# Patient Record
Sex: Female | Born: 1955 | Race: Black or African American | Hispanic: No | Marital: Single | Smoking: Current every day smoker
Health system: Southern US, Community
[De-identification: ages and names within clinical notes are randomized; demographics above are authoritative.]

## PROBLEM LIST (undated history)

## (undated) DIAGNOSIS — I10 Essential (primary) hypertension: Secondary | ICD-10-CM

## (undated) DIAGNOSIS — E785 Hyperlipidemia, unspecified: Secondary | ICD-10-CM

## (undated) DIAGNOSIS — E669 Obesity, unspecified: Secondary | ICD-10-CM

## (undated) DIAGNOSIS — E119 Type 2 diabetes mellitus without complications: Secondary | ICD-10-CM

## (undated) HISTORY — PX: ABDOMINAL HYSTERECTOMY: SHX81

## (undated) HISTORY — DX: Hyperlipidemia, unspecified: E78.5

## (undated) HISTORY — DX: Essential (primary) hypertension: I10

## (undated) HISTORY — DX: Obesity, unspecified: E66.9

## (undated) HISTORY — DX: Type 2 diabetes mellitus without complications: E11.9

---

## 2007-01-21 ENCOUNTER — Emergency Department (HOSPITAL_COMMUNITY): Admission: EM | Admit: 2007-01-21 | Discharge: 2007-01-21 | Payer: Self-pay | Admitting: Emergency Medicine

## 2007-12-22 ENCOUNTER — Emergency Department (HOSPITAL_COMMUNITY): Admission: EM | Admit: 2007-12-22 | Discharge: 2007-12-22 | Payer: Self-pay | Admitting: Family Medicine

## 2011-01-03 ENCOUNTER — Inpatient Hospital Stay (INDEPENDENT_AMBULATORY_CARE_PROVIDER_SITE_OTHER)
Admission: RE | Admit: 2011-01-03 | Discharge: 2011-01-03 | Disposition: A | Discharge status: Home / Self Care | Payer: Self-pay | Source: Ambulatory Visit | Attending: Family Medicine | Admitting: Family Medicine

## 2011-01-03 DIAGNOSIS — I10 Essential (primary) hypertension: Secondary | ICD-10-CM

## 2011-01-03 DIAGNOSIS — S139XXA Sprain of joints and ligaments of unspecified parts of neck, initial encounter: Secondary | ICD-10-CM

## 2011-01-03 DIAGNOSIS — G5600 Carpal tunnel syndrome, unspecified upper limb: Secondary | ICD-10-CM

## 2013-04-22 ENCOUNTER — Other Ambulatory Visit: Payer: Self-pay

## 2013-04-22 DIAGNOSIS — Z1231 Encounter for screening mammogram for malignant neoplasm of breast: Secondary | ICD-10-CM

## 2013-05-25 ENCOUNTER — Ambulatory Visit
Admission: RE | Admit: 2013-05-25 | Discharge: 2013-05-25 | Disposition: A | Discharge status: Home / Self Care | Payer: BC Managed Care – PPO | Source: Ambulatory Visit

## 2013-05-25 DIAGNOSIS — Z1231 Encounter for screening mammogram for malignant neoplasm of breast: Secondary | ICD-10-CM

## 2013-12-25 ENCOUNTER — Ambulatory Visit: Payer: BC Managed Care – PPO | Admitting: Dietician

## 2014-02-01 ENCOUNTER — Ambulatory Visit: Payer: BC Managed Care – PPO | Admitting: *Deleted

## 2014-03-08 ENCOUNTER — Encounter: Payer: BC Managed Care – PPO | Attending: Internal Medicine | Admitting: *Deleted

## 2014-03-08 ENCOUNTER — Encounter: Payer: Self-pay | Admitting: *Deleted

## 2014-03-08 VITALS — Ht 62.0 in | Wt 267.2 lb

## 2014-03-08 DIAGNOSIS — E1165 Type 2 diabetes mellitus with hyperglycemia: Secondary | ICD-10-CM | POA: Diagnosis present

## 2014-03-08 DIAGNOSIS — E118 Type 2 diabetes mellitus with unspecified complications: Secondary | ICD-10-CM

## 2014-03-08 DIAGNOSIS — Z713 Dietary counseling and surveillance: Secondary | ICD-10-CM | POA: Diagnosis not present

## 2014-03-08 DIAGNOSIS — IMO0002 Reserved for concepts with insufficient information to code with codable children: Secondary | ICD-10-CM

## 2014-03-08 NOTE — Progress Notes (Deleted)
Subjective:     Patient ID: Denise Santiago, female   DOB: 06/12/1955, 58 y.o.   MRN: [id number]  HPI   Review of Systems     Objective:   Physical Exam     Assessment:     ***    Plan:     ***

## 2014-03-08 NOTE — Patient Instructions (Signed)
Plan:  Aim for 3 Carb Choices per meal (45 grams) +/- 1 either way  Aim for 0-2 Carbs per snack if hungry  Include protein in moderation with your meals and snacks Consider reading food labels for Total Carbohydrate of foods Continue with your activity level by walking and taking the stairs daily as tolerated Consider checking BG at alternate times per day   Consider taking medication as directed by MD

## 2014-03-08 NOTE — Progress Notes (Signed)
Diabetes Self-Management Education  Visit Type:  initial visit  Appt. Start Time: 1600 Appt. End Time: 1730  03/08/2014  [name], identified by name and date of birth, is a 58 y.o. female with a diagnosis of Diabetes: Type 2.  Other people present during visit:  Patient   ASSESSMENT  Height 5\' 2"  (1.575 m), weight 267 lb 3.2 oz (121.201 kg). Body mass index is 48.86 kg/(m^2).  Initial Visit Information:  Are you currently following a meal plan?: No   Are you taking your medications as prescribed?: No (she occasionally forgets OR may not take due to diarrhea) Are you checking your feet?: No   How often do you need to have someone help you when you read instructions, pamphlets, or other written materials from your doctor or pharmacy?: 1 - Never What is the last grade level you completed in school?: college  Psychosocial:     Patient Belief/Attitude about Diabetes: Denial Self-care barriers: None Self-management support: Doctor's office, Family Other persons present: Patient Patient Concerns: Nutrition/Meal planning, Medication, Monitoring, Weight Control Special Needs: None Preferred Learning Style: Visual, Hands on Learning Readiness: Ready  Complications:   Last HgB A1C per patient/outside source: 8.3 % How often do you check your blood sugar?: Patient declines Number of hypoglycemic episodes per month: 0 Have you had a dilated eye exam in the past 12 months?: Yes (glaucoma) Have you had a dental exam in the past 12 months?: No  Diet Intake:  Breakfast: skips Snack (morning): no Lunch: buys sit down or fast sub or chinese meal, regular Coke Snack (afternoon): varies- Cheese or PNB Crackers  Dinner: take out often: meat, starches, starchy vegetables, occasionally a green vegetable, regular Coke Snack (evening): tries not to after 9 PM Beverage(s): regular Coke  Exercise:  Exercise:  (walks and takes the stairs daily at [employer] she works  at) [employer] Exercise amount of time (min / week): 150  Individualized Plan for Diabetes Self-Management Training:   Learning Objective:  Patient will have a greater understanding of diabetes self-management.  Patient education plan per assessed needs and concerns is to attend individual sessions for     Education Topics Reviewed with Patient Today:  Definition of diabetes, type 1 and 2, and the diagnosis of diabetes, Explored patient's options for treatment of their diabetes Role of diet in the treatment of diabetes and the relationship between the three main macronutrients and blood glucose level, Food label reading, portion sizes and measuring food., Carbohydrate counting, Reviewed blood glucose goals for pre and post meals and how to evaluate the patients' food intake on their blood glucose level. Role of exercise on diabetes management, blood pressure control and cardiac health. Reviewed patients medication for diabetes, action, purpose, timing of dose and side effects. Identified appropriate SMBG and/or A1C goals. Taught treatment of hypoglycemia - the 15 rule. Reviewed with patient heart disease, higher risk of, and prevention Role of stress on diabetes   Review risk of smoking and offered smoking cessation (Declined, wants to work on Diabetes first)  PATIENTS GOALS/Plan (Developed by the patient):  Nutrition: Follow meal plan discussed Physical Activity: Exercise 3-5 times per week Medications: take my medication as prescribed Monitoring : test blood glucose pre and post meals as discussed Health Coping: discuss diabetes with (comment) (include family members as opportunity for support)  Plan:   Patient Instructions  Plan:  Aim for 3 Carb Choices per meal (45 grams) +/- 1 either way  Aim for 0-2 Carbs per  snack if hungry  Include protein in moderation with your meals and snacks Consider reading food labels for Total Carbohydrate of foods Continue with your activity level by  walking and taking the stairs daily as tolerated Consider checking BG at alternate times per day   Consider taking medication as directed by MD      Expected Outcomes:  Demonstrated interest in learning. Expect positive outcomes  Education material provided: Living Well with Diabetes, A1C conversion sheet, Meal plan card and Carbohydrate counting sheet  If problems or questions, patient to contact team via:  Phone  Future DSME appointment: 2 months

## 2014-08-09 ENCOUNTER — Other Ambulatory Visit: Payer: Self-pay

## 2014-08-09 DIAGNOSIS — Z1231 Encounter for screening mammogram for malignant neoplasm of breast: Secondary | ICD-10-CM

## 2014-08-23 ENCOUNTER — Ambulatory Visit
Admission: RE | Admit: 2014-08-23 | Discharge: 2014-08-23 | Disposition: A | Discharge status: Home / Self Care | Payer: BLUE CROSS/BLUE SHIELD | Source: Ambulatory Visit | Attending: Nurse Practitioner | Admitting: Nurse Practitioner

## 2014-08-23 ENCOUNTER — Other Ambulatory Visit: Payer: Self-pay | Admitting: Nurse Practitioner

## 2014-08-23 DIAGNOSIS — M7501 Adhesive capsulitis of right shoulder: Secondary | ICD-10-CM

## 2014-09-09 ENCOUNTER — Other Ambulatory Visit: Payer: Self-pay | Admitting: Orthopedic Surgery

## 2014-09-09 DIAGNOSIS — M25511 Pain in right shoulder: Secondary | ICD-10-CM

## 2014-09-13 ENCOUNTER — Other Ambulatory Visit: Payer: Self-pay

## 2014-09-13 ENCOUNTER — Ambulatory Visit
Admission: RE | Admit: 2014-09-13 | Discharge: 2014-09-13 | Disposition: A | Discharge status: Home / Self Care | Payer: BLUE CROSS/BLUE SHIELD | Source: Ambulatory Visit

## 2014-09-13 DIAGNOSIS — Z1231 Encounter for screening mammogram for malignant neoplasm of breast: Secondary | ICD-10-CM

## 2014-09-25 ENCOUNTER — Ambulatory Visit
Admission: RE | Admit: 2014-09-25 | Discharge: 2014-09-25 | Disposition: A | Discharge status: Home / Self Care | Payer: BLUE CROSS/BLUE SHIELD | Source: Ambulatory Visit | Attending: Orthopedic Surgery | Admitting: Orthopedic Surgery

## 2014-09-25 DIAGNOSIS — M25511 Pain in right shoulder: Secondary | ICD-10-CM

## 2016-02-02 ENCOUNTER — Encounter (HOSPITAL_COMMUNITY): Payer: Self-pay | Admitting: Family Medicine

## 2016-02-02 ENCOUNTER — Ambulatory Visit (HOSPITAL_COMMUNITY)
Admission: EM | Admit: 2016-02-02 | Discharge: 2016-02-02 | Disposition: A | Discharge status: Home / Self Care | Payer: BLUE CROSS/BLUE SHIELD | Attending: Emergency Medicine | Admitting: Emergency Medicine

## 2016-02-02 DIAGNOSIS — J302 Other seasonal allergic rhinitis: Secondary | ICD-10-CM

## 2016-02-02 DIAGNOSIS — Z720 Tobacco use: Secondary | ICD-10-CM | POA: Diagnosis not present

## 2016-02-02 DIAGNOSIS — I10 Essential (primary) hypertension: Secondary | ICD-10-CM | POA: Diagnosis not present

## 2016-02-02 DIAGNOSIS — J9801 Acute bronchospasm: Secondary | ICD-10-CM

## 2016-02-02 MED ORDER — ALBUTEROL SULFATE (2.5 MG/3ML) 0.083% IN NEBU
2.5000 mg | INHALATION_SOLUTION | Freq: Once | RESPIRATORY_TRACT | Status: AC
  Administered 2016-02-02: 2.5 mg via RESPIRATORY_TRACT

## 2016-02-02 MED ORDER — PHENYLEPHRINE-CHLORPHEN-DM 10-4-12.5 MG/5ML PO LIQD: ORAL | 0 refills | Status: DC

## 2016-02-02 MED ORDER — IPRATROPIUM-ALBUTEROL 0.5-2.5 (3) MG/3ML IN SOLN
3.0000 mL | Freq: Once | RESPIRATORY_TRACT | Status: AC
  Administered 2016-02-02: 3 mL via RESPIRATORY_TRACT

## 2016-02-02 MED ORDER — IPRATROPIUM-ALBUTEROL 0.5-2.5 (3) MG/3ML IN SOLN
RESPIRATORY_TRACT | Status: AC
  Filled 2016-02-02: qty 3

## 2016-02-02 MED ORDER — DEXAMETHASONE SODIUM PHOSPHATE 10 MG/ML IJ SOLN
10.0000 mg | Freq: Once | INTRAMUSCULAR | Status: AC
  Administered 2016-02-02: 10 mg via INTRAMUSCULAR

## 2016-02-02 MED ORDER — ALBUTEROL SULFATE HFA 108 (90 BASE) MCG/ACT IN AERS: 2.0000 | INHALATION_SPRAY | RESPIRATORY_TRACT | 0 refills | Status: DC | PRN

## 2016-02-02 MED ORDER — ALBUTEROL SULFATE (2.5 MG/3ML) 0.083% IN NEBU
INHALATION_SOLUTION | RESPIRATORY_TRACT | Status: AC
  Filled 2016-02-02: qty 3

## 2016-02-02 MED ORDER — DEXAMETHASONE 10 MG/ML FOR PEDIATRIC ORAL USE
INTRAMUSCULAR | Status: AC
  Filled 2016-02-02: qty 1

## 2016-02-02 MED ORDER — PREDNISONE 20 MG PO TABS: ORAL_TABLET | ORAL | 0 refills | Status: DC

## 2016-02-02 NOTE — ED Triage Notes (Signed)
Pt here for cough, congestion, mucous and SOB x 1 week. sts she has ben coughing up copious amounts of mucous. sts using inhaler with some relief.

## 2016-02-02 NOTE — ED Provider Notes (Signed)
CSN: [id number]     Arrival date & time 02/02/16  1833 History   First MD Initiated Contact with Patient 02/02/16 1940     Chief Complaint  Patient presents with  . Cough  . Nasal Congestion   (Consider location/radiation/quality/duration/timing/severity/associated sxs/prior Treatment) 60 year old female who smokes one to one and half packs per day for 45 years presents to the urgent care with a wheezy cough, shortness of breath, chest congestion, stuffy nose, rhinorrhea and nasal congestion. Denies known fever. She is morbidly obese and has hypertension and non-insulin-dependent diabetes.      Past Medical History:  Diagnosis Date  . Diabetes mellitus without complication (HCC)   . Hyperlipidemia   . Hypertension   . Obesity    Past Surgical History:  Procedure Laterality Date  . ABDOMINAL HYSTERECTOMY     History reviewed. No pertinent family history. Social History  Substance Use Topics  . Smoking status: Current Every Day Smoker    Packs/day: 1.00    Types: Cigarettes  . Smokeless tobacco: Never Used  . Alcohol use 0.0 oz/week     Comment: red wine per MD suggestion to lower cholesterol   OB History    No data available     Review of Systems  Constitutional: Positive for activity change. Negative for fever.  HENT: Positive for congestion, postnasal drip, rhinorrhea, sinus pressure and sore throat. Negative for ear pain and trouble swallowing.   Respiratory: Positive for cough, shortness of breath and wheezing. Negative for chest tightness.   Cardiovascular: Negative for chest pain.  Gastrointestinal: Negative.   Neurological: Negative.   All other systems reviewed and are negative.   Allergies  Sulfur  Home Medications   Prior to Admission medications   Medication Sig Start Date End Date Taking? Authorizing Provider  albuterol (PROVENTIL HFA;VENTOLIN HFA) 108 (90 Base) MCG/ACT inhaler Inhale 2 puffs into the lungs every 4 (four) hours as needed for  wheezing or shortness of breath. 02/02/16   Hayden Rasmussen, NP  aspirin 81 MG tablet Take 81 mg by mouth daily.    Historical Provider, MD  atorvastatin (LIPITOR) 10 MG tablet Take 10 mg by mouth daily.    Historical Provider, MD  citalopram (CELEXA) 20 MG tablet Take 20 mg by mouth daily.    Historical Provider, MD  latanoprost (XALATAN) 0.005 % ophthalmic solution Place 1 drop into both eyes at bedtime.    Historical Provider, MD  lisinopril-hydrochlorothiazide (PRINZIDE,ZESTORETIC) 20-12.5 MG per tablet Take 1 tablet by mouth daily.    Historical Provider, MD  metFORMIN (GLUCOPHAGE) 850 MG tablet Take 850 mg by mouth 2 (two) times daily with a meal.    Historical Provider, MD  Phenylephrine-Chlorphen-DM 02-08-11.5 MG/5ML LIQD 1/2 to 1 tsp po q 4 hours prn drainage and congestion 02/02/16   Hayden Rasmussen, NP  predniSONE (DELTASONE) 20 MG tablet Take 3 tabs po on first day, 2 tabs second day, 2 tabs third day, 1 tab fourth day, 1 tab 5th day. Take with food. 02/02/16   Hayden Rasmussen, NP  torsemide (DEMADEX) 20 MG tablet Take 20 mg by mouth daily as needed.    Historical Provider, MD  vitamin B-12 (CYANOCOBALAMIN) 50 MCG tablet Take 50 mcg by mouth daily.    Historical Provider, MD   Meds Ordered and Administered this Visit   Medications  albuterol (PROVENTIL) (2.5 MG/3ML) 0.083% nebulizer solution 2.5 mg (2.5 mg Nebulization Given 02/02/16 2009)  ipratropium-albuterol (DUONEB) 0.5-2.5 (3) MG/3ML nebulizer solution 3 mL (3 mLs Nebulization Given  02/02/16 2009)  dexamethasone (DECADRON) injection 10 mg (10 mg Intramuscular Given 02/02/16 2009)    BP (!) 201/73 (BP Location: Left Arm) Comment: notified rn  Pulse 78   Temp 98.5 F (36.9 C) (Oral)   Resp 14   SpO2 97%  No data found.   Physical Exam  Constitutional: She is oriented to person, place, and time. She appears well-developed and well-nourished.  HENT:  Head: Normocephalic and atraumatic.  Bilateral TMs are mildly retracted. Unable to  visualize oropharynx due to patient's untenable retraction of the tongue.  Neck: Normal range of motion. Neck supple.  Cardiovascular: Normal rate, regular rhythm, normal heart sounds and intact distal pulses.   Pulmonary/Chest: She has wheezes.  Mildly increased respiratory effort. Inspiratory and expiratory wheezes. Decreased air movement bilaterally. Prolonged expiratory phase.  Lymphadenopathy:    She has no cervical adenopathy.  Neurological: She is alert and oriented to person, place, and time.  Skin: Skin is warm and dry.  Psychiatric: She has a normal mood and affect.  Nursing note and vitals reviewed.   Urgent Care Course   Clinical Course    Procedures (including critical care time)  Labs Review Labs Reviewed - No data to display  Imaging Review No results found.   Visual Acuity Review  Right Eye Distance:   Left Eye Distance:   Bilateral Distance:    Right Eye Near:   Left Eye Near:    Bilateral Near:         MDM   1. Other seasonal allergic rhinitis   2. Bronchospasm   3. Cough due to bronchospasm   4. Tobacco abuse disorder   5. Essential hypertension    Substantial improvement in the way the patient feels   post DuoNeb. lungs with improved air movement, significant decrease in wheezing and cough.  Continue to use lots of nasal saline spray. Stop smoking. Take the liquid congestion and drainage medicines as directed. Use the albuterol HFA inhaler 2 puffs every 4 hours as needed for cough and wheezing. If you develop fever, increased shortness of breath or worsening sick medical attention promptly. Recommend following up with your primary care provider next week for recheck and also for blood pressure evaluation. Meds ordered this encounter  Medications  . albuterol (PROVENTIL) (2.5 MG/3ML) 0.083% nebulizer solution 2.5 mg  . ipratropium-albuterol (DUONEB) 0.5-2.5 (3) MG/3ML nebulizer solution 3 mL  . dexamethasone (DECADRON) injection 10 mg  .  albuterol (PROVENTIL HFA;VENTOLIN HFA) 108 (90 Base) MCG/ACT inhaler    Sig: Inhale 2 puffs into the lungs every 4 (four) hours as needed for wheezing or shortness of breath.    Dispense:  1 Inhaler    Refill:  0    Order Specific Question:   Supervising Provider    Answer:   Domenick Gong [[id number]]  . predniSONE (DELTASONE) 20 MG tablet    Sig: Take 3 tabs po on first day, 2 tabs second day, 2 tabs third day, 1 tab fourth day, 1 tab 5th day. Take with food.    Dispense:  9 tablet    Refill:  0    Order Specific Question:   Supervising Provider    Answer:   Domenick Gong [[id number]]  . Phenylephrine-Chlorphen-DM 02-08-11.5 MG/5ML LIQD    Sig: 1/2 to 1 tsp po q 4 hours prn drainage and congestion    Dispense:  120 mL    Refill:  0    Order Specific Question:   Supervising Provider    Answer:  Chaney MallingMORTENSON, ASHLEY [[id number]]      Hayden Rasmussenavid Amber Williard, NP 02/02/16 2038

## 2016-02-02 NOTE — Discharge Instructions (Signed)
Continue to use lots of nasal saline spray. Stop smoking. Take the liquid congestion and drainage medicines as directed. Use the albuterol HFA inhaler 2 puffs every 4 hours as needed for cough and wheezing. If you develop fever, increased shortness of breath or worsening sick medical attention promptly. Recommend following up with your primary care provider next week for recheck and also for blood pressure evaluation.

## 2016-03-19 IMAGING — MR MR SHOULDER*R* W/O CM
5 series · 34 of 40 positions shown · non-contrast
Comparison: 08/23/2014

CLINICAL DATA: Worsening right shoulder pain after a fall 2 months
ago.

EXAM:
MRI OF THE RIGHT SHOULDER WITHOUT CONTRAST
TECHNIQUE: Multiplanar, multisequence MR imaging of the shoulder was performed.
No intravenous contrast was administered.

[Series 3: T2 fat-sat · axial · 4.0mm · 0.55mm/px · z∈[-33,+41]mm · 9 of 17 slices shown (1 of 3)]
[im 1/17]
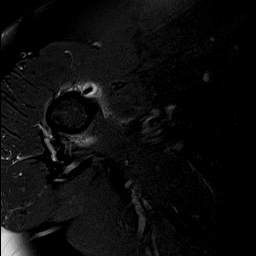
[im 3/17]
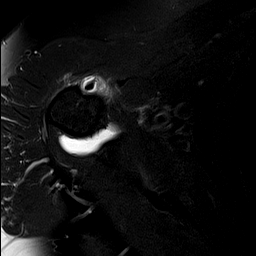
[im 5/17]
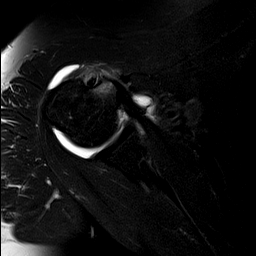
[im 7/17]
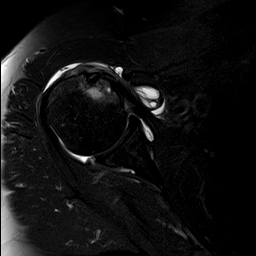
[im 9/17]
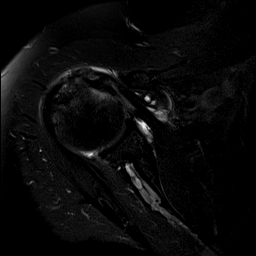
[im 11/17]
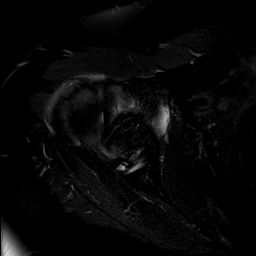
[im 13/17]
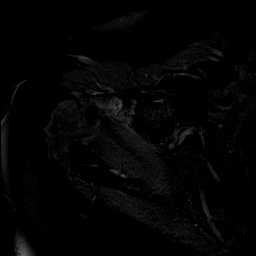
[im 15/17]
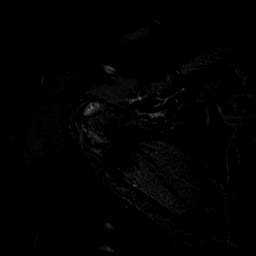
[im 17/17]
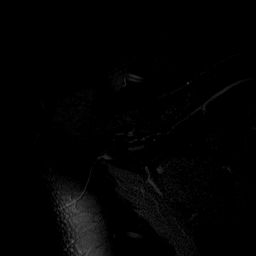

[Series 4: T2 fat-sat · oblique · 4.0mm · 0.55mm/px · 8 of 18 slices shown (2 of 3)]
[im 1/18]
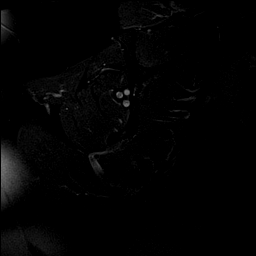
[im 3/18]
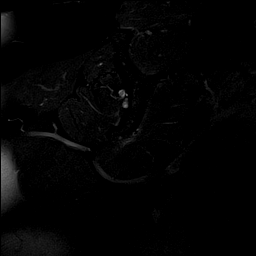
[im 5/18]
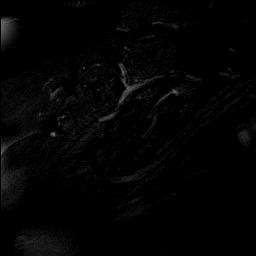
[im 8/18]
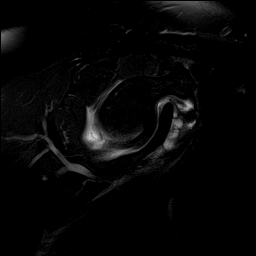
[im 10/18]
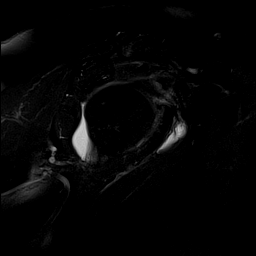
[im 13/18]
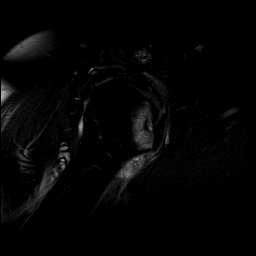
[im 15/18]
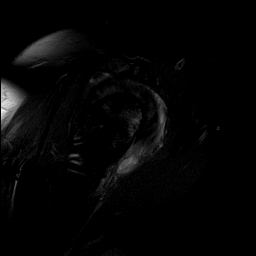
[im 18/18]
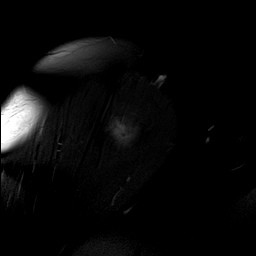

[Series 5: T1 · oblique · 4.0mm · 0.22mm/px · 2 of 18 slices shown]
[im 1/18]
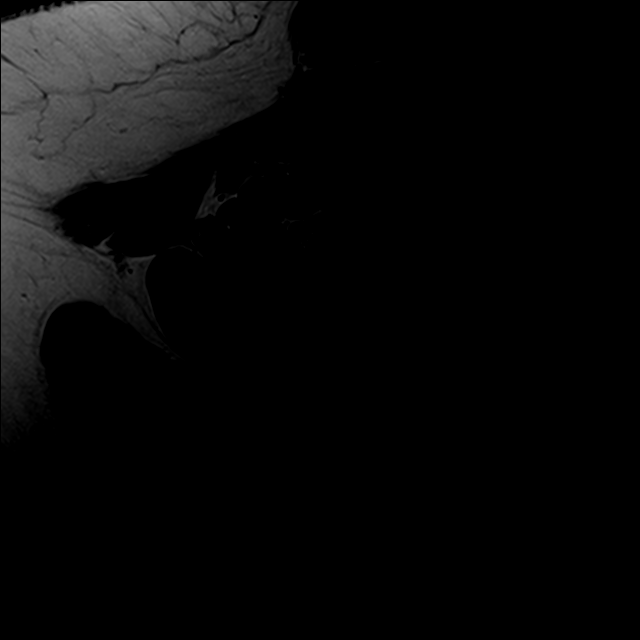
[im 3/18]
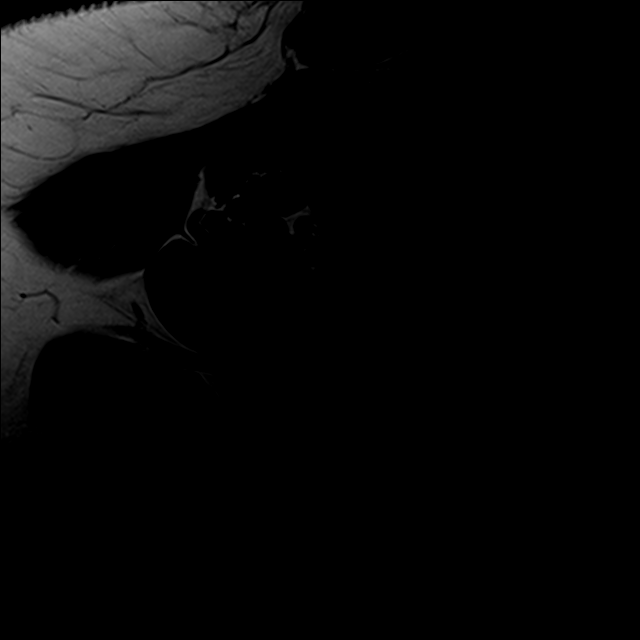

[Series 6: T2 fat-sat · oblique · 4.0mm · 0.27mm/px · 8 of 17 slices shown (3 of 3)]
[im 1/17]
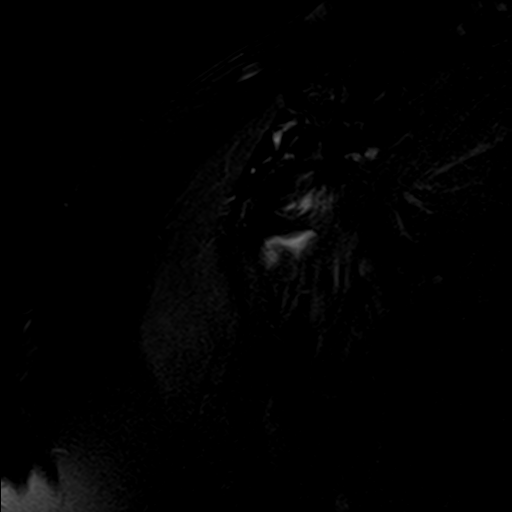
[im 3/17]
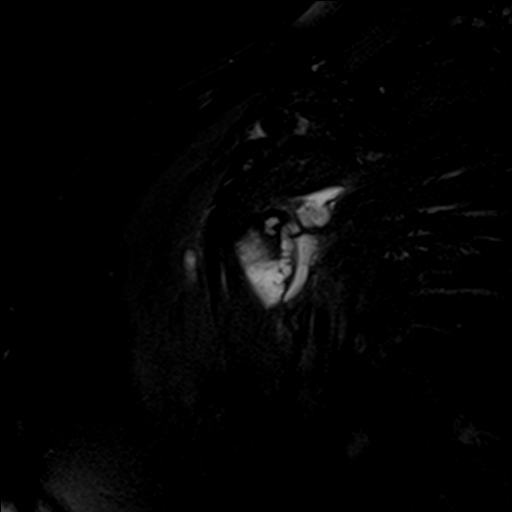
[im 5/17]
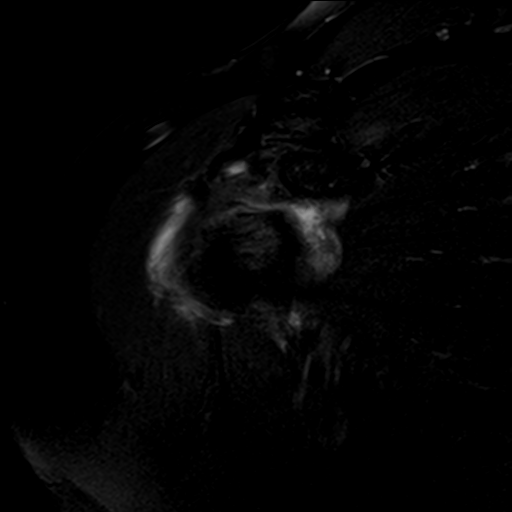
[im 7/17]
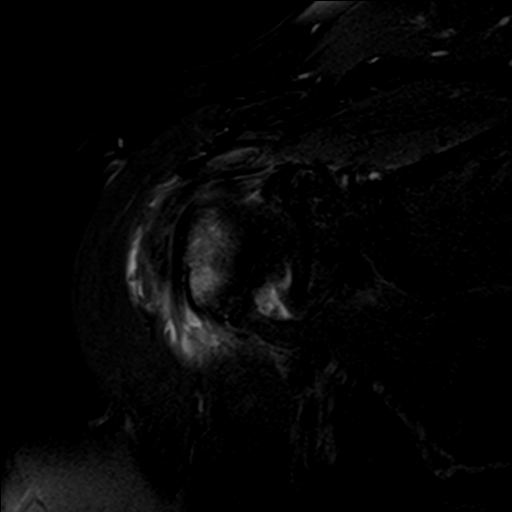
[im 10/17]
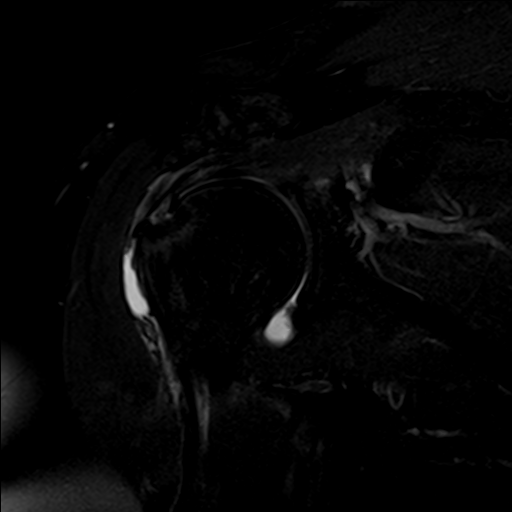
[im 12/17]
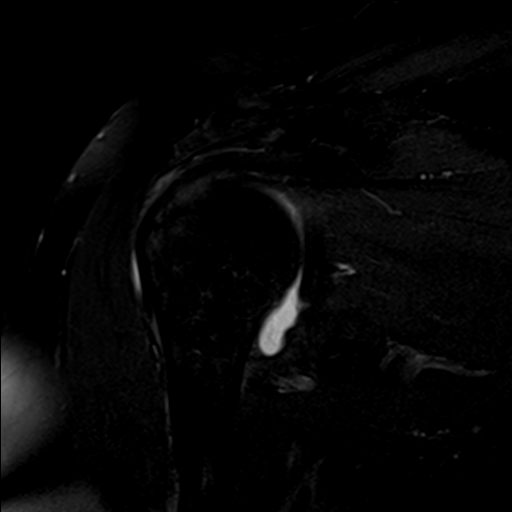
[im 14/17]
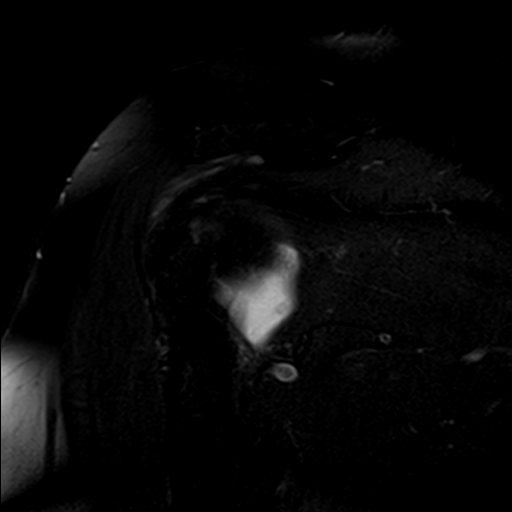
[im 17/17]
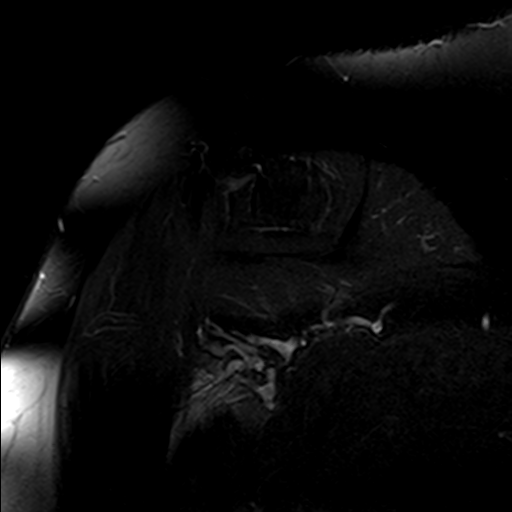

[Series 7: PD · oblique · 4.0mm · 0.44mm/px · 7 of 15 slices shown]
[im 1/15]
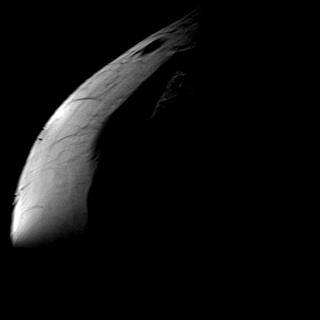
[im 3/15]
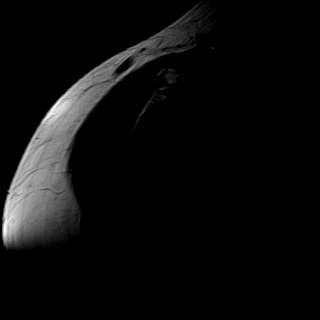
[im 5/15]
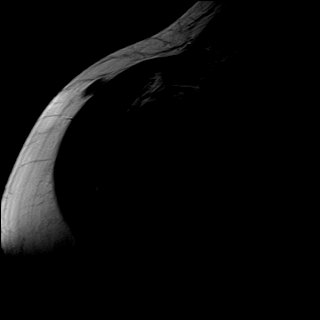
[im 8/15]
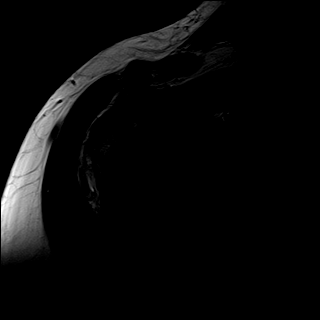
[im 10/15]
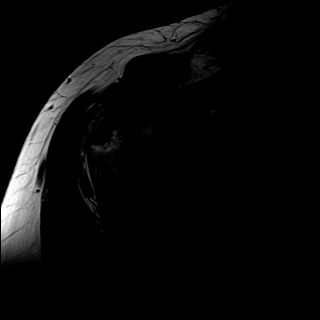
[im 12/15]
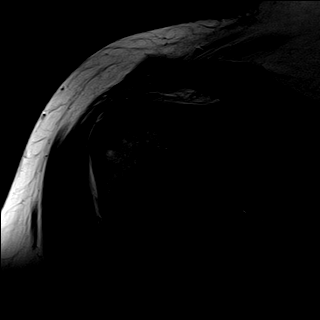
[im 15/15]
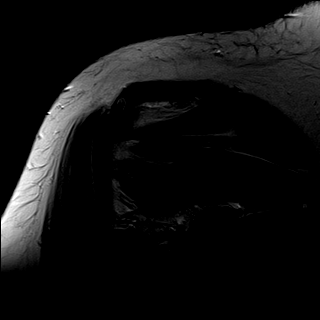

[34 of 40 positions shown; findings below may reference images not displayed]

FINDINGS: Rotator cuff: Moderate supraspinatus and subscapularis tendinopathy
with mild infraspinatus tendinopathy.

Muscles:  Unremarkable

Biceps long head: Moderate tendinopathy or partial tearing of the
intra-articular segment long head of the biceps, image 12 series 6.

Acromioclavicular Joint: Mild degenerative AC joint arthropathy.
Trace fluid in the subacromial subdeltoid bursa. Laterally
downsloping acromion may predispose to impingement.

Glenohumeral Joint: Moderate joint effusion. Mild chondral thinning
along the humeral head.

Labrum:  Unremarkable

Bones:  Unremarkable
IMPRESSION: 1. Moderate supraspinatus and subscapularis tendinopathy with mild
infraspinatus tendinopathy, but no full-thickness rotator cuff tear.
2. Moderate tendinopathy or partial tearing of the intra-articular
segment of the long head of the biceps.
3. Moderate glenohumeral joint effusion with mild chondral thinning
along the humeral head.
4. Trace subacromial subdeltoid bursitis.
5. Mild degenerative AC joint arthropathy.

## 2018-12-06 DIAGNOSIS — I639 Cerebral infarction, unspecified: Secondary | ICD-10-CM

## 2018-12-06 HISTORY — DX: Cerebral infarction, unspecified: I63.9

## 2018-12-08 ENCOUNTER — Encounter: Payer: Self-pay | Admitting: *Deleted

## 2018-12-08 ENCOUNTER — Other Ambulatory Visit: Payer: Self-pay

## 2018-12-08 ENCOUNTER — Emergency Department
Admission: EM | Admit: 2018-12-08 | Discharge: 2018-12-08 | Discharge status: Against Medical Advice | Payer: BLUE CROSS/BLUE SHIELD | Attending: Emergency Medicine | Admitting: Emergency Medicine

## 2018-12-08 ENCOUNTER — Encounter: Payer: Self-pay | Admitting: Emergency Medicine

## 2018-12-08 ENCOUNTER — Ambulatory Visit
Admission: EM | Admit: 2018-12-08 | Discharge: 2018-12-08 | Disposition: A | Discharge status: Other Acute Inpatient Hospital | Payer: BLUE CROSS/BLUE SHIELD | Attending: Emergency Medicine | Admitting: Emergency Medicine

## 2018-12-08 DIAGNOSIS — R51 Headache: Secondary | ICD-10-CM

## 2018-12-08 DIAGNOSIS — R519 Headache, unspecified: Secondary | ICD-10-CM

## 2018-12-08 DIAGNOSIS — I10 Essential (primary) hypertension: Secondary | ICD-10-CM

## 2018-12-08 DIAGNOSIS — H539 Unspecified visual disturbance: Secondary | ICD-10-CM

## 2018-12-08 DIAGNOSIS — Z5321 Procedure and treatment not carried out due to patient leaving prior to being seen by health care provider: Secondary | ICD-10-CM | POA: Insufficient documentation

## 2018-12-08 NOTE — ED Notes (Signed)
Report given to Jonelle Sidle, RN at [facility] ED.

## 2018-12-08 NOTE — ED Notes (Signed)
No answer when called several times from lobby 

## 2018-12-08 NOTE — Discharge Instructions (Addendum)
Go to the [facility] ER soon as you possibly can.  We have called them and let them know that you are on your way.  Take your blood pressure cuff with you to the ER.  Call 911 if your headache returns, strokelike symptoms, chest pain, shortness of breath, or for any other signs and symptoms that we discussed.

## 2018-12-08 NOTE — ED Triage Notes (Addendum)
Pt reporting severe headaches Thursday and Friday of last week. Upon waking. on Saturday pt noted she had trouble reading and felt as though her vision was "darker" than normal. Pt went to UC and was sent to ED for r/o of an aneurysm. Pt denies having a headache at this time and reports her reading is getting "better" But she is still not at her baseline. No obvious neuro deficits at this time. Pt denies blood thinner use.   HTN at [facility] UC pt reports she has not been compliant with BP medications.

## 2018-12-08 NOTE — ED Triage Notes (Addendum)
Pt ambulatory to treatment room, reports "I think I might've had a stroke". Reports nausea, minor headache. Reports "horrible headaches" Thursday and Friday night, took tylenol with some relief. Reports Saturday she started having difficulty reading, "darker vision", pt does wear glasses. Pt is +smoker, hx of DM2, HTN; has taken herself off medications.

## 2018-12-08 NOTE — ED Provider Notes (Signed)
HPI  SUBJECTIVE:  Denise Santiago is a 63 y.o. female who reports a gradual onset throbbing right-sided headache at night on Thursday and Friday.  States that they were the worst headaches that she has ever had.  Lasted hours and then resolved.  She did not measure her blood pressure while she was having these headaches.  She tried Tylenol with some improvement in her headache.  No aggravating factors.  She notes that she had "darker vision" in both eyes starting on Saturday  It is constant.  She notes difficulty reading.  She denies blurry vision, double vision.  She wears glasses, but this is a relatively new prescription.  She has an appointment with her eye doctor tomorrow.  No slurred speech, facial droop, arm or leg weakness, discoordination.  No neck stiffness, fevers, purulent nasal discharge, sinus pain or pressure, dental pain, photophobia, phonophobia.  She was watching TV while these headaches were occurring.  She was not exerting herself.  No nausea, vomiting, seizures, syncope.  Past medical history negative for headaches, migraines, stroke, atrial fibrillation, aneurysm, temporal arteritis.  She has a history of diabetes, hypertension, smoking.  She has not taken her blood pressure or diabetic medication in over a year after a 100 pound weight loss.  Will take her torsemide intermittently.  She checks her blood pressure and glucose once a month.  She is not sure if her home blood pressure cuff is accurate.  Family history significant for mother with a stroke.  PMD: PCP    Past Medical History:  Diagnosis Date  . Diabetes mellitus without complication ([place])   . Hyperlipidemia   . Hypertension   . Obesity     Past Surgical History:  Procedure Laterality Date  . ABDOMINAL HYSTERECTOMY      No family history on file.  Social History   Tobacco Use  . Smoking status: Current Every Day Smoker    Packs/day: 1.00    Types: Cigarettes  . Smokeless tobacco: Never Used  Substance  Use Topics  . Alcohol use: Yes    Alcohol/week: 0.0 standard drinks    Comment: red wine per MD suggestion to lower cholesterol  . Drug use: Not on file    No current facility-administered medications for this encounter.   Current Outpatient Medications:  .  citalopram (CELEXA) 20 MG tablet, Take 20 mg by mouth daily., Disp: , Rfl:  .  torsemide (DEMADEX) 20 MG tablet, Take 20 mg by mouth daily as needed., Disp: , Rfl:  .  albuterol (PROVENTIL HFA;VENTOLIN HFA) 108 (90 Base) MCG/ACT inhaler, Inhale 2 puffs into the lungs every 4 (four) hours as needed for wheezing or shortness of breath., Disp: 1 Inhaler, Rfl: 0 .  aspirin 81 MG tablet, Take 81 mg by mouth daily., Disp: , Rfl:  .  atorvastatin (LIPITOR) 10 MG tablet, Take 10 mg by mouth daily., Disp: , Rfl:  .  latanoprost (XALATAN) 0.005 % ophthalmic solution, Place 1 drop into both eyes at bedtime., Disp: , Rfl:  .  lisinopril-hydrochlorothiazide (PRINZIDE,ZESTORETIC) 20-12.5 MG per tablet, Take 1 tablet by mouth daily., Disp: , Rfl:  .  metFORMIN (GLUCOPHAGE) 850 MG tablet, Take 850 mg by mouth 2 (two) times daily with a meal., Disp: , Rfl:  .  vitamin B-12 (CYANOCOBALAMIN) 50 MCG tablet, Take 50 mcg by mouth daily., Disp: , Rfl:   Allergies  Allergen Reactions  . Sulfur      ROS  As noted in HPI.   Physical Exam  BP (!) 234/97 (BP Location: Right Arm)   Pulse 80   Temp 98.3 F (36.8 C) (Oral)   Resp (!) 24   Ht 5\' 2"  (1.575 m)   Wt 104 kg   SpO2 100%   BMI 41.94 kg/m   Constitutional: Well developed, well nourished, no acute distress Eyes: PERRL, EOMI, conjunctiva normal bilaterally.  No photophobia.  Vision grossly intact and equal in both eyes HENT: Normocephalic, atraumatic,mucus membranes moist, edentulous upper palate.  No nasal congestion, no sinus tenderness. No temporal artery tenderness.  Neck: no cervical LN +  trapezial muscle tenderness. No meningismus Respiratory: normal inspiratory effort, lungs clear  bilaterally Cardiovascular: Normal rate, regular rhythm, no murmurs rubs or gallops GI:  nondistended skin: No rash, skin intact Musculoskeletal: 1-2+ pitting edema bilateral lower extremities, no tenderness, no deformities Neurologic: Alert & oriented x 3, CN III-XII intact, romberg neg, finger-> nose, heel-> shin equal b/l, Romberg neg, tandem gait steady Psychiatric: Speech and behavior appropriate   ED Course   Medications - No data to display  No orders of the defined types were placed in this encounter.  No results found for this or any previous visit (from the past 24 hour(s)). No results found.   ED Clinical Impression  1. Visual changes   2. Hypertension, unspecified type   3. Acute nonintractable headache, unspecified headache type     ED Assessment/Plan  Blood pressure noted.  Patient is currently asymptomatic except for the visual changes which is atypical for stroke.  She is completely neurologically intact.  She has not had a headache in 2 days which is reassuring.  She denies chest pain or pressure, tearing pain going through to her back, shortness of breath, palpitations, anuria, hematuria.  She has lower extremity edema, but has had this in the past.    However, concern for a leaking aneurysm or malignant hypertension.  Strongly encouraged the patient to go via EMS, but patient declined.  She has her 64 year old father here with her and nobody to pick him up and take him home.  She states that she will drop them off at home and then go immediately to the [facility] ER.  She will take her blood pressure cuff with her.  Nursing staff called charge nurse.  No orders of the defined types were placed in this encounter.   *This clinic note was created using Dragon dictation software. Therefore, there may be occasional mistakes despite careful proofreading.  ?   Domenick GongMortenson, Rennie Hack, MD 12/08/18 1339

## 2018-12-09 ENCOUNTER — Telehealth: Payer: Self-pay | Admitting: Emergency Medicine

## 2018-12-09 NOTE — Telephone Encounter (Signed)
Called patient due to lwot to inquire about condition and follow up plans. Left message.   

## 2018-12-10 ENCOUNTER — Observation Stay: Payer: Self-pay

## 2018-12-10 ENCOUNTER — Emergency Department: Payer: Self-pay

## 2018-12-10 ENCOUNTER — Observation Stay
Admit: 2018-12-10 | Discharge: 2018-12-10 | Disposition: A | Discharge status: Home / Self Care | Payer: Self-pay | Attending: Internal Medicine | Admitting: Internal Medicine

## 2018-12-10 ENCOUNTER — Observation Stay
Admission: EM | Admit: 2018-12-10 | Discharge: 2018-12-11 | Disposition: A | Discharge status: Home / Self Care | Payer: Self-pay | Attending: Internal Medicine | Admitting: Internal Medicine

## 2018-12-10 ENCOUNTER — Other Ambulatory Visit: Payer: Self-pay

## 2018-12-10 ENCOUNTER — Encounter: Payer: Self-pay | Admitting: Emergency Medicine

## 2018-12-10 DIAGNOSIS — E669 Obesity, unspecified: Secondary | ICD-10-CM | POA: Insufficient documentation

## 2018-12-10 DIAGNOSIS — I63511 Cerebral infarction due to unspecified occlusion or stenosis of right middle cerebral artery: Principal | ICD-10-CM | POA: Insufficient documentation

## 2018-12-10 DIAGNOSIS — I639 Cerebral infarction, unspecified: Secondary | ICD-10-CM

## 2018-12-10 DIAGNOSIS — E785 Hyperlipidemia, unspecified: Secondary | ICD-10-CM | POA: Insufficient documentation

## 2018-12-10 DIAGNOSIS — Z7982 Long term (current) use of aspirin: Secondary | ICD-10-CM | POA: Insufficient documentation

## 2018-12-10 DIAGNOSIS — I10 Essential (primary) hypertension: Secondary | ICD-10-CM | POA: Insufficient documentation

## 2018-12-10 DIAGNOSIS — Z6841 Body Mass Index (BMI) 40.0 and over, adult: Secondary | ICD-10-CM | POA: Insufficient documentation

## 2018-12-10 DIAGNOSIS — Z7984 Long term (current) use of oral hypoglycemic drugs: Secondary | ICD-10-CM | POA: Insufficient documentation

## 2018-12-10 DIAGNOSIS — Z20828 Contact with and (suspected) exposure to other viral communicable diseases: Secondary | ICD-10-CM | POA: Insufficient documentation

## 2018-12-10 DIAGNOSIS — Z79899 Other long term (current) drug therapy: Secondary | ICD-10-CM | POA: Insufficient documentation

## 2018-12-10 DIAGNOSIS — Z9114 Patient's other noncompliance with medication regimen: Secondary | ICD-10-CM | POA: Insufficient documentation

## 2018-12-10 DIAGNOSIS — Z9119 Patient's noncompliance with other medical treatment and regimen: Secondary | ICD-10-CM | POA: Insufficient documentation

## 2018-12-10 DIAGNOSIS — F17210 Nicotine dependence, cigarettes, uncomplicated: Secondary | ICD-10-CM | POA: Insufficient documentation

## 2018-12-10 DIAGNOSIS — Z23 Encounter for immunization: Secondary | ICD-10-CM | POA: Insufficient documentation

## 2018-12-10 DIAGNOSIS — E119 Type 2 diabetes mellitus without complications: Secondary | ICD-10-CM | POA: Insufficient documentation

## 2018-12-10 LAB — COMPREHENSIVE METABOLIC PANEL
ALT: 13 U/L (ref 0–44)
AST: 20 U/L (ref 15–41)
Albumin: 4 g/dL (ref 3.5–5.0)
Alkaline Phosphatase: 78 U/L (ref 38–126)
Anion gap: 12 (ref 5–15)
BUN: 14 mg/dL (ref 8–23)
CO2: 30 mmol/L (ref 22–32)
Calcium: 9.5 mg/dL (ref 8.9–10.3)
Chloride: 96 mmol/L — ABNORMAL LOW (ref 98–111)
Creatinine, Ser: 1.16 mg/dL — ABNORMAL HIGH (ref 0.44–1.00)
GFR calc Af Amer: 58 mL/min — ABNORMAL LOW (ref >60)
GFR calc non Af Amer: 50 mL/min — ABNORMAL LOW (ref >60)
Glucose, Bld: 126 mg/dL — ABNORMAL HIGH (ref 70–99)
Potassium: 3.5 mmol/L (ref 3.5–5.1)
Sodium: 138 mmol/L (ref 135–145)
Total Bilirubin: 0.7 mg/dL (ref 0.3–1.2)
Total Protein: 8.1 g/dL (ref 6.5–8.1)

## 2018-12-10 LAB — CBC WITH DIFFERENTIAL/PLATELET
Abs Immature Granulocytes: 0.01 10*3/uL (ref 0.00–0.07)
Basophils Absolute: 0.1 10*3/uL (ref 0.0–0.1)
Basophils Relative: 1 %
Eosinophils Absolute: 0 10*3/uL (ref 0.0–0.5)
Eosinophils Relative: 0 %
HCT: 46.1 % — ABNORMAL HIGH (ref 36.0–46.0)
Hemoglobin: 15.7 g/dL — ABNORMAL HIGH (ref 12.0–15.0)
Immature Granulocytes: 0 %
Lymphocytes Relative: 26 %
Lymphs Abs: 1.3 10*3/uL (ref 0.7–4.0)
MCH: 31.6 pg (ref 26.0–34.0)
MCHC: 34.1 g/dL (ref 30.0–36.0)
MCV: 92.8 fL (ref 80.0–100.0)
Monocytes Absolute: 0.6 10*3/uL (ref 0.1–1.0)
Monocytes Relative: 12 %
Neutro Abs: 3 10*3/uL (ref 1.7–7.7)
Neutrophils Relative %: 61 %
Platelets: 126 10*3/uL — ABNORMAL LOW (ref 150–400)
RBC: 4.97 MIL/uL (ref 3.87–5.11)
RDW: 12.2 % (ref 11.5–15.5)
WBC: 5.1 10*3/uL (ref 4.0–10.5)
nRBC: 0 % (ref 0.0–0.2)

## 2018-12-10 LAB — LIPID PANEL
Cholesterol: 162 mg/dL (ref 0–200)
HDL: 39 mg/dL — ABNORMAL LOW (ref >40)
LDL Cholesterol: 113 mg/dL — ABNORMAL HIGH (ref 0–99)
Total CHOL/HDL Ratio: 4.2 RATIO
Triglycerides: 51 mg/dL (ref <150)
VLDL: 10 mg/dL (ref 0–40)

## 2018-12-10 LAB — HEMOGLOBIN A1C
Hgb A1c MFr Bld: 5.9 % — ABNORMAL HIGH (ref 4.8–5.6)
Hgb A1c MFr Bld: 6 % — ABNORMAL HIGH (ref 4.8–5.6)
Mean Plasma Glucose: 122.63 mg/dL
Mean Plasma Glucose: 125.5 mg/dL

## 2018-12-10 LAB — GLUCOSE, CAPILLARY
Glucose-Capillary: 108 mg/dL — ABNORMAL HIGH (ref 70–99)
Glucose-Capillary: 138 mg/dL — ABNORMAL HIGH (ref 70–99)
Glucose-Capillary: 154 mg/dL — ABNORMAL HIGH (ref 70–99)

## 2018-12-10 LAB — APTT: aPTT: 35 seconds (ref 24–36)

## 2018-12-10 LAB — PROTIME-INR
INR: 1.1 (ref 0.8–1.2)
Prothrombin Time: 13.7 seconds (ref 11.4–15.2)

## 2018-12-10 LAB — SARS CORONAVIRUS 2 (TAT 6-24 HRS): SARS Coronavirus 2: NEGATIVE

## 2018-12-10 MED ORDER — ACETAMINOPHEN 650 MG RE SUPP
650.0000 mg | RECTAL | Status: DC | PRN
  Filled 2018-12-10: qty 1

## 2018-12-10 MED ORDER — INSULIN ASPART 100 UNIT/ML ~~LOC~~ SOLN: 0.0000 [IU] | Freq: Every day | SUBCUTANEOUS | Status: DC

## 2018-12-10 MED ORDER — INSULIN ASPART 100 UNIT/ML ~~LOC~~ SOLN
0.0000 [IU] | Freq: Three times a day (TID) | SUBCUTANEOUS | Status: DC
  Administered 2018-12-11: 13:00:00 2 [IU] via SUBCUTANEOUS
  Administered 2018-12-11: 1 [IU] via SUBCUTANEOUS
  Filled 2018-12-10 (×2): qty 1

## 2018-12-10 MED ORDER — ACETAMINOPHEN 160 MG/5ML PO SOLN
650.0000 mg | ORAL | Status: DC | PRN
  Filled 2018-12-10: qty 20.3

## 2018-12-10 MED ORDER — STROKE: EARLY STAGES OF RECOVERY BOOK
Freq: Once | Status: AC
  Administered 2018-12-10: 15:00:00

## 2018-12-10 MED ORDER — ALBUTEROL SULFATE (2.5 MG/3ML) 0.083% IN NEBU: 2.5000 mg | INHALATION_SOLUTION | RESPIRATORY_TRACT | Status: DC | PRN

## 2018-12-10 MED ORDER — ALBUTEROL SULFATE HFA 108 (90 BASE) MCG/ACT IN AERS: 2.0000 | INHALATION_SPRAY | RESPIRATORY_TRACT | Status: DC | PRN

## 2018-12-10 MED ORDER — CITALOPRAM HYDROBROMIDE 20 MG PO TABS
10.0000 mg | ORAL_TABLET | Freq: Every day | ORAL | Status: DC
  Administered 2018-12-10 – 2018-12-11 (×2): 10 mg via ORAL
  Filled 2018-12-10 (×2): qty 1

## 2018-12-10 MED ORDER — LABETALOL HCL 5 MG/ML IV SOLN
5.0000 mg | Freq: Once | INTRAVENOUS | Status: DC
  Filled 2018-12-10: qty 4

## 2018-12-10 MED ORDER — LATANOPROST 0.005 % OP SOLN
1.0000 [drp] | Freq: Every day | OPHTHALMIC | Status: DC
  Filled 2018-12-10: qty 2.5

## 2018-12-10 MED ORDER — LISINOPRIL 20 MG PO TABS
20.0000 mg | ORAL_TABLET | Freq: Every day | ORAL | Status: DC
  Administered 2018-12-10 – 2018-12-11 (×2): 20 mg via ORAL
  Filled 2018-12-10 (×3): qty 1

## 2018-12-10 MED ORDER — ATORVASTATIN CALCIUM 20 MG PO TABS
10.0000 mg | ORAL_TABLET | Freq: Every day | ORAL | Status: DC
  Administered 2018-12-10 – 2018-12-11 (×2): 10 mg via ORAL
  Filled 2018-12-10 (×2): qty 1

## 2018-12-10 MED ORDER — HYDROCHLOROTHIAZIDE 12.5 MG PO CAPS
12.5000 mg | ORAL_CAPSULE | Freq: Every day | ORAL | Status: DC
  Administered 2018-12-10 – 2018-12-11 (×2): 12.5 mg via ORAL
  Filled 2018-12-10 (×2): qty 1

## 2018-12-10 MED ORDER — LISINOPRIL-HYDROCHLOROTHIAZIDE 20-12.5 MG PO TABS: 1.0000 | ORAL_TABLET | Freq: Every day | ORAL | Status: DC

## 2018-12-10 MED ORDER — PNEUMOCOCCAL VAC POLYVALENT 25 MCG/0.5ML IJ INJ
0.5000 mL | INJECTION | INTRAMUSCULAR | Status: AC
  Administered 2018-12-11: 15:00:00 0.5 mL via INTRAMUSCULAR
  Filled 2018-12-10: qty 0.5

## 2018-12-10 MED ORDER — GADOBUTROL 1 MMOL/ML IV SOLN
10.0000 mL | Freq: Once | INTRAVENOUS | Status: AC | PRN
  Administered 2018-12-10: 15:00:00 10 mL via INTRAVENOUS

## 2018-12-10 MED ORDER — ACETAMINOPHEN 325 MG PO TABS: 650.0000 mg | ORAL_TABLET | ORAL | Status: DC | PRN

## 2018-12-10 MED ORDER — ENOXAPARIN SODIUM 40 MG/0.4ML ~~LOC~~ SOLN: 40.0000 mg | SUBCUTANEOUS | Status: DC

## 2018-12-10 NOTE — Evaluation (Signed)
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 patient was here a few days ago with reported HA, left AMA. PMH to include: DM, HLD, HTN, was not taking her medications.  Clinical Impression  Patient received standing up at door upon entering room. Reports she is upset because she feels like she caused this by not taking her medications. Patient demonstrates independence with transfers, ambulation of 40 feet in room. Strength is 5/5 throughout, no difficulties reported by patient. Patient does not require PT follow up at this time.          Follow Up Recommendations  none    Equipment Recommendations  None recommended by PT    Recommendations for Other Services       Precautions / Restrictions Precautions Precautions: None Restrictions Weight Bearing Restrictions: No      Mobility  Bed Mobility Overal bed mobility: Independent                Transfers Overall transfer level: Independent                  Ambulation/Gait Ambulation/Gait assistance: Independent Gait Distance (Feet): 40 Feet Assistive device: None Gait Pattern/deviations: WFL(Within Functional Limits)        Stairs            Wheelchair Mobility    Modified Rankin (Stroke Patients Only) Modified Rankin (Stroke Patients Only) Pre-Morbid Rankin Score: No symptoms Modified Rankin: No symptoms     Balance Overall balance assessment: Independent                                           Pertinent Vitals/Pain Pain Assessment: No/denies pain    Home Living Family/patient expects to be discharged to:: Private residence Living Arrangements: Alone   Type of Home: House       Home Layout: One level Home Equipment: Cane - single point      Prior Function  Level of Independence: Independent         Comments: patient reports she was not using AD, driving, gets no assistance from others     Hand Dominance   Dominant Hand: Right    Extremity/Trunk Assessment   Upper Extremity Assessment Upper Extremity Assessment: Overall WFL for tasks assessed    Lower Extremity Assessment Lower Extremity Assessment: Overall WFL for tasks assessed    Cervical / Trunk Assessment Cervical / Trunk Assessment: Normal  Communication   Communication: No difficulties  Cognition Arousal/Alertness: Awake/alert Behavior During Therapy: WFL for tasks assessed/performed Overall Cognitive Status: Within Functional Limits for tasks assessed                                        General Comments      Exercises     Assessment/Plan    PT Assessment Patent does not need any further PT services  PT Problem List         PT Treatment Interventions      PT Goals (Current goals can be found in the Care Plan section)  Acute Rehab PT Goals Patient Stated Goal: to return home PT Goal Formulation: With  patient Time For Goal Achievement: 12/13/18 Potential to Achieve Goals: Good    Frequency     Barriers to discharge        Co-evaluation               AM-PAC PT "6 Clicks" Mobility  Outcome Measure Help needed turning from your back to your side while in a flat bed without using bedrails?: None Help needed moving from lying on your back to sitting on the side of a flat bed without using bedrails?: None Help needed moving to and from a bed to a chair (including a wheelchair)?: None Help needed standing up from a chair using your arms (e.g., wheelchair or bedside chair)?: None Help needed to walk in hospital room?: None Help needed climbing 3-5 steps with a railing? : None 6 Click Score: 24    End of Session   Activity Tolerance: Patient tolerated treatment well Patient left: Other (comment)(sitting on side of  bed) Nurse Communication: Mobility status PT Visit Diagnosis: Difficulty in walking, not elsewhere classified (R26.2)    Time: 1450-1505 PT Time Calculation (min) (ACUTE ONLY): 15 min   Charges:   PT Evaluation $PT Eval Low Complexity: 1 Low PT Treatments $Gait Training: 8-22 mins        Makynzie Dobesh, PT, GCS 12/10/18,3:22 PM

## 2018-12-10 NOTE — Plan of Care (Signed)
  Problem: Education: Goal: Knowledge of disease or condition will improve Outcome: Progressing Goal: Knowledge of secondary prevention will improve Outcome: Progressing Goal: Knowledge of patient specific risk factors addressed and post discharge goals established will improve Outcome: Progressing Goal: Individualized Educational Video(s) Outcome: Progressing   Problem: Self-Care: Goal: Verbalization of feelings and concerns over difficulty with self-care will improve Outcome: Progressing Goal: Ability to communicate needs accurately will improve Outcome: Progressing   Problem: Nutrition: Goal: Dietary intake will improve Outcome: Progressing   Problem: Education: Goal: Knowledge of General Education information will improve Description: Including pain rating scale, medication(s)/side effects and non-pharmacologic comfort measures Outcome: Progressing   Problem: Health Behavior/Discharge Planning: Goal: Ability to manage health-related needs will improve Outcome: Progressing   Problem: Clinical Measurements: Goal: Ability to maintain clinical measurements within normal limits will improve Outcome: Progressing Goal: Will remain free from infection Outcome: Progressing Goal: Diagnostic test results will improve Outcome: Progressing Goal: Respiratory complications will improve Outcome: Progressing Goal: Cardiovascular complication will be avoided Outcome: Progressing   Problem: Activity: Goal: Risk for activity intolerance will decrease Outcome: Progressing   Problem: Nutrition: Goal: Adequate nutrition will be maintained Outcome: Progressing   Problem: Coping: Goal: Level of anxiety will decrease Outcome: Progressing   Problem: Elimination: Goal: Will not experience complications related to bowel motility Outcome: Progressing Goal: Will not experience complications related to urinary retention Outcome: Progressing   Problem: Pain Managment: Goal: General  experience of comfort will improve Outcome: Progressing   Problem: Safety: Goal: Ability to remain free from injury will improve Outcome: Progressing   Problem: Skin Integrity: Goal: Risk for impaired skin integrity will decrease Outcome: Progressing

## 2018-12-10 NOTE — ED Triage Notes (Signed)
Pt was in ED last week for headache, pt left AMA from lobby. PT was told by her PCP she needs to come back to have head CT. PT denies any headache at this time. PT ambulatory, NAD noted

## 2018-12-10 NOTE — Progress Notes (Signed)
Pt arrived to unit, AAOx4, VSS, no pain, room air. Educated pt on unit routine and answered questions. Pt given call bell. Bed in lowest position.

## 2018-12-10 NOTE — Progress Notes (Signed)
OT Cancellation Note  Patient Details Name: Denise Santiago MRN: [id number] DOB: 13-Sep-1955   OT Screen:    Reason Eval/Treat Not Completed: OT screened, no needs identified, will sign off. Thank you for the OT consult. Order received and chart reviewed. Upon arrival to pt room, pt seated EOB, and had just finished working with physical therapy. Pt noted to be wearing pants, socks, and shoes which she states she put on herself. Pt able to stand EOB and march in place without LOB or need for assist. She states she has used the room bathroom without assist. Pt appears to be back to baseline functional independence and endorsing symptoms have generally resolved. No skilled OT needs identified. Will sign off. Please re-consult if additional needs arise.   Shara Blazing, M.S., OTR/L Ascom: [phone] 12/10/18, 3:47 PM

## 2018-12-10 NOTE — ED Notes (Signed)
Pt ambulatory to toilet with a steady gait. 

## 2018-12-10 NOTE — Consult Note (Addendum)
Chief Complaint: Right MCA stroke HPI:   63 y/o with HTN, DM, hyperlipidemia presents to ER with HA/difficulties with vision, Patient stated she had 2 episodes of right sided HA on Thursday and Friday for which she rook tylenol that minimally helped. During week end patient started having difficulties with her vision on the right eye. States she could not see the whole sentence and was cut on the right. No loc, no weakness, no trauma, no sz like activity, no speech diasturbance Patient  did not improve , went to see her PCP who send her to ER. Head ct showing: Acute/subacute infarction in the right parietal lobe, probablyseveral days old. This is consistent with right MCA branch vesselinfarction. Petechial blood products without frank hematoma. 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 Chronicsmall-vessel ischemic changes of the cerebral hemispheric whiteMatter. BP in ER 190's. labs wnl  Past Medical History:  Diagnosis Date  . Diabetes mellitus without complication ([place])   . Hyperlipidemia   . Hypertension   . Obesity     Past Surgical History:  Procedure Laterality Date  . ABDOMINAL HYSTERECTOMY      No family history on file. Social History:  reports that she has been smoking cigarettes. She has been smoking about 1.00 pack per day. She has never used smokeless tobacco. She reports current alcohol use. No history on file for drug.  Allergies:  Allergies  Allergen Reactions  . Sulfur     (Not in a hospital admission)   ROS:   Physical Examination: Blood pressure (!) 190/70, pulse (!) 56, temperature 99 F (37.2 C), temperature source Oral, resp. rate 17, height 5\' 2"  (1.575 m), weight 104.3 kg, SpO2 98 %.  HEENT-  Normocephalic, no lesions, without obvious abnormality.  Normal external eye and conjunctiva.  Normal TM's bilaterally.  Normal auditory canals and external ears. Normal external nose, mucus membranes and septum.  Normal  pharynx. Neck supple with no masses, nodes, nodules or enlargement. Cardiovascular - regular rate and rhythm, S1, S2 normal, no murmur, click, rub or gallop Lungs - chest clear, no wheezing, rales, normal symmetric air entry, Heart exam - S1, S2 normal, no murmur, no gallop, rate regular Abdomen - soft, non-tender; bowel sounds normal; no masses,  no organomegaly Extremities - no edema  Neurologic Examination: Alert, awake, oriented x4, speech nle . Follows commands CN: PERLA, EOMI, VFF seemed full, no nystagmus, nface symmetrical, face sensation nle, uvula/tongue midlie. No motor defcit appreciated No sensory deficit appreciated No coordination deficit appreciated DTR and gait not checked  No results found for this or any previous visit (from the past 48 hour(s)). Ct Head Wo Contrast  Result Date: 12/10/2018 CLINICAL DATA:  Headache. EXAM: CT HEAD WITHOUT CONTRAST TECHNIQUE: Contiguous axial images were obtained from the base of the skull through the vertex without intravenous contrast. COMPARISON:  None. FINDINGS: Brain: The brainstem and cerebellum are normal. There is acute infarction in the right parietal cortical and subcortical brain with mild swelling and petechial blood products. No frank hematoma. This is probably at least several days old. Elsewhere, there are old infarctions in the left basal ganglia in there are mild chronic small-vessel changes of the hemispheric white matter. No mass lesion, hemorrhage, hydrocephalus or extra-axial collection. Vascular: There is atherosclerotic calcification of the major vessels at the base of the brain. Skull: Negative Sinuses/Orbits: Clear/normal Other: None IMPRESSION: Acute/subacute infarction in the right parietal lobe, probably several days old. This is consistent with right  MCA branch vessel infarction. Petechial blood products without frank hematoma. No mass effect. 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 Chronic small-vessel  ischemic changes of the cerebral hemispheric white matter. Electronically Signed   By: Paulina FusiMark  Shogry M.D.   On: 12/10/2018 09:36    Assessment: 63 y.o. female with HTN, DM, hyperlipidemia presents to ER with HA/difficulties with vision. Neuro exam is none focal in whom head ct shows Acute/subacute infarction in the right parietal lobe, probablyseveral days old. This is consistent with right MCA branch vesselinfarction. Petechial blood products without frank hematoma.  Plan: Admit to telemetry Neuro protective measures including normothermia, normoglycemia, correct electrolyte/metabolic abnlities, treat any infection. freq neuro check. Keep BP less than 160 given the hgic converstion and need for some brain perfusion No ASA/Chemical prophylaxis for now, Use SCDs due to hgic conversion HgbA1c, fasting lipid panel MRI, MRA  of the brain without contrast rpeat head ct in am for stability PT consult, OT consult, Speech consult Echocardiogram Carotid dopplers Risk factor modification     12/10/2018, 10:20 AM

## 2018-12-10 NOTE — H&P (Signed)
[facility] NAME: Denise Santiago    MR#:  [id number]  DATE OF BIRTH:  [birth year]  DATE OF ADMISSION:  12/10/2018  PRIMARY CARE PHYSICIAN: Seward Carol, MD   REQUESTING/REFERRING PHYSICIAN: Dr. Marjean Donna  CHIEF COMPLAINT: Headache  No chief complaint on file.   HISTORY OF PRESENT ILLNESS:  Denise Santiago  is a 63 y.o. female with a known history of attention, diabetes mellitus type 2, hyperlipidemia, noncompliant with medication comes in because of headache, patient was seen in the emergency room 2 days ago for headache, supposed to have a CAT scan but she left AMA, comes today to have a head CT, CT head showed acute to subacute infarct in the right parietal lobe.  Patient denies any headache.  But she told me that 2 days ago when she had headache she had blurred vision.  No weakness in hands or legs.  No slurred speech.  No fever or shortness of breath that did not get exposed to anybody with COVID.  Patient lives alone.  Patient labs are pending at this time.  Because of fall acute to subacute infarct in the right parietal lobe we are asked to admit the patient.  Patient is seen by neurologist Dr. Kem Kays,, he recommends repeat head CT in 24 hours secondary to petechial hemorrhages seen on the CT head today.  Patient also needs full stroke work-up including MRI and MRA of the carotids, echocardiogram.  Patient not to get any aspirin, Lovenox for 24 hours until the repeat head CT is done.  Control the blood pressure as patient blood pressure is more than 409 systolic, patient told me that she does not take her BP medicines on a regular basis and did not see any PCP recently.  Blood pressure in the emergency room is more than 811 systolic.  PAST MEDICAL HISTORY:   Past Medical History:  Diagnosis Date  . Diabetes mellitus without complication ([place])   . Hyperlipidemia   . Hypertension   . Obesity     PAST SURGICAL HISTOIRY:    Past Surgical History:  Procedure Laterality Date  . ABDOMINAL HYSTERECTOMY      SOCIAL HISTORY:   Social History   Tobacco Use  . Smoking status: Current Every Day Smoker    Packs/day: 1.00    Types: Cigarettes  . Smokeless tobacco: Never Used  Substance Use Topics  . Alcohol use: Yes    Alcohol/week: 0.0 standard drinks    Comment: red wine per MD suggestion to lower cholesterol    FAMILY HISTORY:  No family history on file.  DRUG ALLERGIES:   Allergies  Allergen Reactions  . Sulfur     REVIEW OF SYSTEMS:  CONSTITUTIONAL: No fever, fatigue or weakness.  EYES: No blurred or double vision.  EARS, NOSE, AND THROAT: No tinnitus or ear pain.  RESPIRATORY: No cough, shortness of breath, wheezing or hemoptysis.  CARDIOVASCULAR: No chest pain, orthopnea, edema.  GASTROINTESTINAL: No nausea, vomiting, diarrhea or abdominal pain.  GENITOURINARY: No dysuria, hematuria.  ENDOCRINE: No polyuria, nocturia,  HEMATOLOGY: No anemia, easy bruising or bleeding SKIN: No rash or lesion. MUSCULOSKELETAL: No joint pain or arthritis.   NEUROLOGIC: No tingling, numbness, weakness.  PSYCHIATRY: No anxiety or depression.   MEDICATIONS AT HOME:   Prior to Admission medications   Medication Sig Start Date End Date Taking? Authorizing Provider  albuterol (PROVENTIL HFA;VENTOLIN HFA) 108 (90 Base) MCG/ACT inhaler Inhale 2 puffs into the lungs every  4 (four) hours as needed for wheezing or shortness of breath. 02/02/16  Yes Hayden RasmussenMabe, David, NP  aspirin 81 MG tablet Take 81 mg by mouth daily.   Yes [provider]  atorvastatin (LIPITOR) 10 MG tablet Take 10 mg by mouth daily.   Yes [provider]  citalopram (CELEXA) 20 MG tablet Take 10 mg by mouth daily as needed.    Yes [provider]  latanoprost (XALATAN) 0.005 % ophthalmic solution Place 1 drop into both eyes at bedtime.   Yes [provider]  lisinopril-hydrochlorothiazide (PRINZIDE,ZESTORETIC)  20-12.5 MG per tablet Take 1 tablet by mouth daily.   Yes [provider]  metFORMIN (GLUCOPHAGE) 850 MG tablet Take 850 mg by mouth 2 (two) times daily with a meal.   Yes [provider]  torsemide (DEMADEX) 20 MG tablet Take 10 mg by mouth daily as needed.    Yes [provider]      VITAL SIGNS:  Blood pressure (!) 190/70, pulse (!) 56, temperature 99 F (37.2 C), temperature source Oral, resp. rate 17, height 5\' 2"  (1.575 m), weight 104.3 kg, SpO2 98 %.  PHYSICAL EXAMINATION:  GENERAL:  63 y.o.-year-old patient lying in the bed with no acute distress.  EYES: Pupils equal, round, reactive to light and accommodation. No scleral icterus. Extraocular muscles intact.  HEENT: Head atraumatic, normocephalic. Oropharynx and nasopharynx clear.  NECK:  Supple, no jugular venous distention. No thyroid enlargement, no tenderness.  LUNGS: Normal breath sounds bilaterally, no wheezing, rales,rhonchi or crepitation. No use of accessory muscles of respiration.  CARDIOVASCULAR: S1, S2 normal. No murmurs, rubs, or gallops.  ABDOMEN: Soft, nontender, nondistended. Bowel sounds present. No organomegaly or mass.  EXTREMITIES: No pedal edema, cyanosis, or clubbing.  NEUROLOGIC: Cranial nerves II through XII are intact. Muscle strength 5/5 in all extremities. Sensation intact. Gait not checked.  PSYCHIATRIC: The patient is alert and oriented x 3.  SKIN: No obvious rash, lesion, or ulcer.   LABORATORY PANEL:   CBC Recent Labs  Lab 12/10/18 0958  WBC 5.1  HGB 15.7*  HCT 46.1*  PLT 126*   ------------------------------------------------------------------------------------------------------------------  Chemistries  Recent Labs  Lab 12/10/18 0958  NA 138  K 3.5  CL 96*  CO2 30  GLUCOSE 126*  BUN 14  CREATININE 1.16*  CALCIUM 9.5  AST 20  ALT 13  ALKPHOS 78  BILITOT 0.7    ------------------------------------------------------------------------------------------------------------------  Cardiac Enzymes No results for input(s): TROPONINI in the last 168 hours. ------------------------------------------------------------------------------------------------------------------  RADIOLOGY:  Ct Head Wo Contrast  Result Date: 12/10/2018 CLINICAL DATA:  Headache. EXAM: CT HEAD WITHOUT CONTRAST TECHNIQUE: Contiguous axial images were obtained from the base of the skull through the vertex without intravenous contrast. COMPARISON:  None. FINDINGS: Brain: The brainstem and cerebellum are normal. There is acute infarction in the right parietal cortical and subcortical brain with mild swelling and petechial blood products. No frank hematoma. This is probably at least several days old. Elsewhere, there are old infarctions in the left basal ganglia in there are mild chronic small-vessel changes of the hemispheric white matter. No mass lesion, hemorrhage, hydrocephalus or extra-axial collection. Vascular: There is atherosclerotic calcification of the major vessels at the base of the brain. Skull: Negative Sinuses/Orbits: Clear/normal Other: None IMPRESSION: Acute/subacute infarction in the right parietal lobe, probably several days old. This is consistent with right MCA branch vessel infarction. Petechial blood products without frank hematoma. No mass effect. 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 Chronic small-vessel ischemic  changes of the cerebral hemispheric white matter. Electronically Signed   By: Paulina FusiMark  Shogry M.D.   On: 12/10/2018 09:36    EKG:   Orders placed or performed during the hospital encounter of 12/10/18  . ED EKG  . ED EKG  . EKG 12-Lead  . EKG 12-Lead    IMPRESSION AND PLAN:  63 year old female with history of hypertension, type 2 diabetes mellitus, noncompliance with medication comes in because of headache, blurred vision found to have acute  to subacute stroke in the right parietal region.  #1 .acute right parietal stroke, patient also has some particular blood products, admit to stroke unit, repeat head CT in 24 hours, hold anticoagulation including aspirin, Lovenox till then, spoke with patient about that, continue full stroke work-up including MRI of carotids, echocardiogram, strict BP control, 2.  Risk factors of diabetes mellitus type 2, essential hypertension, hyperlipidemia with noncompliance to medications. 3.  Diabetes mellitus type 2: Noncompliance, check hemoglobin A1c, ordered sliding scale insulin with coverage, when she follows swallow screen and start metformin.  Patient on metformin 500 mg p.o. twice daily but did not take it for a long time. 4.  Hyperlipidemia patient LDL cholesterol is more than 113 but it is not fasting, check fasting lipids tomorrow, start high intensity statins for now. 5.  Uncontrolled hypertension, keep the blood pressure less than 160 given acute stroke, ordered home blood pressure medications.    All the records are reviewed and case discussed with ED provider. Management plans discussed with the patient, family and they are in agreement.  CODE STATUS: Full code  TOTAL TIME TAKING CARE OF THIS PATIENT: 55 minutes.    Katha HammingSnehalatha Kelan Pritt M.D on 12/10/2018 at 10:48 AM  Between 7am to 6pm - Pager - [phone]  After 6pm go to [email or web address] - password [email or web address]  Fabio Neighbors[facility]  Office  [phone]  CC: Primary care physician; Renford DillsPolite, Ronald, MD  Note: This dictation was prepared with Dragon dictation along with smaller phrase technology. Any transcriptional errors that result from this process are unintentional.

## 2018-12-10 NOTE — ED Provider Notes (Addendum)
[facility] Emergency Department Provider Note  ____________________________________________   First MD Initiated Contact with Patient 12/10/18 [phone]     (approximate)  I have reviewed the triage vital signs and the nursing notes.  HISTORY  Chief Complaint No chief complaint on file.    HPI Denise Santiago is a 63 y.o. female history of diabetes, hypertension, hyperlipidemia who presents to the emergency department for a (now resolved) headache.  Patient states on Thursday and Friday she had a severe headache, which has since gone away.  She also reports some associated blurry vision in both eyes.  She states she visited her ophthalmologist yesterday for this, who started her on eyedrops, unsure if this is helping.  Today she presents at the recommendation of her PCP for a CT scan of the head.  On her presentation she has no acute complaints.         Past Medical Hx Past Medical History:  Diagnosis Date  . Diabetes mellitus without complication ([place])   . Hyperlipidemia   . Hypertension   . Obesity     Problem List There are no active problems to display for this patient.   Past Surgical Hx Past Surgical History:  Procedure Laterality Date  . ABDOMINAL HYSTERECTOMY      Medications Prior to Admission medications   Medication Sig Start Date End Date Taking? Authorizing Provider  albuterol (PROVENTIL HFA;VENTOLIN HFA) 108 (90 Base) MCG/ACT inhaler Inhale 2 puffs into the lungs every 4 (four) hours as needed for wheezing or shortness of breath. 02/02/16   Janne Napoleon, NP  aspirin 81 MG tablet Take 81 mg by mouth daily.    [provider]  atorvastatin (LIPITOR) 10 MG tablet Take 10 mg by mouth daily.    [provider]  citalopram (CELEXA) 20 MG tablet Take 20 mg by mouth daily.    [provider]  latanoprost (XALATAN) 0.005 % ophthalmic solution Place 1 drop into both eyes at bedtime.    [provider]   lisinopril-hydrochlorothiazide (PRINZIDE,ZESTORETIC) 20-12.5 MG per tablet Take 1 tablet by mouth daily.    [provider]  metFORMIN (GLUCOPHAGE) 850 MG tablet Take 850 mg by mouth 2 (two) times daily with a meal.    [provider]  torsemide (DEMADEX) 20 MG tablet Take 20 mg by mouth daily as needed.    [provider]  vitamin B-12 (CYANOCOBALAMIN) 50 MCG tablet Take 50 mcg by mouth daily.    [provider]    Allergies Sulfur  Family Hx No family history on file.  Social Hx Social History   Tobacco Use  . Smoking status: Current Every Day Smoker    Packs/day: 1.00    Types: Cigarettes  . Smokeless tobacco: Never Used  Substance Use Topics  . Alcohol use: Yes    Alcohol/week: 0.0 standard drinks    Comment: red wine per MD suggestion to lower cholesterol  . Drug use: Not on file    Review of Systems  Constitutional: Negative for fever. Negative for chills. Eyes: Positive for visual changes. ENT: Negative for sore throat. Cardiovascular: Negative for chest pain. Respiratory: Negative for shortness of breath. Gastrointestinal: Negative for abdominal pain. Negative for nausea. Negative for vomiting. Genitourinary: Negative for dysuria. Musculoskeletal: Negative for leg swelling. Skin: Negative for rash. Neurological: Positive for for headaches.  ____________________________________________   PHYSICAL EXAM:  Vital Signs: ED Triage Vitals  Enc Vitals Group     BP --  Pulse Rate 12/10/18 0842 78     Resp 12/10/18 0842 18     Temp 12/10/18 0842 99 F (37.2 C)     Temp Source 12/10/18 0842 Oral     SpO2 12/10/18 0842 98 %     Weight --      Height --      Head Circumference --      Peak Flow --      Pain Score 12/10/18 0844 0     Pain Loc --      Pain Edu? --      Excl. in GC? --     Constitutional: Alert and oriented.  Eyes: Conjunctivae are normal. Sclera anicteric.  Vision grossly intact. Visual fields in  tact. Head: Normocephalic. Atraumatic. Nose: No congestion. No rhinorrhea. Mouth/Throat: Mucous membranes are moist.  Neck: No stridor.   Cardiovascular: Normal rate, regular rhythm. Grossly normal heart sounds. Extremities well perfused. Respiratory: Normal respiratory effort.  Lungs CTAB. Gastrointestinal: Soft and non-tender. No distention.  Musculoskeletal: No lower extremity edema. Neurologic:  Normal speech and language. No gross focal neurologic deficits are appreciated. Alert and oriented.  Face symmetric.  Tongue midline.  Cranial nerves II through XII intact. UE and LE strength 5/5 and symmetric. UE and LE SILT.  Skin: Skin is warm, dry and intact. No rash noted. Psychiatric: Mood and affect are appropriate for situation.    ____________________________________________  RADIOLOGY  CT head: Subacute infarct of the right parietal lobe, probably several days old.   EKG Rate: 59 Rhythm: sinus Axis: normal Intervals: within normal limits No acute ischemic changes  ____________________________________________   PROCEDURES  Procedure(s) performed (including Critical Care):  Procedures   ____________________________________________   INITIAL IMPRESSION / ASSESSMENT AND PLAN / ED COURSE  63 y.o. female with history of hypertension, hyperlipidemia, diabetes who presents to the ED for now resolved severe headache on Thursday and Friday, with some lingering blurry vision.  Sent from PCP with concern for possible stroke.  On exam here, her vision is grossly intact, no focal neurological deficits.  Plan: CT head. Given time frame, she does not qualify for tPA, therefore no code stroke initiated.  CT head is notable for an acute/subacute infarct of the right parietal lobe, probably several days old, consistent with her symptomatology.  Labs ordered, discussed with neurology who will evaluate.  Discussed with hospitalist for admission.   ____________________________________________   FINAL CLINICAL IMPRESSION(S) / ED DIAGNOSES  Final diagnoses:  Acute ischemic right MCA stroke [facility](HCC)       Note:  This document was prepared using Dragon voice recognition software and may include unintentional dictation errors.     Miguel AschoffMonks, Marena Witts L., MD 12/10/18 Mila Merry1949    Miguel AschoffMonks, Corney Knighton L., MD 12/25/18 [phone]

## 2018-12-10 NOTE — Progress Notes (Signed)
SLP Cancellation Note  Patient Details Name: Denise Santiago MRN: [id number] DOB: 03-18-1956   Cancelled treatment:       Reason Eval/Treat Not Completed: SLP screened, no needs identified, will sign off(chart reviewed; consulted NSG then met w/ pt in room) Pt denied any difficulty swallowing and is currently on a regular diet; tolerates swallowing pills w/ water per NSG. Pt conversed at conversational level w/out deficits noted; pt and NSG/PT denied any speech-language deficits.  No further skilled ST services indicated as pt appears at her baseline. Pt agreed. NSG to reconsult if any change in status.     [name], MS, CCC-SLP [name] 12/10/2018, 3:48 PM

## 2018-12-10 NOTE — ED Notes (Signed)
Pt to CT with Tech

## 2018-12-10 NOTE — ED Notes (Signed)
ED TO INPATIENT HANDOFF REPORT  ED Nurse Name and Phone #: bill [phone]   S Name/Age/Gender Denise SidlesSabryna Santiago 63 y.o. female Room/Bed: ED33A/ED33A  Code Status   Code Status: Full Code  Home/SNF/Other Home Patient oriented to: self, place, time and situation Is this baseline? Yes   Triage Complete: Triage complete  Chief Complaint headache  Triage Note Pt was in ED last week for headache, pt left AMA from lobby. PT was told by her PCP she needs to come back to have head CT. PT denies any headache at this time. PT ambulatory, NAD noted    Allergies Allergies  Allergen Reactions  . Sulfur     Level of Care/Admitting Diagnosis ED Disposition    ED Disposition Condition Comment   Admit  Hospital Area: [facility] [[id number]]  Level of Care: Med-Surg [16]  Covid Evaluation: Asymptomatic Screening Protocol (No Symptoms)  Diagnosis: Acute ischemic right MCA stroke [facility](HCC) [[id number]][[id number]]  Admitting Physician: Katha HammingKONIDENA, SNEHALATHA [[id number]]  Attending Physician: Katha HammingKONIDENA, SNEHALATHA [phone][[id number]]  PT Class (Do Not Modify): Observation [104]  PT Acc Code (Do Not Modify): Observation [10022]       B Medical/Surgery History Past Medical History:  Diagnosis Date  . Diabetes mellitus without complication (HCC)   . Hyperlipidemia   . Hypertension   . Obesity    Past Surgical History:  Procedure Laterality Date  . ABDOMINAL HYSTERECTOMY       A IV Location/Drains/Wounds Patient Lines/Drains/Airways Status   Active Line/Drains/Airways    Name:   Placement date:   Placement time:   Site:   Days:   Peripheral IV 12/10/18 Left Antecubital   12/10/18    1005    Antecubital   less than 1          Intake/Output Last 24 hours No intake or output data in the 24 hours ending 12/10/18 1226  Labs/Imaging Results for orders placed or performed during the hospital encounter of 12/10/18 (from the past 48 hour(s))  Comprehensive metabolic panel     Status: Abnormal    Collection Time: 12/10/18  9:58 AM  Result Value Ref Range   Sodium 138 135 - 145 mmol/L   Potassium 3.5 3.5 - 5.1 mmol/L   Chloride 96 (L) 98 - 111 mmol/L   CO2 30 22 - 32 mmol/L   Glucose, Bld 126 (H) 70 - 99 mg/dL   BUN 14 8 - 23 mg/dL   Creatinine, Ser 5.621.16 (H) 0.44 - 1.00 mg/dL   Calcium 9.5 8.9 - 13.010.3 mg/dL   Total Protein 8.1 6.5 - 8.1 g/dL   Albumin 4.0 3.5 - 5.0 g/dL   AST 20 15 - 41 U/L   ALT 13 0 - 44 U/L   Alkaline Phosphatase 78 38 - 126 U/L   Total Bilirubin 0.7 0.3 - 1.2 mg/dL   GFR calc non Af Amer 50 (L) >60 mL/min   GFR calc Af Amer 58 (L) >60 mL/min   Anion gap 12 5 - 15    Comment: Performed at [facility], [name] [place], [place] [address]  CBC with Differential     Status: Abnormal   Collection Time: 12/10/18  9:58 AM  Result Value Ref Range   WBC 5.1 4.0 - 10.5 K/uL   RBC 4.97 3.87 - 5.11 MIL/uL   Hemoglobin 15.7 (H) 12.0 - 15.0 g/dL   HCT 46.946.1 (H) 62.936.0 - 52.846.0 %   MCV 92.8 80.0 - 100.0 fL   MCH 31.6 26.0 -  34.0 pg   MCHC 34.1 30.0 - 36.0 g/dL   RDW 16.112.2 09.611.5 - 04.515.5 %   Platelets 126 (L) 150 - 400 K/uL   nRBC 0.0 0.0 - 0.2 %   Neutrophils Relative % 61 %   Neutro Abs 3.0 1.7 - 7.7 K/uL   Lymphocytes Relative 26 %   Lymphs Abs 1.3 0.7 - 4.0 K/uL   Monocytes Relative 12 %   Monocytes Absolute 0.6 0.1 - 1.0 K/uL   Eosinophils Relative 0 %   Eosinophils Absolute 0.0 0.0 - 0.5 K/uL   Basophils Relative 1 %   Basophils Absolute 0.1 0.0 - 0.1 K/uL   Immature Granulocytes 0 %   Abs Immature Granulocytes 0.01 0.00 - 0.07 K/uL    Comment: Performed at [facility], [address] [place], [place] [address]  Protime-INR     Status: None   Collection Time: 12/10/18  9:58 AM  Result Value Ref Range   Prothrombin Time 13.7 11.4 - 15.2 seconds   INR 1.1 0.8 - 1.2    Comment: (NOTE) INR goal varies based on device and disease states. Performed at [facility], [address] [place], [place] [address]   APTT      Status: None   Collection Time: 12/10/18  9:58 AM  Result Value Ref Range   aPTT 35 24 - 36 seconds    Comment: Performed at [facility], [address] [place], [place] [address]  Hemoglobin A1c     Status: Abnormal   Collection Time: 12/10/18  9:58 AM  Result Value Ref Range   Hgb A1c MFr Bld 5.9 (H) 4.8 - 5.6 %    Comment: (NOTE) Pre diabetes:          5.7%-6.4% Diabetes:              >6.4% Glycemic control for   <7.0% adults with diabetes    Mean Plasma Glucose 122.63 mg/dL    Comment: Performed at [facility], [address] [place], [place] [address]  Lipid panel     Status: Abnormal   Collection Time: 12/10/18  9:58 AM  Result Value Ref Range   Cholesterol 162 0 - 200 mg/dL   Triglycerides 51 <657<150 mg/dL   HDL 39 (L) >84>40 mg/dL   Total CHOL/HDL Ratio 4.2 RATIO   VLDL 10 0 - 40 mg/dL   LDL Cholesterol 696113 (H) 0 - 99 mg/dL    Comment:        Total Cholesterol/HDL:CHD Risk Coronary Heart Disease Risk Table                     Men   Women  1/2 Average Risk   3.4   3.3  Average Risk       5.0   4.4  2 X Average Risk   9.6   7.1  3 X Average Risk  23.4   11.0        Use the calculated Patient Ratio above and the CHD Risk Table to determine the patient's CHD Risk.        ATP III CLASSIFICATION (LDL):  <100     mg/dL   Optimal  295-284100-129  mg/dL   Near or Above                    Optimal  130-159  mg/dL   Borderline  132-440160-189  mg/dL   High  >102>190  mg/dL   Very High Performed at [facility], [address] [place], [place] [address]    Ct Head Wo Contrast  Result Date: 12/10/2018 CLINICAL DATA:  Headache. EXAM: CT HEAD WITHOUT CONTRAST TECHNIQUE: Contiguous axial images were obtained from the base of the skull through the vertex without intravenous contrast. COMPARISON:  None. FINDINGS: Brain: The brainstem and cerebellum are normal. There is acute infarction in the right parietal cortical and subcortical brain with mild swelling and  petechial blood products. No frank hematoma. This is probably at least several days old. Elsewhere, there are old infarctions in the left basal ganglia in there are mild chronic small-vessel changes of the hemispheric white matter. No mass lesion, hemorrhage, hydrocephalus or extra-axial collection. Vascular: There is atherosclerotic calcification of the major vessels at the base of the brain. Skull: Negative Sinuses/Orbits: Clear/normal Other: None IMPRESSION: Acute/subacute infarction in the right parietal lobe, probably several days old. This is consistent with right MCA branch vessel infarction. Petechial blood products without frank hematoma. No mass effect. 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 Chronic small-vessel ischemic changes of the cerebral hemispheric white matter. Electronically Signed   By: Nelson Chimes M.D.   On: 12/10/2018 09:36    Pending Labs Unresulted Labs (From admission, onward)    Start     Ordered   12/11/18 0500  Hemoglobin A1c  Tomorrow morning,   STAT     12/10/18 1015   12/11/18 0500  Lipid panel  Tomorrow morning,   STAT    Comments: Fasting    12/10/18 1015   12/10/18 1017  Hemoglobin A1c  Once,   STAT    Comments: To assess prior glycemic control    12/10/18 1016   12/10/18 1013  HIV antibody (Routine Testing)  Once,   STAT     12/10/18 1015   12/10/18 1004  SARS CORONAVIRUS 2 Nasal Swab Aptima Multi Swab  (Asymptomatic/Tier 2 Patients Labs)  Once,   STAT    Question Answer Comment  Is this test for diagnosis or screening Screening   Symptomatic for COVID-19 as defined by CDC Unknown   Hospitalized for COVID-19 Unknown   Admitted to ICU for COVID-19 No   Previously tested for COVID-19 No   Resident in a congregate (group) care setting No   Employed in healthcare setting No   Pregnant No      12/10/18 1003          Vitals/Pain Today's Vitals   12/10/18 0939 12/10/18 0957 12/10/18 1100 12/10/18 1121  BP:  (!) 190/70 (!) 177/91    Pulse:  (!) 56 65 (!) 57  Resp:  17 19 18   Temp:    98.3 F (36.8 C)  TempSrc:    Oral  SpO2:  98% 95% 98%  Weight:  104.3 kg    Height:  5\' 2"  (1.575 m)    PainSc: 0-No pain 0-No pain  0-No pain    Isolation Precautions No active isolations  Medications Medications   stroke: mapping our early stages of recovery book (has no administration in time range)  acetaminophen (TYLENOL) tablet 650 mg (has no administration in time range)    Or  acetaminophen (TYLENOL) solution 650 mg (has no administration in time range)    Or  acetaminophen (TYLENOL) suppository 650 mg (has no administration in time range)  insulin aspart (novoLOG) injection 0-9 Units (has no administration in time range)  insulin aspart (novoLOG) injection 0-5 Units (has no administration  in time range)  albuterol (VENTOLIN HFA) 108 (90 Base) MCG/ACT inhaler 2 puff (has no administration in time range)  atorvastatin (LIPITOR) tablet 10 mg (has no administration in time range)  latanoprost (XALATAN) 0.005 % ophthalmic solution 1 drop (has no administration in time range)  lisinopril (ZESTRIL) tablet 20 mg (has no administration in time range)    And  hydrochlorothiazide (MICROZIDE) capsule 12.5 mg (has no administration in time range)    Mobility walks with person assist Low fall risk   Focused Assessments Neuro Assessment Handoff:  Swallow screen pass? Yes    NIH Stroke Scale ( + Modified Stroke Scale Criteria)  Interval: Shift assessment Level of Consciousness (1a.)   : Alert, keenly responsive LOC Questions (1b. )   +: Answers both questions correctly LOC Commands (1c. )   + : Performs both tasks correctly Best Gaze (2. )  +: Normal Visual (3. )  +: No visual loss Facial Palsy (4. )    : Normal symmetrical movements Motor Arm, Left (5a. )   +: No drift Motor Arm, Right (5b. )   +: No drift Motor Leg, Left (6a. )   +: No drift Motor Leg, Right (6b. )   +: No drift Limb Ataxia (7. ): Absent Sensory  (8. )   +: Normal, no sensory loss Best Language (9. )   +: No aphasia Dysarthria (10. ): Normal Extinction/Inattention (11.)   +: No Abnormality Modified SS Total  +: 0 Complete NIHSS TOTAL: 0     Neuro Assessment: Within Defined Limits Neuro Checks:   Shift assessment (12/10/18 1118)  Last Documented NIHSS Modified Score: 0 (12/10/18 1118) Has TPA been given? No If patient is a Neuro Trauma and patient is going to OR before floor call report to 4N Charge nurse: [phone] or [phone]     R Recommendations: See Admitting Provider Note  Report given to:   Additional Notes:

## 2018-12-11 ENCOUNTER — Observation Stay: Payer: Self-pay

## 2018-12-11 LAB — LIPID PANEL
Cholesterol: 137 mg/dL (ref 0–200)
HDL: 33 mg/dL — ABNORMAL LOW (ref >40)
LDL Cholesterol: 94 mg/dL (ref 0–99)
Total CHOL/HDL Ratio: 4.2 RATIO
Triglycerides: 52 mg/dL (ref <150)
VLDL: 10 mg/dL (ref 0–40)

## 2018-12-11 LAB — GLUCOSE, CAPILLARY
Glucose-Capillary: 141 mg/dL — ABNORMAL HIGH (ref 70–99)
Glucose-Capillary: 159 mg/dL — ABNORMAL HIGH (ref 70–99)

## 2018-12-11 LAB — ECHOCARDIOGRAM COMPLETE
Height: 62 in
Weight: 3680 oz

## 2018-12-11 LAB — HEMOGLOBIN A1C
Hgb A1c MFr Bld: 5.8 % — ABNORMAL HIGH (ref 4.8–5.6)
Mean Plasma Glucose: 119.76 mg/dL

## 2018-12-11 LAB — HIV ANTIBODY (ROUTINE TESTING W REFLEX): HIV Screen 4th Generation wRfx: NONREACTIVE

## 2018-12-11 MED ORDER — ASPIRIN EC 81 MG PO TBEC
81.0000 mg | DELAYED_RELEASE_TABLET | Freq: Every day | ORAL | Status: DC
  Administered 2018-12-11: 81 mg via ORAL
  Filled 2018-12-11: qty 1

## 2018-12-11 MED ORDER — ATORVASTATIN CALCIUM 40 MG PO TABS: 40.0000 mg | ORAL_TABLET | Freq: Every day | ORAL | 0 refills | Status: DC

## 2018-12-11 MED ORDER — CITALOPRAM HYDROBROMIDE 10 MG PO TABS: 10.0000 mg | ORAL_TABLET | Freq: Every day | ORAL | 0 refills | Status: AC

## 2018-12-11 MED ORDER — METFORMIN HCL 850 MG PO TABS: 850.0000 mg | ORAL_TABLET | Freq: Two times a day (BID) | ORAL | 0 refills | Status: DC

## 2018-12-11 MED ORDER — ASPIRIN 81 MG PO TABS: 81.0000 mg | ORAL_TABLET | Freq: Every day | ORAL | 0 refills | Status: DC

## 2018-12-11 MED ORDER — ENOXAPARIN SODIUM 40 MG/0.4ML ~~LOC~~ SOLN: 40.0000 mg | Freq: Two times a day (BID) | SUBCUTANEOUS | Status: DC

## 2018-12-11 MED ORDER — ALBUTEROL SULFATE HFA 108 (90 BASE) MCG/ACT IN AERS: 2.0000 | INHALATION_SPRAY | RESPIRATORY_TRACT | 0 refills | Status: DC | PRN

## 2018-12-11 MED ORDER — LATANOPROST 0.005 % OP SOLN: 1.0000 [drp] | Freq: Every day | OPHTHALMIC | 12 refills | Status: AC

## 2018-12-11 MED ORDER — LISINOPRIL-HYDROCHLOROTHIAZIDE 20-12.5 MG PO TABS: 1.0000 | ORAL_TABLET | Freq: Every day | ORAL | 0 refills | Status: DC

## 2018-12-11 MED ORDER — ATORVASTATIN CALCIUM 20 MG PO TABS: 40.0000 mg | ORAL_TABLET | Freq: Every day | ORAL | Status: DC

## 2018-12-11 NOTE — Progress Notes (Signed)
Discharge home, [facility], cardiolipin neurology.  Discharged home with aspirin 81 mg daily, high intensity statins.  Discussed with the neurologist, recommended low-dose aspirin for now.  Medications for diabetes, hypertension, hyperlipidemia are printed.  Discharge home today.  Continue low-dose aspirin, high intensity statins.

## 2018-12-11 NOTE — Plan of Care (Signed)
  Problem: Education: Goal: Knowledge of disease or condition will improve Outcome: Adequate for Discharge Goal: Knowledge of secondary prevention will improve Outcome: Adequate for Discharge Goal: Knowledge of patient specific risk factors addressed and post discharge goals established will improve Outcome: Adequate for Discharge Goal: Individualized Educational Video(s) Outcome: Adequate for Discharge   Problem: Self-Care: Goal: Verbalization of feelings and concerns over difficulty with self-care will improve Outcome: Adequate for Discharge Goal: Ability to communicate needs accurately will improve Outcome: Adequate for Discharge   Problem: Nutrition: Goal: Dietary intake will improve Outcome: Adequate for Discharge   Problem: Education: Goal: Knowledge of General Education information will improve Description: Including pain rating scale, medication(s)/side effects and non-pharmacologic comfort measures Outcome: Adequate for Discharge   Problem: Health Behavior/Discharge Planning: Goal: Ability to manage health-related needs will improve Outcome: Adequate for Discharge   Problem: Clinical Measurements: Goal: Ability to maintain clinical measurements within normal limits will improve Outcome: Adequate for Discharge Goal: Will remain free from infection Outcome: Adequate for Discharge Goal: Diagnostic test results will improve Outcome: Adequate for Discharge Goal: Respiratory complications will improve Outcome: Adequate for Discharge Goal: Cardiovascular complication will be avoided Outcome: Adequate for Discharge   Problem: Activity: Goal: Risk for activity intolerance will decrease Outcome: Adequate for Discharge   Problem: Nutrition: Goal: Adequate nutrition will be maintained Outcome: Adequate for Discharge   Problem: Coping: Goal: Level of anxiety will decrease Outcome: Adequate for Discharge   Problem: Elimination: Goal: Will not experience complications  related to bowel motility Outcome: Adequate for Discharge Goal: Will not experience complications related to urinary retention Outcome: Adequate for Discharge   Problem: Pain Managment: Goal: General experience of comfort will improve Outcome: Adequate for Discharge   Problem: Safety: Goal: Ability to remain free from injury will improve Outcome: Adequate for Discharge   Problem: Skin Integrity: Goal: Risk for impaired skin integrity will decrease Outcome: Adequate for Discharge

## 2018-12-11 NOTE — TOC Transition Note (Signed)
Transition of Care [facility]) - CM/SW Discharge Note   Patient Details  Name: Denise Santiago MRN: [id number] Date of Birth: 1955-10-15  Transition of [facility]) CM/SW Contact:  Meloni Hinz, Lenice Llamas Phone Number: [phone]  12/11/2018, 3:10 PM   Clinical Narrative: Clinical Social Worker (CSW) met with patient alone at bedside to discuss D/C plan and offer resources. Patient was alert and oriented X4 and was sitting up on the side of bed. Per patient she lives in [place], [place] in [place]. 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 Patient reported that she lost her health insurance and currently does not have insurance. A financial counselor met with patient at bedside as well. CSW provided patient with [facility] information. CSW explained that the [facility] can provide patient with a PCP without health insurance. CSW explained that patient will have to get services in [facility] because she [place] in [place]. CSW also provided patient with [facility] information. CSW faxed patient's prescriptions to medication management who will assist her with medications 1 time. Per Aldona Bar at medication management they have all of patient's medication and will have it ready for pick up today. CSW made patient aware that she has to be at the medication management clinic by 4:15 today. CSW provided patient with the medicaid management's address and phone number. Per patient she will have her ride at [facility] in time to go to medication management. RN aware of above. Please reconsult if future social work needs arise. CSW signing off.       Final next level of care: Home/Self Care Barriers to Discharge: Barriers Resolved   Patient Goals and CMS Choice Patient states their goals for this  hospitalization and ongoing recovery are:: To feel better      Discharge Placement                       Discharge Plan and Services                                     Social Determinants of Health (SDOH) Interventions     Readmission Risk Interventions No flowsheet data found.

## 2018-12-11 NOTE — Discharge Summary (Signed)
Denise Santiago, is a 63 y.o. female  DOB 11-May-1955  MRN [id number].  Admission date:  12/10/2018  Admitting Physician  Katha HammingSnehalatha Komal Stangelo, MD  Discharge Date:  12/11/2018   Primary MD  Renford DillsPolite, Ronald, MD  Recommendations for primary care physician for things to follow:   [facility], medication management clinic as scheduled Follow-up with [facility] as a new patient for follow-up of her stroke.   Admission Diagnosis  Acute ischemic right MCA stroke [facility](HCC) [I63.511]   Discharge Diagnosis  Acute ischemic right MCA stroke (HCC) [I63.511]    Active Problems:   Acute ischemic right MCA stroke [facility](HCC)      Past Medical History:  Diagnosis Date  . Diabetes mellitus without complication (HCC)   . Hyperlipidemia   . Hypertension   . Obesity     Past Surgical History:  Procedure Laterality Date  . ABDOMINAL HYSTERECTOMY         History of present illness and  Hospital Course:     Kindly see H&P for history of present illness and admission details, please review complete Labs, Consult reports and Test reports for all details in brief  HPI  from the history and physical done on the day of admission 63 year old female with history of essential hypertension, diabetes mellitus type 2, hyperlipidemia, medication noncompliance comes in because of need for head CAT scan as patient had a headache, blurred vision 2 days ago but did not want to wait in the emergency room and she signed out AMA, comes in now yesterday for head CAT scan which showed acute stroke so because of that she is admitted for evaluation of acute stroke.   Hospital Course  #1. acute right MCA stroke, patient had headache, blurred vision before but no symptoms yesterday, patient did have a petechial hemorrhages on the CT  head, neurology recommended admission to stroke unit, patient is admitted, full stroke work-up was done including MRI brain, MRA of carotids.  MRI of the brain showed acute right MCA infarct, patient also had a second 7 mm acute infarct in the right posterior frontal r region.  Patient repeat CT of the head did not show worsening hemorrhages.  She is seen by physical therapy, outpatient therapy, speech therapy did not require any of these are easiest at discharge, echocardiogram showed EF more than 50%, MRA of carotids did not show any hemodynamically significant stenosis, LDL is 94,*hemoglobin A1c is around 6.  Patient noncompliant with medications for hypertension, diabetes mellitus type 2 due to lack of insurance. Discharged her home with aspirin 81 mg daily, high intensity statins, [facility] regarding follow-up of stroke.  Patient felt fine and wanted to go home today. 2.  Diabetes mellitus type 2, noncompliance with medicine secondary to lack of insurance, patient supposed to be on metformin 500 mg p.o. twice daily, social worker contacted for her medication help, but since are printed. 3.  Hyperlipidemia, LDL 94, discharged home with atorvastatin 40 mg p.o. daily #4 .essential hypertension, uncontrolled blood pressure in the emergency room, BP better controlled now, patient can continue lisinopril/HCTZ, at discharge.     Discharge Condition: Stable   Follow UP  Follow-up Information    Polite, Windy Fastonald, MD. Schedule an appointment as soon as possible for a visit in 1 week(s).   Specialty: Internal Medicine Contact information: [address] [place] [address] [phone]        [facility]. Schedule an appointment  as soon as possible for a visit in 1 week(s).        Schedule an appointment as soon as possible for a visit with Anabel Bene, MD.   Specialty: Neurology Why: Follow-up with Dr. Starleen Blue as a new patient for follow-up  of stroke. Contact information: [address] [place] [address] [phone]             Discharge Instructions  and  Discharge Medications      Allergies as of 12/11/2018      Reactions   Sulfur       Medication List    STOP taking these medications   torsemide 20 MG tablet Commonly known as: DEMADEX     TAKE these medications   albuterol 108 (90 Base) MCG/ACT inhaler Commonly known as: VENTOLIN HFA Inhale 2 puffs into the lungs every 4 (four) hours as needed for wheezing or shortness of breath.   aspirin 81 MG tablet Take 1 tablet (81 mg total) by mouth daily.   atorvastatin 40 MG tablet Commonly known as: LIPITOR Take 1 tablet (40 mg total) by mouth daily. Start taking on: December 12, 2018 What changed:   medication strength  how much to take   citalopram 10 MG tablet Commonly known as: CELEXA Take 1 tablet (10 mg total) by mouth daily. Start taking on: December 12, 2018 What changed:   medication strength  when to take this  reasons to take this   latanoprost 0.005 % ophthalmic solution Commonly known as: XALATAN Place 1 drop into both eyes at bedtime.   lisinopril-hydrochlorothiazide 20-12.5 MG tablet Commonly known as: ZESTORETIC Take 1 tablet by mouth daily.   metFORMIN 850 MG tablet Commonly known as: GLUCOPHAGE Take 1 tablet (850 mg total) by mouth 2 (two) times daily with a meal.         Diet and Activity recommendation: See Discharge Instructions above   Consults obtained -neurology   Major procedures and Radiology Reports - PLEASE review detailed and final reports for all details, in brief -      Ct Head Wo Contrast  Result Date: 12/11/2018 CLINICAL DATA:  Stroke follow-up EXAM: CT HEAD WITHOUT CONTRAST TECHNIQUE: Contiguous axial images were obtained from the base of the skull through the vertex without intravenous contrast. COMPARISON:  Brain MRI from yesterday FINDINGS: Brain: Cytotoxic edema from acute infarction in  the right MCA distribution with unchanged extent when compared to prior diffusion imaging. Remote small vessel infarct centered at the left caudate head. Small vessel ischemic change in the cerebral white matter. No hydrocephalus, collection, or masslike finding Vascular: Negative. Skull: Negative Sinuses/Orbits: Negative IMPRESSION: Right MCA branch infarction without progression or hemorrhage when compared to brain MRI yesterday. Electronically Signed   By: Monte Fantasia M.D.   On: 12/11/2018 04:31   Ct Head Wo Contrast  Result Date: 12/10/2018 CLINICAL DATA:  Headache. EXAM: CT HEAD WITHOUT CONTRAST TECHNIQUE: Contiguous axial images were obtained from the base of the skull through the vertex without intravenous contrast. COMPARISON:  None. FINDINGS: Brain: The brainstem and cerebellum are normal. There is acute infarction in the right parietal cortical and subcortical brain with mild swelling and petechial blood products. No frank hematoma. This is probably at least several days old. Elsewhere, there are old infarctions in the left basal ganglia in there are mild chronic small-vessel changes of the hemispheric white matter. No mass lesion, hemorrhage, hydrocephalus or extra-axial collection. Vascular: There is atherosclerotic calcification of the major vessels at  the base of the brain. Skull: Negative Sinuses/Orbits: Clear/normal Other: None IMPRESSION: Acute/subacute infarction in the right parietal lobe, probably several days old. This is consistent with right MCA branch vessel infarction. Petechial blood products without frank hematoma. No mass effect. 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 Chronic small-vessel ischemic changes of the cerebral hemispheric white matter. Electronically Signed   By: Paulina FusiMark  Shogry M.D.   On: 12/10/2018 09:36   Mr Angio Neck W Wo Contrast  Result Date: 12/10/2018 CLINICAL DATA:  Hypertension, diabetes and hyperlipidemia. Headache. Right parietal infarction.  EXAM: MRI HEAD WITHOUT CONTRAST MRA NECK WITHOUT AND WITH CONTRAST TECHNIQUE: Multiplanar, multiecho pulse sequences of the brain and surrounding structures were obtained without intravenous contrast. Angiographic images of the neck were obtained using MRA technique with and without intravenous contrast. Carotid stenosis measurements (when applicable) are obtained utilizing NASCET criteria, using the distal internal carotid diameter as the denominator. CONTRAST:  10 cc Gadavist COMPARISON:  Head CT earlier same day. FINDINGS: MRI HEAD FINDINGS Brain: Acute infarction in the right MCA territory affecting the posterolateral temporal lobe and parietal lobe. Minimal petechial blood products at the upper margin of the infarction. No frank hematoma. Separate 7 mm acute infarction in the right posterior frontal white matter. No other acute insult. Elsewhere, the brainstem and cerebellum are normal. Cerebral hemispheres show mild chronic small-vessel change of the white matter. Single small infarction in each thalamus. Old infarction in the left caudate head. No mass lesion, hydrocephalus or extra-axial collection. Vascular: Major vessels at the base of the brain show flow. Skull and upper cervical spine: Negative Sinuses/Orbits: Clear/normal Other: None MRA NECK FINDINGS Aorta appears normal. Branching pattern is normal with bovine origin. Both common carotid arteries are widely patent to their respective bifurcation. Minimal atherosclerotic narrowing at both proximal internal carotid arteries, but no measurable stenosis when compared to the more distal cervical ICA diameter. Both vertebral arteries are widely patent with the left being dominant. No origin stenosis. Both vertebrals reach the basilar. IMPRESSION: Acute right MCA branch vessel infarction affecting the posterolateral temporal lobe and parietal lobe. Very minimal petechial blood products at the upper margin of the infarction. No frank hematoma. No mass effect.  Second separate 7 mm acute infarction in the right posterior frontal white matter. Chronic small-vessel ischemic changes elsewhere throughout the brain as outlined above. No flow limiting stenosis in the neck. Minimal atherosclerotic narrowing of the proximal internal carotid arteries, but no stenosis when compared to the more distal cervical ICA diameter. No irregularity visible to serve as an embolic source. Therefore, consider the heart or ascending aorta. Electronically Signed   By: Paulina FusiMark  Shogry M.D.   On: 12/10/2018 15:10   Mr Brain Wo Contrast  Result Date: 12/10/2018 CLINICAL DATA:  Hypertension, diabetes and hyperlipidemia. Headache. Right parietal infarction. EXAM: MRI HEAD WITHOUT CONTRAST MRA NECK WITHOUT AND WITH CONTRAST TECHNIQUE: Multiplanar, multiecho pulse sequences of the brain and surrounding structures were obtained without intravenous contrast. Angiographic images of the neck were obtained using MRA technique with and without intravenous contrast. Carotid stenosis measurements (when applicable) are obtained utilizing NASCET criteria, using the distal internal carotid diameter as the denominator. CONTRAST:  10 cc Gadavist COMPARISON:  Head CT earlier same day. FINDINGS: MRI HEAD FINDINGS Brain: Acute infarction in the right MCA territory affecting the posterolateral temporal lobe and parietal lobe. Minimal petechial blood products at the upper margin of the infarction. No frank hematoma. Separate 7 mm acute infarction in the right posterior frontal white  matter. No other acute insult. Elsewhere, the brainstem and cerebellum are normal. Cerebral hemispheres show mild chronic small-vessel change of the white matter. Single small infarction in each thalamus. Old infarction in the left caudate head. No mass lesion, hydrocephalus or extra-axial collection. Vascular: Major vessels at the base of the brain show flow. Skull and upper cervical spine: Negative Sinuses/Orbits: Clear/normal Other: None  MRA NECK FINDINGS Aorta appears normal. Branching pattern is normal with bovine origin. Both common carotid arteries are widely patent to their respective bifurcation. Minimal atherosclerotic narrowing at both proximal internal carotid arteries, but no measurable stenosis when compared to the more distal cervical ICA diameter. Both vertebral arteries are widely patent with the left being dominant. No origin stenosis. Both vertebrals reach the basilar. IMPRESSION: Acute right MCA branch vessel infarction affecting the posterolateral temporal lobe and parietal lobe. Very minimal petechial blood products at the upper margin of the infarction. No frank hematoma. No mass effect. Second separate 7 mm acute infarction in the right posterior frontal white matter. Chronic small-vessel ischemic changes elsewhere throughout the brain as outlined above. No flow limiting stenosis in the neck. Minimal atherosclerotic narrowing of the proximal internal carotid arteries, but no stenosis when compared to the more distal cervical ICA diameter. No irregularity visible to serve as an embolic source. Therefore, consider the heart or ascending aorta. Electronically Signed   By: Paulina FusiMark  Shogry M.D.   On: 12/10/2018 15:10    Micro Results     Recent Results (from the past 240 hour(s))  SARS CORONAVIRUS 2 Nasal Swab Aptima Multi Swab     Status: None   Collection Time: 12/10/18 10:22 AM   Specimen: Aptima Multi Swab; Nasal Swab  Result Value Ref Range Status   SARS Coronavirus 2 NEGATIVE NEGATIVE Final    Comment: (NOTE) SARS-CoV-2 target nucleic acids are NOT DETECTED. The SARS-CoV-2 RNA is generally detectable in upper and lower respiratory specimens during the acute phase of infection. Negative results do not preclude SARS-CoV-2 infection, do not rule out co-infections with other pathogens, and should not be used as the sole basis for treatment or other patient management decisions. Negative results must be combined  with clinical observations, patient history, and epidemiological information. The expected result is Negative. Fact Sheet for Patients: HairSlick.nohttps://www.fda.gov/media/138098/download Fact Sheet for Healthcare Providers: quierodirigir.comhttps://www.fda.gov/media/138095/download This test is not yet approved or cleared by the [place] FDA and  has been authorized for detection and/or diagnosis of SARS-CoV-2 by FDA under an Emergency Use Authorization (EUA). This EUA will remain  in effect (meaning this test can be used) for the duration of the COVID-19 declaration under Section 56 4(b)(1) of the Act, 21 U.S.C. section 360bbb-3(b)(1), unless the authorization is terminated or revoked sooner. Performed at [facility], [address] [place], [place] [address]        Today   Subjective:   Denise Santiago today has no headache,no chest abdominal pain,no new weakness tingling or numbness, feels much better wants to go home today.   Objective:   Blood pressure (!) 181/75, pulse (!) 58, temperature 98.3 F (36.8 C), temperature source Oral, resp. rate 18, height 5\' 2"  (1.575 m), weight 104.3 kg, SpO2 100 %.  No intake or output data in the 24 hours ending 12/11/18 1513  Exam Awake Alert, Oriented x 3, No new F.N deficits, Normal affect [place].AT,PERRAL Supple Neck,No JVD, No cervical lymphadenopathy appriciated.  Symmetrical Chest wall movement, Good air movement bilaterally, CTAB RRR,No Gallops,Rubs or new Murmurs, No Parasternal  Heave +ve B.Sounds, Abd Soft, Non tender, No organomegaly appriciated, No rebound -guarding or rigidity. No Cyanosis, Clubbing or edema, No new Rash or bruise  Data Review   CBC w Diff:  Lab Results  Component Value Date   WBC 5.1 12/10/2018   HGB 15.7 (H) 12/10/2018   HCT 46.1 (H) 12/10/2018   PLT 126 (L) 12/10/2018   LYMPHOPCT 26 12/10/2018   MONOPCT 12 12/10/2018   EOSPCT 0 12/10/2018   BASOPCT 1 12/10/2018    CMP:  Lab Results  Component Value  Date   NA 138 12/10/2018   K 3.5 12/10/2018   CL 96 (L) 12/10/2018   CO2 30 12/10/2018   BUN 14 12/10/2018   CREATININE 1.16 (H) 12/10/2018   PROT 8.1 12/10/2018   ALBUMIN 4.0 12/10/2018   BILITOT 0.7 12/10/2018   ALKPHOS 78 12/10/2018   AST 20 12/10/2018   ALT 13 12/10/2018  .   Total Time in preparing paper work, data evaluation and todays exam - 35 minutes  Katha Hamming M.D on 12/11/2018 at 3:13 PM    Note: This dictation was prepared with Dragon dictation along with smaller phrase technology. Any transcriptional errors that result from this process are unintentional.

## 2018-12-11 NOTE — Progress Notes (Signed)
8/6: No major events overnight   ONG:EXBMW right MCA branch vessel infarction affecting the posterolateral temporal lobe and parietal lobe. Very minimal petechial blood products at the upper margin of the infarction. No frank hematoma. No mass effect.Second separate 7 mm acute infarction in the right posterior frontal white matter.Chronic small-vessel ischemic changes elsewhere throughout the brainas outlined above.No flow limiting stenosis in the neck. Minimal atheroscleroticnarrowing of the proximal internal carotid arteries, but no stenosiswhen compared to the more distal cervical ICA diameter. No irregularity visible to serve as an embolic source. Therefore, consider the heart or ascending aorta.  Echo: see report  Neuro exam remains unchanged and stable Alert, awake, oriented x3, speech nle, following commands CN: PERLA, EOMI, VFF seems full, face symmetrical, face sensation nle, uvula/tongue midline No motor defciit appreciated No sensory deficit appreciated No coordination deficit appreciated Dtr/gait not checked  Recs: - continue neuro protective measures inculding normothermia, nomro glycemia, correct electrolytes/metabolic abnlites, treat infection -Control BP - Control DM -  PT/OT - may start ASA and dvt prophylaxis  . Statin - Neurologically stable. D/c depending on PT/OT recs. and if medically stable.    Chief Complaint: Right MCA stroke HPI:   63 y/o with HTN, DM, hyperlipidemia presents to ER with HA/difficulties with vision, Patient stated she had 2 episodes of right sided HA on Thursday and Friday for which she rook tylenol that minimally helped. During week end patient started having difficulties with her vision on the right eye. States she could not see the whole sentence and was cut on the right. No loc, no weakness, no trauma, no sz like activity, no speech diasturbance Patient  did not improve , went to see her PCP who send her to ER. Head ct showing:  Acute/subacute infarction in the right parietal lobe, probablyseveral days old. This is consistent with right MCA branch vesselinfarction. Petechial blood products without frank hematoma. 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 Chronicsmall-vessel ischemic changes of the cerebral hemispheric whiteMatter. BP in ER 190's. labs wnl  Past Medical History:  Diagnosis Date  . Diabetes mellitus without complication (HCC)   . Hyperlipidemia   . Hypertension   . Obesity     Past Surgical History:  Procedure Laterality Date  . ABDOMINAL HYSTERECTOMY      No family history on file. Social History:  reports that she has been smoking cigarettes. She has been smoking about 1.00 pack per day. She has never used smokeless tobacco. She reports current alcohol use. No history on file for drug.  Allergies:  Allergies  Allergen Reactions  . Sulfur     Medications Prior to Admission  Medication Sig Dispense Refill  . albuterol (PROVENTIL HFA;VENTOLIN HFA) 108 (90 Base) MCG/ACT inhaler Inhale 2 puffs into the lungs every 4 (four) hours as needed for wheezing or shortness of breath. 1 Inhaler 0  . aspirin 81 MG tablet Take 81 mg by mouth daily.    Marland Kitchen atorvastatin (LIPITOR) 10 MG tablet Take 10 mg by mouth daily.    . citalopram (CELEXA) 20 MG tablet Take 10 mg by mouth daily as needed.     . latanoprost (XALATAN) 0.005 % ophthalmic solution Place 1 drop into both eyes at bedtime.    Marland Kitchen lisinopril-hydrochlorothiazide (PRINZIDE,ZESTORETIC) 20-12.5 MG per tablet Take 1 tablet by mouth daily.    . metFORMIN (GLUCOPHAGE) 850 MG tablet Take 850 mg by mouth 2 (two) times daily with a meal.    . torsemide (DEMADEX) 20 MG  tablet Take 10 mg by mouth daily as needed.          Results for orders placed or performed during the hospital encounter of 12/10/18 (from the past 48 hour(s))  Comprehensive metabolic panel     Status: Abnormal   Collection Time: 12/10/18  9:58 AM  Result  Value Ref Range   Sodium 138 135 - 145 mmol/L   Potassium 3.5 3.5 - 5.1 mmol/L   Chloride 96 (L) 98 - 111 mmol/L   CO2 30 22 - 32 mmol/L   Glucose, Bld 126 (H) 70 - 99 mg/dL   BUN 14 8 - 23 mg/dL   Creatinine, Ser 2.131.16 (H) 0.44 - 1.00 mg/dL   Calcium 9.5 8.9 - 08.610.3 mg/dL   Total Protein 8.1 6.5 - 8.1 g/dL   Albumin 4.0 3.5 - 5.0 g/dL   AST 20 15 - 41 U/L   ALT 13 0 - 44 U/L   Alkaline Phosphatase 78 38 - 126 U/L   Total Bilirubin 0.7 0.3 - 1.2 mg/dL   GFR calc non Af Amer 50 (L) >60 mL/min   GFR calc Af Amer 58 (L) >60 mL/min   Anion gap 12 5 - 15    Comment: Performed at [facility], [address] [place], [place] [address]  CBC with Differential     Status: Abnormal   Collection Time: 12/10/18  9:58 AM  Result Value Ref Range   WBC 5.1 4.0 - 10.5 K/uL   RBC 4.97 3.87 - 5.11 MIL/uL   Hemoglobin 15.7 (H) 12.0 - 15.0 g/dL   HCT 96.246.1 (H) 95.236.0 - 84.146.0 %   MCV 92.8 80.0 - 100.0 fL   MCH 31.6 26.0 - 34.0 pg   MCHC 34.1 30.0 - 36.0 g/dL   RDW 32.412.2 40.111.5 - 02.715.5 %   Platelets 126 (L) 150 - 400 K/uL   nRBC 0.0 0.0 - 0.2 %   Neutrophils Relative % 61 %   Neutro Abs 3.0 1.7 - 7.7 K/uL   Lymphocytes Relative 26 %   Lymphs Abs 1.3 0.7 - 4.0 K/uL   Monocytes Relative 12 %   Monocytes Absolute 0.6 0.1 - 1.0 K/uL   Eosinophils Relative 0 %   Eosinophils Absolute 0.0 0.0 - 0.5 K/uL   Basophils Relative 1 %   Basophils Absolute 0.1 0.0 - 0.1 K/uL   Immature Granulocytes 0 %   Abs Immature Granulocytes 0.01 0.00 - 0.07 K/uL    Comment: Performed at [facility], [address] [place], [place] [address]  Protime-INR     Status: None   Collection Time: 12/10/18  9:58 AM  Result Value Ref Range   Prothrombin Time 13.7 11.4 - 15.2 seconds   INR 1.1 0.8 - 1.2    Comment: (NOTE) INR goal varies based on device and disease states. Performed at [facility], [address] [place], [place] [address]   APTT     Status: None   Collection Time: 12/10/18   9:58 AM  Result Value Ref Range   aPTT 35 24 - 36 seconds    Comment: Performed at [facility], [address] [place], [place] [address]  Hemoglobin A1c     Status: Abnormal   Collection Time: 12/10/18  9:58 AM  Result Value Ref Range   Hgb A1c MFr Bld 5.9 (H) 4.8 - 5.6 %    Comment: (NOTE) Pre diabetes:          5.7%-6.4% Diabetes:              >  6.4% Glycemic control for   <7.0% adults with diabetes    Mean Plasma Glucose 122.63 mg/dL    Comment: Performed at [facility], [address] [place], [place] [address]  Lipid panel     Status: Abnormal   Collection Time: 12/10/18  9:58 AM  Result Value Ref Range   Cholesterol 162 0 - 200 mg/dL   Triglycerides 51 <604<150 mg/dL   HDL 39 (L) >54>40 mg/dL   Total CHOL/HDL Ratio 4.2 RATIO   VLDL 10 0 - 40 mg/dL   LDL Cholesterol 098113 (H) 0 - 99 mg/dL    Comment:        Total Cholesterol/HDL:CHD Risk Coronary Heart Disease Risk Table                     Men   Women  1/2 Average Risk   3.4   3.3  Average Risk       5.0   4.4  2 X Average Risk   9.6   7.1  3 X Average Risk  23.4   11.0        Use the calculated Patient Ratio above and the CHD Risk Table to determine the patient's CHD Risk.        ATP III CLASSIFICATION (LDL):  <100     mg/dL   Optimal  119-147100-129  mg/dL   Near or Above                    Optimal  130-159  mg/dL   Borderline  [id number]  mg/dL   High  >130>190     mg/dL   Very High Performed at [facility], [address] [place], [place] [address]   SARS CORONAVIRUS 2 Nasal Swab Aptima Multi Swab     Status: None   Collection Time: 12/10/18 10:22 AM   Specimen: Aptima Multi Swab; Nasal Swab  Result Value Ref Range   SARS Coronavirus 2 NEGATIVE NEGATIVE    Comment: (NOTE) SARS-CoV-2 target nucleic acids are NOT DETECTED. The SARS-CoV-2 RNA is generally detectable in upper and lower respiratory specimens during the acute phase of infection. Negative results do not preclude SARS-CoV-2  infection, do not rule out co-infections with other pathogens, and should not be used as the sole basis for treatment or other patient management decisions. Negative results must be combined with clinical observations, patient history, and epidemiological information. The expected result is Negative. Fact Sheet for Patients: HairSlick.nohttps://www.fda.gov/media/138098/download Fact Sheet for Healthcare Providers: quierodirigir.comhttps://www.fda.gov/media/138095/download This test is not yet approved or cleared by the [place] FDA and  has been authorized for detection and/or diagnosis of SARS-CoV-2 by FDA under an Emergency Use Authorization (EUA). This EUA will remain  in effect (meaning this test can be used) for the duration of the COVID-19 declaration under Section 56 4(b)(1) of the Act, 21 U.S.C. section 360bbb-3(b)(1), unless the authorization is terminated or revoked sooner. Performed at [facility], [address] [place], [place] [address]   Glucose, capillary     Status: Abnormal   Collection Time: 12/10/18 12:35 PM  Result Value Ref Range   Glucose-Capillary 108 (H) 70 - 99 mg/dL  HIV antibody (Routine Testing)     Status: None   Collection Time: 12/10/18  1:35 PM  Result Value Ref Range   HIV Screen 4th Generation wRfx Non Reactive Non Reactive    Comment: (NOTE) Performed At: [facility] [address] [place], [place]  [address] Jolene Schimke MD WU:[id number]   Hemoglobin A1c     Status: Abnormal   Collection Time: 12/10/18  1:35 PM  Result Value Ref Range   Hgb A1c MFr Bld 6.0 (H) 4.8 - 5.6 %    Comment: (NOTE) Pre diabetes:          5.7%-6.4% Diabetes:              >6.4% Glycemic control for   <7.0% adults with diabetes    Mean Plasma Glucose 125.5 mg/dL    Comment: Performed at [facility], [address] [place], [place] [address]  Glucose, capillary     Status: Abnormal   Collection Time: 12/10/18  5:36 PM  Result Value Ref Range    Glucose-Capillary 138 (H) 70 - 99 mg/dL  Glucose, capillary     Status: Abnormal   Collection Time: 12/10/18  8:48 PM  Result Value Ref Range   Glucose-Capillary 154 (H) 70 - 99 mg/dL  Lipid panel     Status: Abnormal   Collection Time: 12/11/18  3:53 AM  Result Value Ref Range   Cholesterol 137 0 - 200 mg/dL   Triglycerides 52 <308 mg/dL   HDL 33 (L) >65 mg/dL   Total CHOL/HDL Ratio 4.2 RATIO   VLDL 10 0 - 40 mg/dL   LDL Cholesterol 94 0 - 99 mg/dL    Comment:        Total Cholesterol/HDL:CHD Risk Coronary Heart Disease Risk Table                     Men   Women  1/2 Average Risk   3.4   3.3  Average Risk       5.0   4.4  2 X Average Risk   9.6   7.1  3 X Average Risk  23.4   11.0        Use the calculated Patient Ratio above and the CHD Risk Table to determine the patient's CHD Risk.        ATP III CLASSIFICATION (LDL):  <100     mg/dL   Optimal  784-696  mg/dL   Near or Above                    Optimal  130-159  mg/dL   Borderline  295-284  mg/dL   High  >132     mg/dL   Very High Performed at [facility], [address] [place], [place] [address]   Glucose, capillary     Status: Abnormal   Collection Time: 12/11/18  7:46 AM  Result Value Ref Range   Glucose-Capillary 141 (H) 70 - 99 mg/dL   Ct Head Wo Contrast  Result Date: 12/11/2018 CLINICAL DATA:  Stroke follow-up EXAM: CT HEAD WITHOUT CONTRAST TECHNIQUE: Contiguous axial images were obtained from the base of the skull through the vertex without intravenous contrast. COMPARISON:  Brain MRI from yesterday FINDINGS: Brain: Cytotoxic edema from acute infarction in the right MCA distribution with unchanged extent when compared to prior diffusion imaging. Remote small vessel infarct centered at the left caudate head. Small vessel ischemic change in the cerebral white matter. No hydrocephalus, collection, or masslike finding Vascular: Negative. Skull: Negative Sinuses/Orbits: Negative IMPRESSION: Right MCA  branch infarction without progression or hemorrhage when compared to brain MRI yesterday. Electronically Signed   By: Marnee Spring M.D.   On: 12/11/2018 04:31   Ct Head Wo Contrast  Result  Date: 12/10/2018 CLINICAL DATA:  Headache. EXAM: CT HEAD WITHOUT CONTRAST TECHNIQUE: Contiguous axial images were obtained from the base of the skull through the vertex without intravenous contrast. COMPARISON:  None. FINDINGS: Brain: The brainstem and cerebellum are normal. There is acute infarction in the right parietal cortical and subcortical brain with mild swelling and petechial blood products. No frank hematoma. This is probably at least several days old. Elsewhere, there are old infarctions in the left basal ganglia in there are mild chronic small-vessel changes of the hemispheric white matter. No mass lesion, hemorrhage, hydrocephalus or extra-axial collection. Vascular: There is atherosclerotic calcification of the major vessels at the base of the brain. Skull: Negative Sinuses/Orbits: Clear/normal Other: None IMPRESSION: Acute/subacute infarction in the right parietal lobe, probably several days old. This is consistent with right MCA branch vessel infarction. Petechial blood products without frank hematoma. No mass effect. 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 Chronic small-vessel ischemic changes of the cerebral hemispheric white matter. Electronically Signed   By: Nelson Chimes M.D.   On: 12/10/2018 09:36   Mr Angio Neck W Wo Contrast  Result Date: 12/10/2018 CLINICAL DATA:  Hypertension, diabetes and hyperlipidemia. Headache. Right parietal infarction. EXAM: MRI HEAD WITHOUT CONTRAST MRA NECK WITHOUT AND WITH CONTRAST TECHNIQUE: Multiplanar, multiecho pulse sequences of the brain and surrounding structures were obtained without intravenous contrast. Angiographic images of the neck were obtained using MRA technique with and without intravenous contrast. Carotid stenosis measurements (when  applicable) are obtained utilizing NASCET criteria, using the distal internal carotid diameter as the denominator. CONTRAST:  10 cc Gadavist COMPARISON:  Head CT earlier same day. FINDINGS: MRI HEAD FINDINGS Brain: Acute infarction in the right MCA territory affecting the posterolateral temporal lobe and parietal lobe. Minimal petechial blood products at the upper margin of the infarction. No frank hematoma. Separate 7 mm acute infarction in the right posterior frontal white matter. No other acute insult. Elsewhere, the brainstem and cerebellum are normal. Cerebral hemispheres show mild chronic small-vessel change of the white matter. Single small infarction in each thalamus. Old infarction in the left caudate head. No mass lesion, hydrocephalus or extra-axial collection. Vascular: Major vessels at the base of the brain show flow. Skull and upper cervical spine: Negative Sinuses/Orbits: Clear/normal Other: None MRA NECK FINDINGS Aorta appears normal. Branching pattern is normal with bovine origin. Both common carotid arteries are widely patent to their respective bifurcation. Minimal atherosclerotic narrowing at both proximal internal carotid arteries, but no measurable stenosis when compared to the more distal cervical ICA diameter. Both vertebral arteries are widely patent with the left being dominant. No origin stenosis. Both vertebrals reach the basilar. IMPRESSION: Acute right MCA branch vessel infarction affecting the posterolateral temporal lobe and parietal lobe. Very minimal petechial blood products at the upper margin of the infarction. No frank hematoma. No mass effect. Second separate 7 mm acute infarction in the right posterior frontal white matter. Chronic small-vessel ischemic changes elsewhere throughout the brain as outlined above. No flow limiting stenosis in the neck. Minimal atherosclerotic narrowing of the proximal internal carotid arteries, but no stenosis when compared to the more distal  cervical ICA diameter. No irregularity visible to serve as an embolic source. Therefore, consider the heart or ascending aorta. Electronically Signed   By: Nelson Chimes M.D.   On: 12/10/2018 15:10   Mr Brain Wo Contrast  Result Date: 12/10/2018 CLINICAL DATA:  Hypertension, diabetes and hyperlipidemia. Headache. Right parietal infarction. EXAM: MRI HEAD WITHOUT CONTRAST MRA NECK WITHOUT  AND WITH CONTRAST TECHNIQUE: Multiplanar, multiecho pulse sequences of the brain and surrounding structures were obtained without intravenous contrast. Angiographic images of the neck were obtained using MRA technique with and without intravenous contrast. Carotid stenosis measurements (when applicable) are obtained utilizing NASCET criteria, using the distal internal carotid diameter as the denominator. CONTRAST:  10 cc Gadavist COMPARISON:  Head CT earlier same day. FINDINGS: MRI HEAD FINDINGS Brain: Acute infarction in the right MCA territory affecting the posterolateral temporal lobe and parietal lobe. Minimal petechial blood products at the upper margin of the infarction. No frank hematoma. Separate 7 mm acute infarction in the right posterior frontal white matter. No other acute insult. Elsewhere, the brainstem and cerebellum are normal. Cerebral hemispheres show mild chronic small-vessel change of the white matter. Single small infarction in each thalamus. Old infarction in the left caudate head. No mass lesion, hydrocephalus or extra-axial collection. Vascular: Major vessels at the base of the brain show flow. Skull and upper cervical spine: Negative Sinuses/Orbits: Clear/normal Other: None MRA NECK FINDINGS Aorta appears normal. Branching pattern is normal with bovine origin. Both common carotid arteries are widely patent to their respective bifurcation. Minimal atherosclerotic narrowing at both proximal internal carotid arteries, but no measurable stenosis when compared to the more distal cervical ICA diameter. Both  vertebral arteries are widely patent with the left being dominant. No origin stenosis. Both vertebrals reach the basilar. IMPRESSION: Acute right MCA branch vessel infarction affecting the posterolateral temporal lobe and parietal lobe. Very minimal petechial blood products at the upper margin of the infarction. No frank hematoma. No mass effect. Second separate 7 mm acute infarction in the right posterior frontal white matter. Chronic small-vessel ischemic changes elsewhere throughout the brain as outlined above. No flow limiting stenosis in the neck. Minimal atherosclerotic narrowing of the proximal internal carotid arteries, but no stenosis when compared to the more distal cervical ICA diameter. No irregularity visible to serve as an embolic source. Therefore, consider the heart or ascending aorta. Electronically Signed   By: Paulina FusiMark  Shogry M.D.   On: 12/10/2018 15:10         12/11/2018, 9:17 AM

## 2019-02-02 ENCOUNTER — Ambulatory Visit
Admission: RE | Admit: 2019-02-02 | Discharge: 2019-02-02 | Disposition: A | Discharge status: Home / Self Care | Payer: No Typology Code available for payment source | Source: Ambulatory Visit | Attending: Internal Medicine | Admitting: Internal Medicine

## 2019-02-02 ENCOUNTER — Other Ambulatory Visit: Payer: Self-pay | Admitting: Internal Medicine

## 2019-02-02 DIAGNOSIS — M25562 Pain in left knee: Secondary | ICD-10-CM

## 2019-02-05 NOTE — Progress Notes (Signed)
NEUROLOGY CONSULTATION NOTE  Denise Santiago MRN: [id number] DOB: 09/01/1955  Referring provider: Renford Dillsonald Polite, MD Primary care provider: Renford Dillsonald Polite, MD  Reason for consult:  CVA  HISTORY OF PRESENT ILLNESS: Denise Santiago is a 63 year old right-handed black female with HTN, type 2 DM, and hyperlipidemia and cigarette smoker (1ppd) who presents for recent CVA.  History supplemented by hospital notes.  CT and MRI in hospital personally reviewed  She presented to [facility] on 12/08/2018 after presenting with right sided headache and right sided visual field loss.  No associated slurred speech, language dysfunction or unilateral numbness or weakness.  She did not want to wait in the ED and left.  However, she returned to the ED on on 12/10/2018 where CT of head showed acute/subacute infarct in right parietal lobe.  Systolic blood pressure was in the 190s.  She was admitted for stroke workup.  Follow up MRI of brain confirmed acute right MCA branch infarct affecting the posterolateral temporal and parietal lobe with minimal petechial blood products but no significant hemorrhage, as well as a small 7 mm acute infarct in the right posterior frontal white matter.  MRA of neck showed minimal atherosclerosis but no hemodynamically significant stenosis.  2D echo showed EF 60-65% with mild concentric left ventricular hypertrophy and moderately dilated left atrium but interatrial septum was not assessed.  Repeat CT head the following day showed no progression or hemorrhage.  Hgb A1c checked during hospital stay was 5.8 to 6.0.  LDL was 94.  She reportedly had been noncompliant with her medications due to lack of insurance.  She was discharged on ASA 81mg , atorvastatin 10mg , metformin, HCTZ and lisinopril.  Following discharge, her SBP remained elevated at 180.  She followed up with her PCP, Dr. Nehemiah SettlePolite, on 01/05/2019 who increased her antihypertensive medications.  She notes fatigue.  She notices that foods that  she previously enjoyed now does not taste good.  She has lost about 15 lbs since discharge from hospital.    PAST MEDICAL HISTORY: Past Medical History:  Diagnosis Date  . Diabetes mellitus without complication (HCC)   . Hyperlipidemia   . Hypertension   . Obesity     PAST SURGICAL HISTORY: Past Surgical History:  Procedure Laterality Date  . ABDOMINAL HYSTERECTOMY      MEDICATIONS: Current Outpatient Medications on File Prior to Visit  Medication Sig Dispense Refill  . albuterol (VENTOLIN HFA) 108 (90 Base) MCG/ACT inhaler Inhale 2 puffs into the lungs every 4 (four) hours as needed for wheezing or shortness of breath. 8 g 0  . aspirin 81 MG tablet Take 1 tablet (81 mg total) by mouth daily. 30 tablet 0  . atorvastatin (LIPITOR) 40 MG tablet Take 1 tablet (40 mg total) by mouth daily. 30 tablet 0  . citalopram (CELEXA) 10 MG tablet Take 1 tablet (10 mg total) by mouth daily. 30 tablet 0  . latanoprost (XALATAN) 0.005 % ophthalmic solution Place 1 drop into both eyes at bedtime. 2.5 mL 12  . lisinopril-hydrochlorothiazide (ZESTORETIC) 20-12.5 MG tablet Take 1 tablet by mouth daily. 60 tablet 0  . metFORMIN (GLUCOPHAGE) 850 MG tablet Take 1 tablet (850 mg total) by mouth 2 (two) times daily with a meal. 60 tablet 0   No current facility-administered medications on file prior to visit.     ALLERGIES: Allergies  Allergen Reactions  . Sulfur     FAMILY HISTORY: History reviewed. No pertinent family history.   SOCIAL HISTORY: Social History   Socioeconomic  History  . Marital status: Single    Spouse name: Not on file  . Number of children: Not on file  . Years of education: Not on file  . Highest education level: Not on file  Occupational History  . Not on file  Social Needs  . Financial resource strain: Not on file  . Food insecurity    Worry: Not on file    Inability: Not on file  . Transportation needs    Medical: Not on file    Non-medical: Not on file   Tobacco Use  . Smoking status: Current Every Day Smoker    Packs/day: 1.00    Types: Cigarettes  . Smokeless tobacco: Never Used  Substance and Sexual Activity  . Alcohol use: Yes    Alcohol/week: 0.0 standard drinks    Comment: red wine per MD suggestion to lower cholesterol  . Drug use: Not on file  . Sexual activity: Not on file  Lifestyle  . Physical activity    Days per week: Not on file    Minutes per session: Not on file  . Stress: Not on file  Relationships  . Social Herbalist on phone: Not on file    Gets together: Not on file    Attends religious service: Not on file    Active member of club or organization: Not on file    Attends meetings of clubs or organizations: Not on file    Relationship status: Not on file  . Intimate partner violence    Fear of current or ex partner: Not on file    Emotionally abused: Not on file    Physically abused: Not on file    Forced sexual activity: Not on file  Other Topics Concern  . Not on file  Social History Narrative  . Not on file    REVIEW OF SYSTEMS: Constitutional: No fevers, chills, or sweats, no generalized fatigue, change in appetite Eyes: No visual changes, double vision, eye pain Ear, nose and throat: No hearing loss, ear pain, nasal congestion, sore throat Cardiovascular: No chest pain, palpitations Respiratory:  No shortness of breath at rest or with exertion, wheezes GastrointestinaI: No nausea, vomiting, diarrhea, abdominal pain, fecal incontinence Genitourinary:  No dysuria, urinary retention or frequency Musculoskeletal:  No neck pain, back pain Integumentary: No rash, pruritus, skin lesions Neurological: as above Psychiatric: No depression, insomnia, anxiety Endocrine: No palpitations, fatigue, diaphoresis, mood swings, change in appetite, change in weight, increased thirst Hematologic/Lymphatic:  No purpura, petechiae. Allergic/Immunologic: no itchy/runny eyes, nasal congestion, recent  allergic reactions, rashes  PHYSICAL EXAM: Blood pressure 129/74, pulse 80, temperature (!) 97 F (36.1 C), resp. rate 12, height 5\' 2"  (1.575 m), weight 213 lb (96.6 kg), SpO2 97 %. General: No acute distress.  Patient appears well-groomed.  Head:  Normocephalic/atraumatic Eyes:  fundi examined but not visualized Neck: supple, no paraspinal tenderness, full range of motion Back: No paraspinal tenderness Heart: regular rate and rhythm Lungs: Clear to auscultation bilaterally. Vascular: No carotid bruits. Neurological Exam: Mental status: alert and oriented to person, place, and time, recent and remote memory intact, fund of knowledge intact, attention and concentration intact, speech fluent and not dysarthric, language intact. Cranial nerves: CN I: not tested CN II: pupils equal, round and reactive to light, visual fields intact CN III, IV, VI:  full range of motion, no nystagmus, no ptosis CN V: facial sensation intact CN VII: upper and lower face symmetric CN VIII: hearing intact CN IX,  X: gag intact, uvula midline CN XI: sternocleidomastoid and trapezius muscles intact CN XII: tongue midline Bulk & Tone: normal, no fasciculations. Motor:  5/5 throughout  Sensation: temperature and vibration sensation intact. Deep Tendon Reflexes:  1+ throughout, toes downgoing.  Finger to nose testing:  Without dysmetria.  Heel to shin:  Without dysmetria.  Gait:  Normal station and stride.  Able to turn and tandem walk. Romberg negative.  IMPRESSION: 1.  Right MCA territory infarct.  It looks embolic, secondary to unknown source.  I think further testing is warranted. 2.  Hypertension 3.  Type 2 diabetes mellitus 4.  Hyperlipidemia 5.  Tobacco abuse  PLAN: 1.  Continue ASA 81mg  daily for secondary stroke prevention 2.  Continue atorvastatin 10mg  daily (LDL goal less than 70) 3.  Optimize blood pressure control 4.  Check CTA of head 5.  Check echocardiogram with bubble study 6.   Mediterranean diet 7.  Routine exercise 8.  Tobacco cessation counseling (CPT 99406):  Tobacco use with history of CAD, stroke, or cancer  - Currently smoking 1 packs/day   - Patient was informed of the dangers of tobacco abuse including stroke, cancer, and MI, as well as benefits of tobacco cessation. - Patient is willing to quit at this time. - Approximately 5 mins were spent counseling patient cessation techniques. We discussed various methods to help quit smoking, including deciding on a date to quit, joining a support group, pharmacological agents- nicotine gum/patch/lozenges, chantix.  - I will reassess her progress at the next follow-up visit 9.  Follow up in 4 months  Thank you for allowing me to take part in the care of this patient.  , DO  CC: , MD

## 2019-02-06 ENCOUNTER — Other Ambulatory Visit: Payer: Self-pay

## 2019-02-06 ENCOUNTER — Other Ambulatory Visit: Payer: Self-pay | Admitting: *Deleted

## 2019-02-06 ENCOUNTER — Ambulatory Visit (INDEPENDENT_AMBULATORY_CARE_PROVIDER_SITE_OTHER): Payer: Self-pay | Admitting: Neurology

## 2019-02-06 ENCOUNTER — Encounter: Payer: Self-pay | Admitting: Neurology

## 2019-02-06 VITALS — BP 129/74 | HR 80 | Temp 97.0°F | Resp 12 | Ht 62.0 in | Wt 213.0 lb

## 2019-02-06 DIAGNOSIS — I10 Essential (primary) hypertension: Secondary | ICD-10-CM

## 2019-02-06 DIAGNOSIS — I63511 Cerebral infarction due to unspecified occlusion or stenosis of right middle cerebral artery: Secondary | ICD-10-CM

## 2019-02-06 DIAGNOSIS — Z720 Tobacco use: Secondary | ICD-10-CM

## 2019-02-06 DIAGNOSIS — E785 Hyperlipidemia, unspecified: Secondary | ICD-10-CM

## 2019-02-06 DIAGNOSIS — I63411 Cerebral infarction due to embolism of right middle cerebral artery: Secondary | ICD-10-CM

## 2019-02-06 DIAGNOSIS — E1169 Type 2 diabetes mellitus with other specified complication: Secondary | ICD-10-CM

## 2019-02-06 NOTE — Patient Instructions (Signed)
1.  Continue aspirin 81mg  daily 2.  Continue atorvastatin, metformin and blood pressure meds 3.  Try to quit smoking 4.  Mediterranean diet (see below) 5.  Will check CTA of head 6.  Will check echocardiogram with bubble study 7.  Check 30 day Holter monitor 8.  Follow up in 4 months.

## 2019-02-12 ENCOUNTER — Encounter (HOSPITAL_COMMUNITY): Payer: Self-pay | Admitting: Neurology

## 2019-02-17 ENCOUNTER — Telehealth (HOSPITAL_COMMUNITY): Payer: Self-pay

## 2019-02-17 NOTE — Telephone Encounter (Signed)
New message    Just an FYI. We have made several attempts to contact this patient including sending a letter to schedule or reschedule their echocardiogram. We will be removing the patient from the echo WQ.   10.12.20 @ 9:52am lm on cell phone vm  - Hikeem Andersson  10.8.20 @ 2:06pm lm on cell vm  - mail reminder letter Rajon Bisig  10.5.20 @ 11:58am lm on home vm - Lynnell Fiumara

## 2019-03-18 ENCOUNTER — Encounter (HOSPITAL_COMMUNITY): Payer: Self-pay

## 2019-03-18 ENCOUNTER — Emergency Department (HOSPITAL_COMMUNITY): Payer: Self-pay

## 2019-03-18 ENCOUNTER — Inpatient Hospital Stay (HOSPITAL_COMMUNITY)
Admission: EM | Admit: 2019-03-18 | Discharge: 2019-03-21 | DRG: 041 | Disposition: A | Discharge status: Home / Self Care | Payer: Self-pay | Attending: Family Medicine | Admitting: Family Medicine

## 2019-03-18 DIAGNOSIS — G8191 Hemiplegia, unspecified affecting right dominant side: Secondary | ICD-10-CM

## 2019-03-18 DIAGNOSIS — F17210 Nicotine dependence, cigarettes, uncomplicated: Secondary | ICD-10-CM | POA: Diagnosis present

## 2019-03-18 DIAGNOSIS — Z7984 Long term (current) use of oral hypoglycemic drugs: Secondary | ICD-10-CM

## 2019-03-18 DIAGNOSIS — R7989 Other specified abnormal findings of blood chemistry: Secondary | ICD-10-CM | POA: Diagnosis present

## 2019-03-18 DIAGNOSIS — E876 Hypokalemia: Secondary | ICD-10-CM | POA: Diagnosis present

## 2019-03-18 DIAGNOSIS — I10 Essential (primary) hypertension: Secondary | ICD-10-CM | POA: Diagnosis present

## 2019-03-18 DIAGNOSIS — R29700 NIHSS score 0: Secondary | ICD-10-CM | POA: Diagnosis present

## 2019-03-18 DIAGNOSIS — G459 Transient cerebral ischemic attack, unspecified: Secondary | ICD-10-CM

## 2019-03-18 DIAGNOSIS — I119 Hypertensive heart disease without heart failure: Secondary | ICD-10-CM | POA: Diagnosis present

## 2019-03-18 DIAGNOSIS — R4781 Slurred speech: Secondary | ICD-10-CM | POA: Diagnosis present

## 2019-03-18 DIAGNOSIS — Z79899 Other long term (current) drug therapy: Secondary | ICD-10-CM

## 2019-03-18 DIAGNOSIS — I63511 Cerebral infarction due to unspecified occlusion or stenosis of right middle cerebral artery: Secondary | ICD-10-CM

## 2019-03-18 DIAGNOSIS — Z20828 Contact with and (suspected) exposure to other viral communicable diseases: Secondary | ICD-10-CM | POA: Diagnosis present

## 2019-03-18 DIAGNOSIS — G8194 Hemiplegia, unspecified affecting left nondominant side: Secondary | ICD-10-CM | POA: Diagnosis present

## 2019-03-18 DIAGNOSIS — I6340 Cerebral infarction due to embolism of unspecified cerebral artery: Secondary | ICD-10-CM | POA: Insufficient documentation

## 2019-03-18 DIAGNOSIS — E785 Hyperlipidemia, unspecified: Secondary | ICD-10-CM | POA: Diagnosis present

## 2019-03-18 DIAGNOSIS — E119 Type 2 diabetes mellitus without complications: Secondary | ICD-10-CM

## 2019-03-18 DIAGNOSIS — I63411 Cerebral infarction due to embolism of right middle cerebral artery: Principal | ICD-10-CM | POA: Diagnosis present

## 2019-03-18 DIAGNOSIS — E1165 Type 2 diabetes mellitus with hyperglycemia: Secondary | ICD-10-CM | POA: Diagnosis present

## 2019-03-18 DIAGNOSIS — Z6841 Body Mass Index (BMI) 40.0 and over, adult: Secondary | ICD-10-CM

## 2019-03-18 DIAGNOSIS — Z9114 Patient's other noncompliance with medication regimen: Secondary | ICD-10-CM

## 2019-03-18 DIAGNOSIS — I639 Cerebral infarction, unspecified: Secondary | ICD-10-CM | POA: Insufficient documentation

## 2019-03-18 DIAGNOSIS — R29810 Facial weakness: Secondary | ICD-10-CM | POA: Diagnosis present

## 2019-03-18 DIAGNOSIS — Z7982 Long term (current) use of aspirin: Secondary | ICD-10-CM

## 2019-03-18 LAB — RAPID URINE DRUG SCREEN, HOSP PERFORMED
Amphetamines: NOT DETECTED
Barbiturates: NOT DETECTED
Benzodiazepines: NOT DETECTED
Cocaine: NOT DETECTED
Opiates: NOT DETECTED
Tetrahydrocannabinol: NOT DETECTED

## 2019-03-18 LAB — COMPREHENSIVE METABOLIC PANEL
ALT: 16 U/L (ref 0–44)
AST: 18 U/L (ref 15–41)
Albumin: 3.4 g/dL — ABNORMAL LOW (ref 3.5–5.0)
Alkaline Phosphatase: 76 U/L (ref 38–126)
Anion gap: 12 (ref 5–15)
BUN: 11 mg/dL (ref 8–23)
CO2: 26 mmol/L (ref 22–32)
Calcium: 9.3 mg/dL (ref 8.9–10.3)
Chloride: 97 mmol/L — ABNORMAL LOW (ref 98–111)
Creatinine, Ser: 1.27 mg/dL — ABNORMAL HIGH (ref 0.44–1.00)
GFR calc Af Amer: 52 mL/min — ABNORMAL LOW (ref >60)
GFR calc non Af Amer: 45 mL/min — ABNORMAL LOW (ref >60)
Glucose, Bld: 135 mg/dL — ABNORMAL HIGH (ref 70–99)
Potassium: 2.7 mmol/L — CL (ref 3.5–5.1)
Sodium: 135 mmol/L (ref 135–145)
Total Bilirubin: 0.7 mg/dL (ref 0.3–1.2)
Total Protein: 7.2 g/dL (ref 6.5–8.1)

## 2019-03-18 LAB — CBC WITH DIFFERENTIAL/PLATELET
Abs Immature Granulocytes: 0.01 10*3/uL (ref 0.00–0.07)
Basophils Absolute: 0 10*3/uL (ref 0.0–0.1)
Basophils Relative: 1 %
Eosinophils Absolute: 0 10*3/uL (ref 0.0–0.5)
Eosinophils Relative: 0 %
HCT: 40.5 % (ref 36.0–46.0)
Hemoglobin: 13.9 g/dL (ref 12.0–15.0)
Immature Granulocytes: 0 %
Lymphocytes Relative: 28 %
Lymphs Abs: 1.5 10*3/uL (ref 0.7–4.0)
MCH: 32.5 pg (ref 26.0–34.0)
MCHC: 34.3 g/dL (ref 30.0–36.0)
MCV: 94.6 fL (ref 80.0–100.0)
Monocytes Absolute: 0.4 10*3/uL (ref 0.1–1.0)
Monocytes Relative: 8 %
Neutro Abs: 3.3 10*3/uL (ref 1.7–7.7)
Neutrophils Relative %: 63 %
Platelets: 142 10*3/uL — ABNORMAL LOW (ref 150–400)
RBC: 4.28 MIL/uL (ref 3.87–5.11)
RDW: 12.4 % (ref 11.5–15.5)
WBC: 5.3 10*3/uL (ref 4.0–10.5)
nRBC: 0 % (ref 0.0–0.2)

## 2019-03-18 LAB — URINALYSIS, ROUTINE W REFLEX MICROSCOPIC
Bilirubin Urine: NEGATIVE
Glucose, UA: NEGATIVE mg/dL
Hgb urine dipstick: NEGATIVE
Ketones, ur: NEGATIVE mg/dL
Leukocytes,Ua: NEGATIVE
Nitrite: NEGATIVE
Protein, ur: NEGATIVE mg/dL
Specific Gravity, Urine: 1.009 (ref 1.005–1.030)
pH: 7 (ref 5.0–8.0)

## 2019-03-18 LAB — TROPONIN I (HIGH SENSITIVITY)
Troponin I (High Sensitivity): 5 ng/L (ref <18)
Troponin I (High Sensitivity): 7 ng/L (ref <18)

## 2019-03-18 LAB — PROTIME-INR
INR: 1.1 (ref 0.8–1.2)
Prothrombin Time: 13.6 seconds (ref 11.4–15.2)

## 2019-03-18 LAB — CBG MONITORING, ED: Glucose-Capillary: 120 mg/dL — ABNORMAL HIGH (ref 70–99)

## 2019-03-18 LAB — ETHANOL: Alcohol, Ethyl (B): <10 mg/dL (ref <10)

## 2019-03-18 MED ORDER — POTASSIUM CHLORIDE CRYS ER 20 MEQ PO TBCR
40.0000 meq | EXTENDED_RELEASE_TABLET | Freq: Once | ORAL | Status: AC
  Administered 2019-03-18: 40 meq via ORAL
  Filled 2019-03-18: qty 2
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
## 2019-03-19 ENCOUNTER — Other Ambulatory Visit: Payer: Self-pay

## 2019-03-19 ENCOUNTER — Ambulatory Visit (HOSPITAL_COMMUNITY): Payer: Self-pay

## 2019-03-19 ENCOUNTER — Observation Stay (HOSPITAL_COMMUNITY): Payer: Self-pay

## 2019-03-19 ENCOUNTER — Inpatient Hospital Stay (HOSPITAL_COMMUNITY): Payer: Self-pay

## 2019-03-19 DIAGNOSIS — G8194 Hemiplegia, unspecified affecting left nondominant side: Secondary | ICD-10-CM

## 2019-03-19 DIAGNOSIS — I6389 Other cerebral infarction: Secondary | ICD-10-CM

## 2019-03-19 DIAGNOSIS — I10 Essential (primary) hypertension: Secondary | ICD-10-CM | POA: Diagnosis present

## 2019-03-19 DIAGNOSIS — I6310 Cerebral infarction due to embolism of unspecified precerebral artery: Secondary | ICD-10-CM

## 2019-03-19 DIAGNOSIS — E119 Type 2 diabetes mellitus without complications: Secondary | ICD-10-CM

## 2019-03-19 DIAGNOSIS — G8191 Hemiplegia, unspecified affecting right dominant side: Secondary | ICD-10-CM

## 2019-03-19 DIAGNOSIS — I6340 Cerebral infarction due to embolism of unspecified cerebral artery: Secondary | ICD-10-CM | POA: Insufficient documentation

## 2019-03-19 DIAGNOSIS — I639 Cerebral infarction, unspecified: Secondary | ICD-10-CM | POA: Insufficient documentation

## 2019-03-19 LAB — URIC ACID: Uric Acid, Serum: 6.8 mg/dL (ref 2.5–7.1)

## 2019-03-19 LAB — BASIC METABOLIC PANEL
Anion gap: 11 (ref 5–15)
BUN: 10 mg/dL (ref 8–23)
CO2: 25 mmol/L (ref 22–32)
Calcium: 9 mg/dL (ref 8.9–10.3)
Chloride: 102 mmol/L (ref 98–111)
Creatinine, Ser: 1.05 mg/dL — ABNORMAL HIGH (ref 0.44–1.00)
GFR calc Af Amer: >60 mL/min (ref >60)
GFR calc non Af Amer: 56 mL/min — ABNORMAL LOW (ref >60)
Glucose, Bld: 121 mg/dL — ABNORMAL HIGH (ref 70–99)
Potassium: 3.4 mmol/L — ABNORMAL LOW (ref 3.5–5.1)
Sodium: 138 mmol/L (ref 135–145)

## 2019-03-19 LAB — APTT: aPTT: 34 seconds (ref 24–36)

## 2019-03-19 LAB — MAGNESIUM: Magnesium: 2.1 mg/dL (ref 1.7–2.4)

## 2019-03-19 LAB — HEMOGLOBIN A1C
Hgb A1c MFr Bld: 6.4 % — ABNORMAL HIGH (ref 4.8–5.6)
Mean Plasma Glucose: 136.98 mg/dL

## 2019-03-19 LAB — ECHOCARDIOGRAM LIMITED
Height: 62 in
Weight: 3408 oz

## 2019-03-19 LAB — SARS CORONAVIRUS 2 (TAT 6-24 HRS): SARS Coronavirus 2: NEGATIVE

## 2019-03-19 LAB — LACTATE DEHYDROGENASE: LDH: 160 U/L (ref 98–192)

## 2019-03-19 LAB — GLUCOSE, CAPILLARY: Glucose-Capillary: 169 mg/dL — ABNORMAL HIGH (ref 70–99)

## 2019-03-19 LAB — PHOSPHORUS: Phosphorus: 2.1 mg/dL — ABNORMAL LOW (ref 2.5–4.6)

## 2019-03-19 MED ORDER — METFORMIN HCL 850 MG PO TABS
850.0000 mg | ORAL_TABLET | Freq: Every day | ORAL | Status: DC
  Administered 2019-03-19 – 2019-03-21 (×3): 850 mg via ORAL
  Filled 2019-03-19 (×5): qty 1

## 2019-03-19 MED ORDER — SODIUM CHLORIDE 0.9% FLUSH: 3.0000 mL | INTRAVENOUS | Status: DC | PRN

## 2019-03-19 MED ORDER — CLOPIDOGREL BISULFATE 75 MG PO TABS
75.0000 mg | ORAL_TABLET | Freq: Every day | ORAL | Status: DC
  Administered 2019-03-19: 75 mg via ORAL
  Filled 2019-03-19: qty 1

## 2019-03-19 MED ORDER — IOHEXOL 350 MG/ML SOLN
80.0000 mL | Freq: Once | INTRAVENOUS | Status: AC | PRN
  Administered 2019-03-19: 80 mL via INTRAVENOUS

## 2019-03-19 MED ORDER — LISINOPRIL-HYDROCHLOROTHIAZIDE 20-12.5 MG PO TABS: 1.0000 | ORAL_TABLET | Freq: Every day | ORAL | Status: DC

## 2019-03-19 MED ORDER — SODIUM CHLORIDE 0.9% FLUSH
3.0000 mL | Freq: Two times a day (BID) | INTRAVENOUS | Status: DC
  Administered 2019-03-19 – 2019-03-21 (×5): 3 mL via INTRAVENOUS

## 2019-03-19 MED ORDER — STUDY - AXIOMATIC STUDY - ASPIRIN 100 MG (PI-SETHI)
100.0000 mg | ORAL_TABLET | Freq: Every day | ORAL | Status: DC
  Administered 2019-03-19 – 2019-03-21 (×3): 100 mg via ORAL
  Filled 2019-03-19 (×3): qty 100

## 2019-03-19 MED ORDER — ENOXAPARIN SODIUM 40 MG/0.4ML ~~LOC~~ SOLN
40.0000 mg | SUBCUTANEOUS | Status: DC
  Administered 2019-03-19: 40 mg via SUBCUTANEOUS
  Filled 2019-03-19: qty 0.4

## 2019-03-19 MED ORDER — LISINOPRIL 20 MG PO TABS
20.0000 mg | ORAL_TABLET | Freq: Every day | ORAL | Status: DC
  Administered 2019-03-19: 20 mg via ORAL
  Filled 2019-03-19 (×2): qty 1

## 2019-03-19 MED ORDER — SODIUM CHLORIDE 0.9 % IV SOLN
INTRAVENOUS | Status: DC
  Administered 2019-03-19: 23:00:00 via INTRAVENOUS

## 2019-03-19 MED ORDER — GADOBUTROL 1 MMOL/ML IV SOLN
10.0000 mL | Freq: Once | INTRAVENOUS | Status: AC | PRN
  Administered 2019-03-19: 10 mL via INTRAVENOUS

## 2019-03-19 MED ORDER — POTASSIUM CHLORIDE CRYS ER 20 MEQ PO TBCR
20.0000 meq | EXTENDED_RELEASE_TABLET | Freq: Two times a day (BID) | ORAL | Status: AC
  Administered 2019-03-19: 20 meq via ORAL
  Filled 2019-03-19: qty 1

## 2019-03-19 MED ORDER — STUDY - AXIOMATIC STUDY - CLOPIDOGREL 75MG (PI-SETHI)
75.0000 mg | ORAL_TABLET | Freq: Every day | ORAL | Status: DC
  Administered 2019-03-20 – 2019-03-21 (×2): 75 mg via ORAL
  Filled 2019-03-19 (×4): qty 75

## 2019-03-19 MED ORDER — STUDY - AXIOMATIC STUDY - BMS986177 OR PLACEBO (PI-SETHI)
2.0000 | ORAL_CAPSULE | Freq: Two times a day (BID) | ORAL | Status: DC
  Administered 2019-03-19 – 2019-03-21 (×4): 2 via ORAL
  Filled 2019-03-19 (×5): qty 2

## 2019-03-19 MED ORDER — ACETAMINOPHEN 325 MG PO TABS
650.0000 mg | ORAL_TABLET | Freq: Four times a day (QID) | ORAL | Status: DC | PRN
  Administered 2019-03-21: 650 mg via ORAL
  Filled 2019-03-19: qty 2

## 2019-03-19 MED ORDER — STUDY - AXIOMATIC STUDY - CLOPIDOGREL 75MG (PI-SETHI)
300.0000 mg | ORAL_TABLET | Freq: Once | ORAL | Status: AC
  Administered 2019-03-19: 300 mg via ORAL
  Filled 2019-03-19: qty 300

## 2019-03-19 MED ORDER — SODIUM CHLORIDE 0.9 % IV SOLN: 250.0000 mL | INTRAVENOUS | Status: DC | PRN

## 2019-03-19 MED ORDER — HYDROMORPHONE HCL 1 MG/ML IJ SOLN
1.0000 mg | Freq: Once | INTRAMUSCULAR | Status: AC
  Administered 2019-03-19: 1 mg via INTRAVENOUS
  Filled 2019-03-19: qty 1

## 2019-03-19 MED ORDER — HYDROCHLOROTHIAZIDE 12.5 MG PO CAPS
12.5000 mg | ORAL_CAPSULE | Freq: Every day | ORAL | Status: DC
  Administered 2019-03-19: 12.5 mg via ORAL
  Filled 2019-03-19 (×2): qty 1

## 2019-03-19 MED ORDER — ATORVASTATIN CALCIUM 40 MG PO TABS
40.0000 mg | ORAL_TABLET | Freq: Every day | ORAL | Status: DC
  Administered 2019-03-19 – 2019-03-21 (×3): 40 mg via ORAL
  Filled 2019-03-19 (×3): qty 1

## 2019-03-19 MED ORDER — CITALOPRAM HYDROBROMIDE 10 MG PO TABS
10.0000 mg | ORAL_TABLET | Freq: Every day | ORAL | Status: DC
  Administered 2019-03-19 – 2019-03-21 (×3): 10 mg via ORAL
  Filled 2019-03-19 (×3): qty 1

## 2019-03-19 MED ORDER — ASPIRIN 81 MG PO CHEW
81.0000 mg | CHEWABLE_TABLET | Freq: Every day | ORAL | Status: DC
  Administered 2019-03-19: 81 mg via ORAL
  Filled 2019-03-19 (×2): qty 1

## 2019-03-19 MED ORDER — POTASSIUM CHLORIDE CRYS ER 20 MEQ PO TBCR
20.0000 meq | EXTENDED_RELEASE_TABLET | Freq: Two times a day (BID) | ORAL | Status: DC
  Administered 2019-03-19 (×2): 20 meq via ORAL
  Filled 2019-03-19 (×2): qty 1

## 2019-03-19 MED ORDER — ACETAMINOPHEN 650 MG RE SUPP: 650.0000 mg | Freq: Four times a day (QID) | RECTAL | Status: DC | PRN

## 2019-03-19 NOTE — Progress Notes (Signed)
  Echocardiogram 2D Echocardiogram has been performed.  Denise Santiago 03/19/2019, 6:20 PM
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
## 2019-03-19 NOTE — Progress Notes (Signed)
TCD bubble study completed. Khi Mcmillen, BS, RDMS, RVT  
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
## 2019-03-19 NOTE — ED Notes (Signed)
Clinical research assistant for trial that patient is in with Dr. Leonie Man, currently at bedside

## 2019-03-19 NOTE — ED Notes (Signed)
Patient transported to CT 

## 2019-03-19 NOTE — ED Notes (Signed)
MS   Breakfast ordered  
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
## 2019-03-19 NOTE — ED Notes (Signed)
Pt returned from MRI °

## 2019-03-19 NOTE — ED Notes (Signed)
Patient transported to MRI 

## 2019-03-19 NOTE — ED Notes (Signed)
[place] pts sister... would like to speak with RN in sisters care. If no answer for some reason leave a voicemail her phone isn't working properly

## 2019-03-19 NOTE — ED Notes (Signed)
Dinner tray set up at bedside

## 2019-03-19 NOTE — Progress Notes (Signed)
    [facility] has been requested to perform a transesophageal echocardiogram on Savilla Bostrom for stroke.  After careful review of history and examination, the risks and benefits of transesophageal echocardiogram have been explained including risks of esophageal damage, perforation (1:10,000 risk), bleeding, pharyngeal hematoma as well as other potential complications associated with conscious sedation including aspiration, arrhythmia, respiratory failure and death. Alternatives to treatment were discussed, questions were answered. Patient is willing to proceed.   Gabor Lusk Ninfa Meeker, PA-C  03/19/2019 3:06 PM
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
## 2019-03-20 ENCOUNTER — Inpatient Hospital Stay (HOSPITAL_COMMUNITY): Payer: Self-pay | Admitting: Certified Registered Nurse Anesthetist

## 2019-03-20 ENCOUNTER — Encounter (HOSPITAL_COMMUNITY)
Admission: EM | Disposition: A | Discharge status: Home / Self Care | Payer: Self-pay | Source: Home / Self Care | Attending: Family Medicine

## 2019-03-20 ENCOUNTER — Encounter (HOSPITAL_COMMUNITY): Payer: Self-pay | Admitting: *Deleted

## 2019-03-20 ENCOUNTER — Inpatient Hospital Stay (HOSPITAL_COMMUNITY): Payer: Self-pay

## 2019-03-20 DIAGNOSIS — I6389 Other cerebral infarction: Secondary | ICD-10-CM

## 2019-03-20 HISTORY — PX: TEE WITHOUT CARDIOVERSION: SHX5443

## 2019-03-20 HISTORY — PX: LOOP RECORDER INSERTION: EP1214

## 2019-03-20 HISTORY — PX: BUBBLE STUDY: SHX6837

## 2019-03-20 LAB — BASIC METABOLIC PANEL
Anion gap: 12 (ref 5–15)
Anion gap: 16 — ABNORMAL HIGH (ref 5–15)
BUN: 12 mg/dL (ref 8–23)
BUN: 13 mg/dL (ref 8–23)
CO2: 21 mmol/L — ABNORMAL LOW (ref 22–32)
CO2: 20 mmol/L — ABNORMAL LOW (ref 22–32)
Calcium: 8.8 mg/dL — ABNORMAL LOW (ref 8.9–10.3)
Calcium: 9.4 mg/dL (ref 8.9–10.3)
Chloride: 101 mmol/L (ref 98–111)
Chloride: 101 mmol/L (ref 98–111)
Creatinine, Ser: 0.85 mg/dL (ref 0.44–1.00)
Creatinine, Ser: 1.26 mg/dL — ABNORMAL HIGH (ref 0.44–1.00)
GFR calc Af Amer: >60 mL/min (ref >60)
GFR calc Af Amer: 52 mL/min — ABNORMAL LOW (ref >60)
GFR calc non Af Amer: >60 mL/min (ref >60)
GFR calc non Af Amer: 45 mL/min — ABNORMAL LOW (ref >60)
Glucose, Bld: 124 mg/dL — ABNORMAL HIGH (ref 70–99)
Glucose, Bld: 168 mg/dL — ABNORMAL HIGH (ref 70–99)
Potassium: 6 mmol/L — ABNORMAL HIGH (ref 3.5–5.1)
Potassium: 3.6 mmol/L (ref 3.5–5.1)
Sodium: 134 mmol/L — ABNORMAL LOW (ref 135–145)
Sodium: 137 mmol/L (ref 135–145)

## 2019-03-20 LAB — GLUCOSE, CAPILLARY
Glucose-Capillary: 123 mg/dL — ABNORMAL HIGH (ref 70–99)
Glucose-Capillary: 101 mg/dL — ABNORMAL HIGH (ref 70–99)
Glucose-Capillary: 159 mg/dL — ABNORMAL HIGH (ref 70–99)

## 2019-03-20 LAB — LIPID PANEL
Cholesterol: 108 mg/dL (ref 0–200)
HDL: 30 mg/dL — ABNORMAL LOW (ref >40)
LDL Cholesterol: 70 mg/dL (ref 0–99)
Total CHOL/HDL Ratio: 3.6 RATIO
Triglycerides: 41 mg/dL (ref <150)
VLDL: 8 mg/dL (ref 0–40)

## 2019-03-20 SURGERY — ECHOCARDIOGRAM, TRANSESOPHAGEAL
Anesthesia: Monitor Anesthesia Care

## 2019-03-20 SURGERY — LOOP RECORDER INSERTION

## 2019-03-20 MED ORDER — HYDRALAZINE HCL 20 MG/ML IJ SOLN
10.0000 mg | Freq: Once | INTRAMUSCULAR | Status: AC
  Administered 2019-03-20: 10 mg via INTRAVENOUS
  Filled 2019-03-20: qty 1

## 2019-03-20 MED ORDER — PROPOFOL 500 MG/50ML IV EMUL
INTRAVENOUS | Status: DC | PRN
  Administered 2019-03-20: 125 ug/kg/min via INTRAVENOUS

## 2019-03-20 MED ORDER — INSULIN ASPART 100 UNIT/ML ~~LOC~~ SOLN
0.0000 [IU] | Freq: Three times a day (TID) | SUBCUTANEOUS | Status: DC
  Administered 2019-03-21: 1 [IU] via SUBCUTANEOUS

## 2019-03-20 MED ORDER — LIDOCAINE 2% (20 MG/ML) 5 ML SYRINGE
INTRAMUSCULAR | Status: DC | PRN
  Administered 2019-03-20: 60 mg via INTRAVENOUS

## 2019-03-20 MED ORDER — PROPOFOL 10 MG/ML IV BOLUS
INTRAVENOUS | Status: DC | PRN
  Administered 2019-03-20: 10 mg via INTRAVENOUS
  Administered 2019-03-20: 20 mg via INTRAVENOUS
  Administered 2019-03-20 (×2): 10 mg via INTRAVENOUS

## 2019-03-20 MED ORDER — LIDOCAINE-EPINEPHRINE 1 %-1:100000 IJ SOLN
INTRAMUSCULAR | Status: AC
  Filled 2019-03-20: qty 1

## 2019-03-20 MED ORDER — LIDOCAINE-EPINEPHRINE 1 %-1:100000 IJ SOLN
INTRAMUSCULAR | Status: DC | PRN
  Administered 2019-03-20: 30 mL

## 2019-03-20 SURGICAL SUPPLY — 2 items
MONITOR REVEAL LINQ II (Prosthesis & Implant Heart) ×3 IMPLANT
PACK LOOP INSERTION (CUSTOM PROCEDURE TRAY) ×3 IMPLANT

## 2019-03-20 NOTE — Progress Notes (Signed)
  Echocardiogram Echocardiogram Transesophageal has been performed.  Burnett Kanaris 03/20/2019, 11:08 AM

## 2019-03-20 NOTE — Progress Notes (Signed)
PT Cancellation Note  Patient Details Name: Grabiela Wohlford MRN: [id number] DOB: [birth year]   Cancelled Treatment:    Reason Eval/Treat Not Completed: Patient at procedure or test/unavailable; patient in cath lab.  Will attempt to see as time permits.    Reginia Naas 03/20/2019, 2:54 PM Magda Kiel, [place] [phone] 03/20/2019

## 2019-03-20 NOTE — Anesthesia Postprocedure Evaluation (Signed)
Anesthesia Post Note  Patient: Denise Santiago  Procedure(s) Performed: TRANSESOPHAGEAL ECHOCARDIOGRAM (TEE) (N/A ) BUBBLE STUDY     Patient location during evaluation: PACU Anesthesia Type: MAC Level of consciousness: awake and alert and oriented Pain management: pain level controlled Vital Signs Assessment: post-procedure vital signs reviewed and stable Respiratory status: spontaneous breathing, nonlabored ventilation and respiratory function stable Cardiovascular status: blood pressure returned to baseline Postop Assessment: no apparent nausea or vomiting Anesthetic complications: no    Last Vitals:  Vitals:   03/20/19 1120 03/20/19 1135  BP: (!) 175/46 (!) 175/63  Pulse: (!) 56 (!) 59  Resp: (!) 21 16  Temp:    SpO2: 100% 97%    Last Pain:  Vitals:   03/20/19 1135  TempSrc:   PainSc: 0-No pain                 Brennan Bailey

## 2019-03-20 NOTE — Transfer of Care (Signed)
Immediate Anesthesia Transfer of Care Note  Patient: Denise Santiago  Procedure(s) Performed: TRANSESOPHAGEAL ECHOCARDIOGRAM (TEE) (N/A ) BUBBLE STUDY  Patient Location: Endoscopy Unit  Anesthesia Type:MAC  Level of Consciousness: drowsy  Airway & Oxygen Therapy: Patient Spontanous Breathing and Patient connected to nasal cannula oxygen  Post-op Assessment: Report given to RN and Post -op Vital signs reviewed and stable  Post vital signs: Reviewed  Last Vitals:  Vitals Value Taken Time  BP    Temp    Pulse    Resp    SpO2      Last Pain:  Vitals:   03/20/19 1007  TempSrc: Temporal  PainSc: 0-No pain         Complications: No apparent anesthesia complications

## 2019-03-20 NOTE — Interval H&P Note (Signed)
History and Physical Interval Note:  03/20/2019 10:42 AM  Denise Santiago  has presented today for surgery, with the diagnosis of STROKE.  The various methods of treatment have been discussed with the patient and family. After consideration of risks, benefits and other options for treatment, the patient has consented to  Procedure(s): TRANSESOPHAGEAL ECHOCARDIOGRAM (TEE) (N/A) as a surgical intervention.  The patient's history has been reviewed, patient examined, no change in status, stable for surgery.  I have reviewed the patient's chart and labs.  Questions were answered to the patient's satisfaction.     Jenkins Rouge
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
## 2019-03-20 NOTE — Anesthesia Preprocedure Evaluation (Addendum)
Anesthesia Evaluation  Patient identified by MRN, date of birth, ID band Patient awake    Reviewed: Allergy & Precautions, NPO status , Patient's Chart, lab work & pertinent test results  History of Anesthesia Complications Negative for: history of anesthetic complications  Airway Mallampati: II  TM Distance: >3 FB Neck ROM: Full    Dental  (+) Edentulous Upper, Missing,    Pulmonary Current Smoker,    Pulmonary exam normal        Cardiovascular hypertension, Pt. on medications Normal cardiovascular exam     Neuro/Psych CVA (12/2018) negative psych ROS   GI/Hepatic negative GI ROS, Neg liver ROS,   Endo/Other  diabetes, Type 2, Oral Hypoglycemic AgentsMorbid obesity  Renal/GU negative Renal ROS  negative genitourinary   Musculoskeletal negative musculoskeletal ROS (+)   Abdominal   Peds  Hematology negative hematology ROS (+)   Anesthesia Other Findings Day of surgery medications reviewed with patient.  Reproductive/Obstetrics negative OB ROS                            Anesthesia Physical Anesthesia Plan  ASA: III  Anesthesia Plan: MAC   Post-op Pain Management:    Induction:   PONV Risk Score and Plan: Treatment may vary due to age or medical condition and Propofol infusion  Airway Management Planned: Natural Airway and Nasal Cannula  Additional Equipment: None  Intra-op Plan:   Post-operative Plan:   Informed Consent: I have reviewed the patients History and Physical, chart, labs and discussed the procedure including the risks, benefits and alternatives for the proposed anesthesia with the patient or authorized representative who has indicated his/her understanding and acceptance.     Dental advisory given  Plan Discussed with: CRNA  Anesthesia Plan Comments:         Anesthesia Quick Evaluation

## 2019-03-20 NOTE — Progress Notes (Signed)
Physician made aware potassium 6.0.
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
## 2019-03-20 NOTE — Plan of Care (Signed)
Plan of care adequate for discharge.

## 2019-03-20 NOTE — Anesthesia Procedure Notes (Signed)
Procedure Name: MAC Date/Time: 03/20/2019 10:54 AM Performed by: Janene Harvey, CRNA Pre-anesthesia Checklist: Patient identified, Emergency Drugs available, Suction available and Patient being monitored Patient Re-evaluated:Patient Re-evaluated prior to induction Oxygen Delivery Method: Nasal cannula Dental Injury: Teeth and Oropharynx as per pre-operative assessment
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
## 2019-03-20 NOTE — Plan of Care (Signed)
Patient progressing towards plan of care goals. 

## 2019-03-20 NOTE — Progress Notes (Signed)
OT Screen  Note  Patient Details Name: Denise Santiago MRN: [id number] DOB: [birth year]   Cancelled Treatment:    Reason Eval/Treat Not Completed: OT screened, no needs identified, will sign off PT Cyndi present and finishing PT evaluation without needs. No acute OT needs at this time. Ot to sign off   Jeri Modena 03/20/2019, 3:44 PM
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
## 2019-03-20 NOTE — Progress Notes (Signed)
Physician made aware potassium 6.0. 

## 2019-03-21 LAB — GLUCOSE, CAPILLARY
Glucose-Capillary: 110 mg/dL — ABNORMAL HIGH (ref 70–99)
Glucose-Capillary: 123 mg/dL — ABNORMAL HIGH (ref 70–99)

## 2019-03-21 MED ORDER — ACETAMINOPHEN 325 MG PO TABS: 650.0000 mg | ORAL_TABLET | Freq: Four times a day (QID) | ORAL | 0 refills | Status: AC | PRN

## 2019-03-21 MED ORDER — STUDY - INVESTIGATIONAL MEDICATION: 99 refills | Status: DC

## 2019-03-21 MED ORDER — ATORVASTATIN CALCIUM 80 MG PO TABS: 80.0000 mg | ORAL_TABLET | Freq: Every day | ORAL | 11 refills | Status: AC

## 2019-03-21 MED ORDER — METFORMIN HCL 850 MG PO TABS: 850.0000 mg | ORAL_TABLET | Freq: Two times a day (BID) | ORAL | 0 refills | Status: AC

## 2019-03-21 MED ORDER — ALBUTEROL SULFATE HFA 108 (90 BASE) MCG/ACT IN AERS: 2.0000 | INHALATION_SPRAY | RESPIRATORY_TRACT | 0 refills | Status: DC | PRN

## 2019-03-21 MED ORDER — LISINOPRIL-HYDROCHLOROTHIAZIDE 20-12.5 MG PO TABS: 1.0000 | ORAL_TABLET | Freq: Every day | ORAL | 5 refills | Status: DC

## 2019-03-21 NOTE — Progress Notes (Signed)
AVS was reviewed with patient and patient was given a copy. Study meds were also obtained by nurse from pharmacy and given to patient. All lines removed, patient dressed, belongings packed, and patient taken by nurse via wheelchair to sister's car for discharge.

## 2019-03-21 NOTE — Discharge Instructions (Signed)
Implant site wound check care instructions Keep incision clean and dry for 3 days. You can remove outer dressing tomorrow. Leave steri-strips (little pieces of tape) on until seen in the office for wound check appointment. Call the office [phone]) for redness, drainage, swelling, or fever.   1)BMS Axiomatic  stroke prevention trial--- take your trial/study medications as prescribed by neurologist Dr. Leonie Man 2) follow-up as outpatient with Dr. Leonie Man as scheduled 3)You are taking aspirin and Plavix which are blood thinners so Avoid ibuprofen/Advil/Aleve/Motrin/Goody Powders/Naproxen/BC powders/Meloxicam/Diclofenac/Indomethacin and other Nonsteroidal anti-inflammatory medications as these will make you more likely to bleed and can cause stomach ulcers, can also cause Kidney problems.

## 2019-03-21 NOTE — Discharge Summary (Signed)
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 Use Authorization (EUA). This EUA will remain  in effect (meaning this test can be used) for the duration of the COVID-19 declaration under Section 56 4(b)(1) of the Act, 21 U.S.C. section 360bbb-3(b)(1), unless the authorization is terminated or revoked sooner. Performed at [facility], [address] [place], [place] [address]        Today   Subjective    Othel Dicostanzo today has no new complaints -Left-sided weakness appears to be resolving          Patient has been seen and examined prior to discharge   Objective   Blood pressure (!) 159/61, pulse (!) 55, temperature 99 F (37.2 C), temperature source Oral, resp. rate 20, height  (1.575 m), weight 99.8 kg, SpO2 100 %.   Intake/Output Summary (Last 24 hours) at 03/21/2019 1412 Last data filed at 03/21/2019 1346 Gross per 24 hour  Intake 180 ml  Output --  Net 180 ml   Exam Gen:- Awake Alert,   Eye-left eye vision loss HEENT:- [place].AT, No  sclera icterus Neck-Supple Neck,No JVD,.  Lungs-  CTAB , fair symmetrical air movement CV- S1, S2 normal, regular  Abd-  +ve B.Sounds, Abd Soft, No tenderness,    Extremity/Skin:- No  edema, pedal pulses present  Psych-affect is appropriate, oriented x3 Neuro-- subtle left-sided hemiparesis   Data Review   CBC w Diff:  Lab Results  Component Value Date   WBC 5.3 03/18/2019   HGB 13.9 03/18/2019   HCT 40.5 03/18/2019   PLT 142 (L) 03/18/2019   LYMPHOPCT 28 03/18/2019   MONOPCT 8 03/18/2019   EOSPCT 0 03/18/2019   BASOPCT 1 03/18/2019    CMP:  Lab Results  Component Value Date   NA 137 03/20/2019   K 3.6 03/20/2019   CL 101 03/20/2019   CO2 20 (L) 03/20/2019   BUN 13 03/20/2019   CREATININE 1.26 (H) 03/20/2019   PROT 7.2 03/18/2019   ALBUMIN 3.4 (L) 03/18/2019   BILITOT 0.7 03/18/2019   ALKPHOS 76 03/18/2019   AST 18 03/18/2019   ALT 16 03/18/2019  .   Total Discharge time is about 33 minutes  Shon Hale M.D on 03/21/2019 at 2:12 PM  Go to [email or web address] -  for contact info  [facility] - Office  [phone]

## 2019-03-23 ENCOUNTER — Encounter (HOSPITAL_COMMUNITY): Payer: Self-pay | Admitting: Cardiology

## 2019-03-30 ENCOUNTER — Encounter: Payer: Self-pay | Admitting: *Deleted

## 2019-03-30 ENCOUNTER — Other Ambulatory Visit: Payer: Self-pay | Admitting: *Deleted
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
## 2019-04-01 ENCOUNTER — Telehealth: Payer: Self-pay | Admitting: Cardiology

## 2019-04-01 ENCOUNTER — Ambulatory Visit (INDEPENDENT_AMBULATORY_CARE_PROVIDER_SITE_OTHER): Payer: Self-pay | Admitting: Student

## 2019-04-01 ENCOUNTER — Other Ambulatory Visit: Payer: Self-pay

## 2019-04-01 DIAGNOSIS — I6310 Cerebral infarction due to embolism of unspecified precerebral artery: Secondary | ICD-10-CM

## 2019-04-01 LAB — CUP PACEART INCLINIC DEVICE CHECK
Date Time Interrogation Session: 20201125124040
Implantable Pulse Generator Implant Date: 20201113

## 2019-04-01 NOTE — Telephone Encounter (Signed)
Called patient back. Informed patient that we are not sure who called her and what the call was for. Encouraged patient to keep her appointment with Oda Kilts PA today, and if there is anything that needs to be address, he will be able to address at her appointment. Patient verbalized understanding.

## 2019-04-01 NOTE — Progress Notes (Signed)
ILR wound check in clinic. Steri strips removed. Wound well healed. Home monitor transmitting nightly. No episodes. Questions answered. r waves  0.20 -0.35 mV

## 2019-04-01 NOTE — Telephone Encounter (Signed)
New Message  Patient is calling in stating that she missed a call from [place]. Patient is returning call, no notes to indicate who was calling and what for. If called, please give patient a call back.

## 2019-04-07 ENCOUNTER — Other Ambulatory Visit: Payer: Self-pay | Admitting: *Deleted

## 2019-04-07 ENCOUNTER — Encounter: Payer: Self-pay | Admitting: *Deleted
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
## 2019-04-27 ENCOUNTER — Ambulatory Visit (INDEPENDENT_AMBULATORY_CARE_PROVIDER_SITE_OTHER): Payer: Self-pay | Admitting: *Deleted

## 2019-04-27 DIAGNOSIS — I63511 Cerebral infarction due to unspecified occlusion or stenosis of right middle cerebral artery: Secondary | ICD-10-CM

## 2019-04-27 LAB — CUP PACEART REMOTE DEVICE CHECK
Date Time Interrogation Session: 20201220195740
Implantable Pulse Generator Implant Date: 20201113

## 2019-05-29 ENCOUNTER — Ambulatory Visit (INDEPENDENT_AMBULATORY_CARE_PROVIDER_SITE_OTHER): Payer: Self-pay | Admitting: *Deleted

## 2019-05-29 DIAGNOSIS — I6310 Cerebral infarction due to embolism of unspecified precerebral artery: Secondary | ICD-10-CM

## 2019-05-30 LAB — CUP PACEART REMOTE DEVICE CHECK
Date Time Interrogation Session: 20210122195757
Implantable Pulse Generator Implant Date: 20201113

## 2019-06-04 ENCOUNTER — Other Ambulatory Visit (HOSPITAL_COMMUNITY): Payer: Self-pay | Admitting: Neurology

## 2019-06-04 DIAGNOSIS — I6300 Cerebral infarction due to thrombosis of unspecified precerebral artery: Secondary | ICD-10-CM
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
## 2019-06-12 ENCOUNTER — Encounter: Payer: Self-pay | Admitting: Neurology

## 2019-06-12 ENCOUNTER — Telehealth (INDEPENDENT_AMBULATORY_CARE_PROVIDER_SITE_OTHER): Payer: Self-pay | Admitting: Neurology

## 2019-06-12 ENCOUNTER — Other Ambulatory Visit: Payer: Self-pay

## 2019-06-12 DIAGNOSIS — E1169 Type 2 diabetes mellitus with other specified complication: Secondary | ICD-10-CM

## 2019-06-12 DIAGNOSIS — Z720 Tobacco use: Secondary | ICD-10-CM

## 2019-06-12 DIAGNOSIS — I10 Essential (primary) hypertension: Secondary | ICD-10-CM

## 2019-06-12 DIAGNOSIS — I63411 Cerebral infarction due to embolism of right middle cerebral artery: Secondary | ICD-10-CM

## 2019-06-12 DIAGNOSIS — E785 Hyperlipidemia, unspecified: Secondary | ICD-10-CM

## 2019-06-14 ENCOUNTER — Ambulatory Visit (HOSPITAL_COMMUNITY)
Admission: RE | Admit: 2019-06-14 | Discharge: 2019-06-14 | Disposition: A | Discharge status: Home / Self Care | Payer: Self-pay | Source: Ambulatory Visit | Attending: Neurology | Admitting: Neurology

## 2019-06-14 ENCOUNTER — Other Ambulatory Visit: Payer: Self-pay

## 2019-06-14 DIAGNOSIS — I6300 Cerebral infarction due to thrombosis of unspecified precerebral artery: Secondary | ICD-10-CM | POA: Insufficient documentation

## 2019-06-15 NOTE — Progress Notes (Signed)
Kindly inform the patient that the BMS study specific 90-day MRI scan of the brain shows expected evolutionary changes in his recent stroke.  No new or worrisome findings

## 2019-06-17 ENCOUNTER — Other Ambulatory Visit (HOSPITAL_COMMUNITY): Payer: Self-pay | Admitting: Neurology

## 2019-06-17 NOTE — Progress Notes (Signed)
BMS AXIOMATIC STROKE STUDY END OF STUDY VISIT  Patient returns today for a 90-day end of study visit.  She states she is doing well.  She has had no recurrent stroke or TIA symptoms.  She made full recovery from stroke.  She returns to study medications bottles. NIH stroke scale is 0 Modified Rankin score is 0 Neurological exam shows only mild diminished fine motor skills on the left with orbiting right over left upper extremity.  Patient was advised she start taking Plavix 75 mg daily for stroke prevention and was given a prescription.  She was advised to maintain strict control of her hypertension with blood pressure goal below 130/90, lipids with LDL cholesterol goal below 70 mg percent and diabetes with hemoglobin A1c goal below 6.5%.  She was advised to make an appointment to follow-up in the stroke clinic in 6 months or call earlier if necessary.  She voiced understanding  Delia Heady, MD

## 2019-06-29 ENCOUNTER — Ambulatory Visit (INDEPENDENT_AMBULATORY_CARE_PROVIDER_SITE_OTHER): Payer: Self-pay | Admitting: *Deleted

## 2019-06-29 DIAGNOSIS — I6310 Cerebral infarction due to embolism of unspecified precerebral artery: Secondary | ICD-10-CM

## 2019-06-30 LAB — CUP PACEART REMOTE DEVICE CHECK
Date Time Interrogation Session: 20210222195931
Implantable Pulse Generator Implant Date: 20201113

## 2019-06-30 NOTE — Progress Notes (Signed)
ILR Remote 

## 2019-07-15 ENCOUNTER — Telehealth: Payer: Self-pay | Admitting: Pharmacy Technician

## 2019-07-15 NOTE — Telephone Encounter (Signed)
[facility] provided patient an initial 30 day supply of medication.  Patient failed to provide requested proof of income.  [facility] unable to provide additional medication assistance until patient provides requested financial documentation.  Paiden Caraveo J. Taylee Gunnells Care Manager Medication Management Clinic 

## 2019-07-18 ENCOUNTER — Ambulatory Visit: Payer: No Typology Code available for payment source | Attending: Internal Medicine

## 2019-07-18 DIAGNOSIS — Z23 Encounter for immunization: Secondary | ICD-10-CM

## 2019-07-18 NOTE — Progress Notes (Signed)
   Covid-19 Vaccination Clinic  Name:  Denise Santiago    MRN: [id number] DOB: 01/11/1956  07/18/2019  Denise Santiago was observed post Covid-19 immunization for 15 minutes without incident. She was provided with Vaccine Information Sheet and instruction to access the V-Safe system.   Denise Santiago was instructed to call 911 with any severe reactions post vaccine: Marland Kitchen Difficulty breathing  . Swelling of face and throat  . A fast heartbeat  . A bad rash all over body  . Dizziness and weakness   Immunizations Administered    Name Date Dose VIS Date Route   Pfizer COVID-19 Vaccine 07/18/2019  1:53 PM 0.3 mL 04/17/2019 Intramuscular   Manufacturer: [employer], [employer]   Lot: [id number]   NDC: [address]-8

## 2019-07-26 ENCOUNTER — Encounter (HOSPITAL_COMMUNITY): Payer: Self-pay | Admitting: Emergency Medicine

## 2019-07-26 ENCOUNTER — Inpatient Hospital Stay (HOSPITAL_COMMUNITY)
Admission: EM | Admit: 2019-07-26 | Discharge: 2019-09-04 | DRG: 094 | Disposition: A | Discharge status: Skilled Nursing Facility | Payer: Self-pay | Attending: Internal Medicine | Admitting: Internal Medicine

## 2019-07-26 ENCOUNTER — Emergency Department (HOSPITAL_COMMUNITY): Payer: Self-pay

## 2019-07-26 ENCOUNTER — Other Ambulatory Visit: Payer: Self-pay

## 2019-07-26 DIAGNOSIS — R531 Weakness: Secondary | ICD-10-CM

## 2019-07-26 DIAGNOSIS — Z20822 Contact with and (suspected) exposure to covid-19: Secondary | ICD-10-CM | POA: Diagnosis present

## 2019-07-26 DIAGNOSIS — R5381 Other malaise: Secondary | ICD-10-CM

## 2019-07-26 DIAGNOSIS — B370 Candidal stomatitis: Secondary | ICD-10-CM | POA: Diagnosis not present

## 2019-07-26 DIAGNOSIS — G9341 Metabolic encephalopathy: Secondary | ICD-10-CM | POA: Diagnosis not present

## 2019-07-26 DIAGNOSIS — F329 Major depressive disorder, single episode, unspecified: Secondary | ICD-10-CM

## 2019-07-26 DIAGNOSIS — F323 Major depressive disorder, single episode, severe with psychotic features: Secondary | ICD-10-CM | POA: Diagnosis present

## 2019-07-26 DIAGNOSIS — F17210 Nicotine dependence, cigarettes, uncomplicated: Secondary | ICD-10-CM | POA: Diagnosis present

## 2019-07-26 DIAGNOSIS — N182 Chronic kidney disease, stage 2 (mild): Secondary | ICD-10-CM | POA: Diagnosis present

## 2019-07-26 DIAGNOSIS — I82442 Acute embolism and thrombosis of left tibial vein: Secondary | ICD-10-CM | POA: Diagnosis present

## 2019-07-26 DIAGNOSIS — R1310 Dysphagia, unspecified: Secondary | ICD-10-CM

## 2019-07-26 DIAGNOSIS — I639 Cerebral infarction, unspecified: Secondary | ICD-10-CM | POA: Diagnosis present

## 2019-07-26 DIAGNOSIS — R627 Adult failure to thrive: Secondary | ICD-10-CM | POA: Diagnosis present

## 2019-07-26 DIAGNOSIS — R778 Other specified abnormalities of plasma proteins: Secondary | ICD-10-CM | POA: Diagnosis present

## 2019-07-26 DIAGNOSIS — N189 Chronic kidney disease, unspecified: Secondary | ICD-10-CM | POA: Diagnosis present

## 2019-07-26 DIAGNOSIS — K0889 Other specified disorders of teeth and supporting structures: Secondary | ICD-10-CM

## 2019-07-26 DIAGNOSIS — Z6835 Body mass index (BMI) 35.0-35.9, adult: Secondary | ICD-10-CM

## 2019-07-26 DIAGNOSIS — R7989 Other specified abnormal findings of blood chemistry: Secondary | ICD-10-CM

## 2019-07-26 DIAGNOSIS — D696 Thrombocytopenia, unspecified: Secondary | ICD-10-CM | POA: Diagnosis present

## 2019-07-26 DIAGNOSIS — E876 Hypokalemia: Secondary | ICD-10-CM

## 2019-07-26 DIAGNOSIS — K130 Diseases of lips: Secondary | ICD-10-CM

## 2019-07-26 DIAGNOSIS — E669 Obesity, unspecified: Secondary | ICD-10-CM | POA: Diagnosis present

## 2019-07-26 DIAGNOSIS — K056 Periodontal disease, unspecified: Secondary | ICD-10-CM | POA: Diagnosis present

## 2019-07-26 DIAGNOSIS — E871 Hypo-osmolality and hyponatremia: Secondary | ICD-10-CM | POA: Diagnosis present

## 2019-07-26 DIAGNOSIS — K117 Disturbances of salivary secretion: Secondary | ICD-10-CM | POA: Diagnosis present

## 2019-07-26 DIAGNOSIS — Z7189 Other specified counseling: Secondary | ICD-10-CM

## 2019-07-26 DIAGNOSIS — G610 Guillain-Barre syndrome: Principal | ICD-10-CM | POA: Diagnosis present

## 2019-07-26 DIAGNOSIS — Z6829 Body mass index (BMI) 29.0-29.9, adult: Secondary | ICD-10-CM

## 2019-07-26 DIAGNOSIS — K529 Noninfective gastroenteritis and colitis, unspecified: Secondary | ICD-10-CM | POA: Diagnosis not present

## 2019-07-26 DIAGNOSIS — E1122 Type 2 diabetes mellitus with diabetic chronic kidney disease: Secondary | ICD-10-CM | POA: Diagnosis present

## 2019-07-26 DIAGNOSIS — Z7902 Long term (current) use of antithrombotics/antiplatelets: Secondary | ICD-10-CM

## 2019-07-26 DIAGNOSIS — I82409 Acute embolism and thrombosis of unspecified deep veins of unspecified lower extremity: Secondary | ICD-10-CM

## 2019-07-26 DIAGNOSIS — Z882 Allergy status to sulfonamides status: Secondary | ICD-10-CM

## 2019-07-26 DIAGNOSIS — Z7984 Long term (current) use of oral hypoglycemic drugs: Secondary | ICD-10-CM

## 2019-07-26 DIAGNOSIS — B3781 Candidal esophagitis: Secondary | ICD-10-CM | POA: Diagnosis not present

## 2019-07-26 DIAGNOSIS — I129 Hypertensive chronic kidney disease with stage 1 through stage 4 chronic kidney disease, or unspecified chronic kidney disease: Secondary | ICD-10-CM | POA: Diagnosis present

## 2019-07-26 DIAGNOSIS — I2699 Other pulmonary embolism without acute cor pulmonale: Secondary | ICD-10-CM | POA: Diagnosis not present

## 2019-07-26 DIAGNOSIS — K5900 Constipation, unspecified: Secondary | ICD-10-CM

## 2019-07-26 DIAGNOSIS — N179 Acute kidney failure, unspecified: Secondary | ICD-10-CM | POA: Diagnosis present

## 2019-07-26 DIAGNOSIS — I69354 Hemiplegia and hemiparesis following cerebral infarction affecting left non-dominant side: Secondary | ICD-10-CM

## 2019-07-26 DIAGNOSIS — E538 Deficiency of other specified B group vitamins: Secondary | ICD-10-CM | POA: Diagnosis not present

## 2019-07-26 DIAGNOSIS — N390 Urinary tract infection, site not specified: Secondary | ICD-10-CM | POA: Diagnosis present

## 2019-07-26 DIAGNOSIS — Z66 Do not resuscitate: Secondary | ICD-10-CM | POA: Diagnosis present

## 2019-07-26 DIAGNOSIS — I10 Essential (primary) hypertension: Secondary | ICD-10-CM | POA: Diagnosis present

## 2019-07-26 DIAGNOSIS — I69392 Facial weakness following cerebral infarction: Secondary | ICD-10-CM

## 2019-07-26 DIAGNOSIS — Z515 Encounter for palliative care: Secondary | ICD-10-CM

## 2019-07-26 DIAGNOSIS — E785 Hyperlipidemia, unspecified: Secondary | ICD-10-CM | POA: Diagnosis present

## 2019-07-26 DIAGNOSIS — B9620 Unspecified Escherichia coli [E. coli] as the cause of diseases classified elsewhere: Secondary | ICD-10-CM | POA: Diagnosis present

## 2019-07-26 DIAGNOSIS — E872 Acidosis, unspecified: Secondary | ICD-10-CM | POA: Diagnosis present

## 2019-07-26 DIAGNOSIS — E860 Dehydration: Secondary | ICD-10-CM | POA: Diagnosis present

## 2019-07-26 DIAGNOSIS — E119 Type 2 diabetes mellitus without complications: Secondary | ICD-10-CM

## 2019-07-26 DIAGNOSIS — E440 Moderate protein-calorie malnutrition: Secondary | ICD-10-CM | POA: Diagnosis present

## 2019-07-26 LAB — BASIC METABOLIC PANEL
Anion gap: 13 (ref 5–15)
BUN: 28 mg/dL — ABNORMAL HIGH (ref 8–23)
CO2: 27 mmol/L (ref 22–32)
Calcium: 9.5 mg/dL (ref 8.9–10.3)
Chloride: 94 mmol/L — ABNORMAL LOW (ref 98–111)
Creatinine, Ser: 1.38 mg/dL — ABNORMAL HIGH (ref 0.44–1.00)
GFR calc Af Amer: 47 mL/min — ABNORMAL LOW (ref >60)
GFR calc non Af Amer: 40 mL/min — ABNORMAL LOW (ref >60)
Glucose, Bld: 136 mg/dL — ABNORMAL HIGH (ref 70–99)
Potassium: 2.9 mmol/L — ABNORMAL LOW (ref 3.5–5.1)
Sodium: 134 mmol/L — ABNORMAL LOW (ref 135–145)

## 2019-07-26 LAB — GLUCOSE, CAPILLARY
Glucose-Capillary: 143 mg/dL — ABNORMAL HIGH (ref 70–99)
Glucose-Capillary: 91 mg/dL (ref 70–99)

## 2019-07-26 LAB — CBC
HCT: 44.3 % (ref 36.0–46.0)
HCT: 46.7 % — ABNORMAL HIGH (ref 36.0–46.0)
Hemoglobin: 15 g/dL (ref 12.0–15.0)
Hemoglobin: 16.2 g/dL — ABNORMAL HIGH (ref 12.0–15.0)
MCH: 30.1 pg (ref 26.0–34.0)
MCH: 30.7 pg (ref 26.0–34.0)
MCHC: 33.9 g/dL (ref 30.0–36.0)
MCHC: 34.7 g/dL (ref 30.0–36.0)
MCV: 88.8 fL (ref 80.0–100.0)
MCV: 88.4 fL (ref 80.0–100.0)
Platelets: 101 10*3/uL — ABNORMAL LOW (ref 150–400)
Platelets: 88 10*3/uL — ABNORMAL LOW (ref 150–400)
RBC: 4.99 MIL/uL (ref 3.87–5.11)
RBC: 5.28 MIL/uL — ABNORMAL HIGH (ref 3.87–5.11)
RDW: 12.6 % (ref 11.5–15.5)
RDW: 12.6 % (ref 11.5–15.5)
WBC: 4.7 10*3/uL (ref 4.0–10.5)
WBC: 5.4 10*3/uL (ref 4.0–10.5)
nRBC: 0 % (ref 0.0–0.2)
nRBC: 0 % (ref 0.0–0.2)

## 2019-07-26 LAB — HEMOGLOBIN A1C
Hgb A1c MFr Bld: 6.9 % — ABNORMAL HIGH (ref 4.8–5.6)
Mean Plasma Glucose: 151.33 mg/dL

## 2019-07-26 LAB — CREATININE, SERUM
Creatinine, Ser: 1.04 mg/dL — ABNORMAL HIGH (ref 0.44–1.00)
GFR calc Af Amer: >60 mL/min (ref >60)
GFR calc non Af Amer: 57 mL/min — ABNORMAL LOW (ref >60)

## 2019-07-26 LAB — LIPASE, BLOOD: Lipase: 24 U/L (ref 11–51)

## 2019-07-26 LAB — CBG MONITORING, ED: Glucose-Capillary: 136 mg/dL — ABNORMAL HIGH (ref 70–99)

## 2019-07-26 LAB — LACTIC ACID, PLASMA
Lactic Acid, Venous: 3 mmol/L (ref 0.5–1.9)
Lactic Acid, Venous: 1.8 mmol/L (ref 0.5–1.9)
Lactic Acid, Venous: 1.8 mmol/L (ref 0.5–1.9)

## 2019-07-26 LAB — BRAIN NATRIURETIC PEPTIDE: B Natriuretic Peptide: 64.2 pg/mL (ref 0.0–100.0)

## 2019-07-26 LAB — HEPATIC FUNCTION PANEL
ALT: 27 U/L (ref 0–44)
AST: 40 U/L (ref 15–41)
Albumin: 3.5 g/dL (ref 3.5–5.0)
Alkaline Phosphatase: 64 U/L (ref 38–126)
Bilirubin, Direct: 0.3 mg/dL — ABNORMAL HIGH (ref 0.0–0.2)
Indirect Bilirubin: 1.1 mg/dL — ABNORMAL HIGH (ref 0.3–0.9)
Total Bilirubin: 1.4 mg/dL — ABNORMAL HIGH (ref 0.3–1.2)
Total Protein: 7.7 g/dL (ref 6.5–8.1)

## 2019-07-26 LAB — CK TOTAL AND CKMB (NOT AT ARMC)
CK, MB: 22.3 ng/mL — ABNORMAL HIGH (ref 0.5–5.0)
Relative Index: 5.2 — ABNORMAL HIGH (ref 0.0–2.5)
Total CK: 429 U/L — ABNORMAL HIGH (ref 38–234)

## 2019-07-26 LAB — SARS CORONAVIRUS 2 (TAT 6-24 HRS): SARS Coronavirus 2: NEGATIVE

## 2019-07-26 LAB — TROPONIN I (HIGH SENSITIVITY)
Troponin I (High Sensitivity): 66 ng/L — ABNORMAL HIGH (ref <18)
Troponin I (High Sensitivity): 76 ng/L — ABNORMAL HIGH (ref <18)

## 2019-07-26 LAB — MAGNESIUM: Magnesium: 2.7 mg/dL — ABNORMAL HIGH (ref 1.7–2.4)

## 2019-07-26 MED ORDER — CITALOPRAM HYDROBROMIDE 10 MG PO TABS
10.0000 mg | ORAL_TABLET | Freq: Every day | ORAL | Status: DC
  Administered 2019-07-26 – 2019-07-28 (×3): 10 mg via ORAL
  Filled 2019-07-26 (×3): qty 1

## 2019-07-26 MED ORDER — INSULIN ASPART 100 UNIT/ML ~~LOC~~ SOLN
0.0000 [IU] | Freq: Three times a day (TID) | SUBCUTANEOUS | Status: DC
  Administered 2019-07-26 – 2019-07-27 (×2): 2 [IU] via SUBCUTANEOUS
  Administered 2019-07-28: 3 [IU] via SUBCUTANEOUS
  Administered 2019-07-28 – 2019-08-24 (×19): 2 [IU] via SUBCUTANEOUS
  Administered 2019-08-27: 3 [IU] via SUBCUTANEOUS
  Administered 2019-08-30 – 2019-09-01 (×4): 2 [IU] via SUBCUTANEOUS
  Administered 2019-09-01 (×2): 3 [IU] via SUBCUTANEOUS
  Administered 2019-09-02: 2 [IU] via SUBCUTANEOUS
  Administered 2019-09-02 (×2): 3 [IU] via SUBCUTANEOUS
  Administered 2019-09-03: 2 [IU] via SUBCUTANEOUS
  Administered 2019-09-03: 3 [IU] via SUBCUTANEOUS
  Administered 2019-09-03: 5 [IU] via SUBCUTANEOUS
  Administered 2019-09-04 (×2): 3 [IU] via SUBCUTANEOUS
  Administered 2019-09-04: 2 [IU] via SUBCUTANEOUS

## 2019-07-26 MED ORDER — SODIUM CHLORIDE 0.9 % IV SOLN: INTRAVENOUS | Status: DC

## 2019-07-26 MED ORDER — POTASSIUM CHLORIDE CRYS ER 20 MEQ PO TBCR
40.0000 meq | EXTENDED_RELEASE_TABLET | Freq: Once | ORAL | Status: AC
  Administered 2019-07-26: 40 meq via ORAL
  Filled 2019-07-26: qty 2

## 2019-07-26 MED ORDER — SODIUM CHLORIDE 0.9 % IV BOLUS
500.0000 mL | Freq: Once | INTRAVENOUS | Status: AC
  Administered 2019-07-26: 500 mL via INTRAVENOUS

## 2019-07-26 MED ORDER — ENOXAPARIN SODIUM 40 MG/0.4ML ~~LOC~~ SOLN
40.0000 mg | SUBCUTANEOUS | Status: DC
  Administered 2019-07-26: 40 mg via SUBCUTANEOUS
  Filled 2019-07-26: qty 0.4

## 2019-07-26 MED ORDER — ACETAMINOPHEN 650 MG RE SUPP: 650.0000 mg | Freq: Four times a day (QID) | RECTAL | Status: DC | PRN

## 2019-07-26 MED ORDER — ONDANSETRON HCL 4 MG/2ML IJ SOLN
4.0000 mg | Freq: Four times a day (QID) | INTRAMUSCULAR | Status: DC | PRN
  Administered 2019-07-29 – 2019-08-02 (×4): 4 mg via INTRAVENOUS
  Filled 2019-07-26 (×4): qty 2

## 2019-07-26 MED ORDER — ATORVASTATIN CALCIUM 80 MG PO TABS
80.0000 mg | ORAL_TABLET | Freq: Every day | ORAL | Status: DC
  Administered 2019-07-26 – 2019-08-07 (×12): 80 mg via ORAL
  Filled 2019-07-26 (×13): qty 1

## 2019-07-26 MED ORDER — CLOPIDOGREL BISULFATE 75 MG PO TABS
75.0000 mg | ORAL_TABLET | Freq: Every day | ORAL | Status: DC
  Administered 2019-07-26 – 2019-08-22 (×27): 75 mg via ORAL
  Filled 2019-07-26 (×28): qty 1

## 2019-07-26 MED ORDER — LATANOPROST 0.005 % OP SOLN
1.0000 [drp] | Freq: Every day | OPHTHALMIC | Status: DC
  Administered 2019-07-26 – 2019-09-03 (×40): 1 [drp] via OPHTHALMIC
  Filled 2019-07-26 (×2): qty 2.5

## 2019-07-26 MED ORDER — ONDANSETRON HCL 4 MG PO TABS: 4.0000 mg | ORAL_TABLET | Freq: Four times a day (QID) | ORAL | Status: DC | PRN

## 2019-07-26 MED ORDER — POTASSIUM CHLORIDE IN NACL 40-0.9 MEQ/L-% IV SOLN
INTRAVENOUS | Status: DC
  Administered 2019-07-26 – 2019-07-27 (×2): 75 mL/h via INTRAVENOUS
  Administered 2019-07-28 – 2019-07-31 (×4): 50 mL/h via INTRAVENOUS
  Filled 2019-07-26 (×7): qty 1000

## 2019-07-26 MED ORDER — ACETAMINOPHEN 325 MG PO TABS
650.0000 mg | ORAL_TABLET | Freq: Four times a day (QID) | ORAL | Status: DC | PRN
  Administered 2019-07-27 – 2019-08-22 (×4): 650 mg via ORAL
  Filled 2019-07-26 (×4): qty 2

## 2019-07-26 MED ORDER — INSULIN ASPART 100 UNIT/ML ~~LOC~~ SOLN
0.0000 [IU] | Freq: Every day | SUBCUTANEOUS | Status: DC
  Administered 2019-07-27: 3 [IU] via SUBCUTANEOUS

## 2019-07-26 MED ORDER — SODIUM CHLORIDE 0.9% FLUSH: 3.0000 mL | Freq: Once | INTRAVENOUS | Status: DC

## 2019-07-26 NOTE — ED Triage Notes (Signed)
Pt to triage via [facility] from home.  EMS called out for fall.  Pt denies injury from fall.  Reports generalized weakness x 1 week.  Pt usually works but over the past week has not been taking medications and has been unable to ambulate with walker family got her.  Pt is supposed to be taking blood thinner but hasn't had medication in approx 2 weeks.
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
## 2019-07-26 NOTE — ED Provider Notes (Signed)
[facility] EMERGENCY DEPARTMENT Provider Note   CSN: [id number] Arrival date & time: 07/26/19  0741     History Chief Complaint  Patient presents with  . Weakness    Denise Santiago is a 64 y.o. female.  Patient brought in by EMS from home.  Family called for a fall.  Patient states she has had several falls.  Patient denies any injury from the fall.  No complaints.  She just been weak.  She had her first Covid vaccine beginning of the week and then since that time she has had generalized weakness.  Last couple days started using a walker never needed a walker before.  Patient supposed to be on Plavix.  For prior strokes.  Family not sure if she is taking that.  Patient denies any chest pain any shortness of breath headache any fever or any dysuria.  On initial exam patient is moving all extremities pretty well including her left side which is post to have a stroke deficit.  Patient states that really most of that is recovered and she normally gets around fine.  She just has felt generally weak and fatigued.        Past Medical History:  Diagnosis Date  . CVA (cerebral vascular accident) ([place]) 12/2018   And 03/2019, loop recorder inserted 03/2019  . Diabetes mellitus without complication ([place])   . Hyperlipidemia   . Hypertension   . Obesity     Patient Active Problem List   Diagnosis Date Noted  . Hyperlipidemia   . Generalized weakness   . Hypokalemia   . Hyponatremia   . Acute on chronic kidney failure ([place])   . Thrombocytopenia ([place])   . Elevated troponin   . Lactic acidosis   . Essential hypertension 03/19/2019  . Type 2 diabetes mellitus without complication ([place]) 27/07/5007  . Left hemiparesis ([place]) 03/19/2019  . CVA (cerebral vascular accident) ([place]) 03/19/2019  . Right hemiparesis ([place]) 03/19/2019  . Acute ischemic right MCA stroke ([place]) 12/10/2018    Past Surgical History:  Procedure Laterality Date  . ABDOMINAL HYSTERECTOMY    .  BUBBLE STUDY  03/20/2019   Procedure: BUBBLE STUDY;  Surgeon: Josue Hector, MD;  Location: [facility] ENDOSCOPY;  Service: Cardiovascular;;  . LOOP RECORDER INSERTION N/A 03/20/2019   Procedure: LOOP RECORDER INSERTION;  Surgeon: Constance Haw, MD;  Location: [facility] LAB;  Service: Cardiovascular;  Laterality: N/A;  . TEE WITHOUT CARDIOVERSION N/A 03/20/2019   Procedure: TRANSESOPHAGEAL ECHOCARDIOGRAM (TEE);  Surgeon: Josue Hector, MD;  Location: [facility] ENDOSCOPY;  Service: Cardiovascular;  Laterality: N/A;     OB History   No obstetric history on file.     No family history on file.  Social History   Tobacco Use  . Smoking status: Current Every Day Smoker    Packs/day: 1.00    Types: Cigarettes  . Smokeless tobacco: Never Used  Substance Use Topics  . Alcohol use: Yes    Alcohol/week: 0.0 standard drinks    Comment: red wine per MD suggestion to lower cholesterol  . Drug use: Never    Home Medications Prior to Admission medications   Medication Sig Start Date End Date Taking? Authorizing Provider  acetaminophen (TYLENOL) 325 MG tablet Take 2 tablets (650 mg total) by mouth every 6 (six) hours as needed for mild pain (or Fever >/= 101). Patient taking differently: Take 325 mg by mouth every 6 (six) hours as needed for mild pain or moderate pain (or  Fever >/= 101).  03/21/19  Yes Emokpae, Courage, MD  atorvastatin (LIPITOR) 80 MG tablet Take 1 tablet (80 mg total) by mouth daily. 03/21/19 03/20/20 Yes Emokpae, Courage, MD  citalopram (CELEXA) 10 MG tablet Take 1 tablet (10 mg total) by mouth daily. 12/12/18  Yes Katha Hamming, MD  clopidogrel (PLAVIX) 75 MG tablet Take 75 mg by mouth daily.    Yes [provider]  hydrochlorothiazide (HYDRODIURIL) 25 MG tablet Take 25 mg by mouth daily.   Yes [provider]  latanoprost (XALATAN) 0.005 % ophthalmic solution Place 1 drop into both eyes at bedtime. 12/11/18  Yes Katha Hamming, MD  lisinopril  (ZESTRIL) 40 MG tablet Take 40 mg by mouth daily.   Yes [provider]  metFORMIN (GLUCOPHAGE) 850 MG tablet Take 1 tablet (850 mg total) by mouth 2 (two) times daily with a meal. 03/21/19  Yes Shon Hale, MD  Investigational - Study Medication Study name: -See attached Additional study details: -See attached BMS Axiomatic  stroke prevention trial Patient not taking: Reported on 07/26/2019 03/21/19   Shon Hale, MD  Investigational - Study Medication Study name: BMS Axiomatic  stroke prevention trial Additional study details:  BMS Axiomatic  stroke prevention trial Patient not taking: Reported on 07/26/2019 03/21/19   Shon Hale, MD  Investigational - Study Medication Study name: BMS Axiomatic  stroke prevention trial Additional study details: Patient not taking: Reported on 07/26/2019 03/21/19   Shon Hale, MD  lisinopril-hydrochlorothiazide (ZESTORETIC) 20-12.5 MG tablet Take 1 tablet by mouth daily. Start Monday 03/23/2019 Patient not taking: Reported on 07/26/2019 03/21/19   Shon Hale, MD    Allergies    Sulfur  Review of Systems   Review of Systems  Constitutional: Positive for fatigue. Negative for chills and fever.  HENT: Negative for rhinorrhea and sore throat.   Eyes: Negative for visual disturbance.  Respiratory: Negative for cough and shortness of breath.   Cardiovascular: Negative for chest pain and leg swelling.  Gastrointestinal: Negative for abdominal pain, diarrhea, nausea and vomiting.  Genitourinary: Negative for dysuria.  Musculoskeletal: Negative for back pain and neck pain.  Skin: Negative for rash.  Neurological: Positive for weakness. Negative for dizziness, light-headedness and headaches.  Hematological: Does not bruise/bleed easily.  Psychiatric/Behavioral: Negative for confusion.    Physical Exam Updated Vital Signs BP 123/71   Pulse 71   Temp 97.6 F (36.4 C) (Oral)   Resp 15   SpO2 99%   Physical  Exam Vitals and nursing note reviewed.  Constitutional:      General: She is not in acute distress.    Appearance: Normal appearance. She is well-developed.  HENT:     Head: Normocephalic and atraumatic.  Eyes:     Extraocular Movements: Extraocular movements intact.     Conjunctiva/sclera: Conjunctivae normal.     Pupils: Pupils are equal, round, and reactive to light.  Cardiovascular:     Rate and Rhythm: Normal rate and regular rhythm.     Heart sounds: No murmur.  Pulmonary:     Effort: Pulmonary effort is normal. No respiratory distress.     Breath sounds: Normal breath sounds.  Abdominal:     Palpations: Abdomen is soft.     Tenderness: There is no abdominal tenderness.  Musculoskeletal:        General: No swelling.     Cervical back: Normal range of motion and neck supple.  Skin:    General: Skin is warm and dry.  Neurological:  General: No focal deficit present.     Mental Status: She is alert and oriented to person, place, and time.     Cranial Nerves: No cranial nerve deficit.     Sensory: No sensory deficit.     Motor: No weakness.     Comments: Little bit of generalized weakness lower extremities upper extremities but no significant urgency right and left side.     ED Results / Procedures / Treatments   Labs (all labs ordered are listed, but only abnormal results are displayed) Labs Reviewed  BASIC METABOLIC PANEL - Abnormal; Notable for the following components:      Result Value   Sodium 134 (*)    Potassium 2.9 (*)    Chloride 94 (*)    Glucose, Bld 136 (*)    BUN 28 (*)    Creatinine, Ser 1.38 (*)    GFR calc non Af Amer 40 (*)    GFR calc Af Amer 47 (*)    All other components within normal limits  CBC - Abnormal; Notable for the following components:   Platelets 101 (*)    All other components within normal limits  HEPATIC FUNCTION PANEL - Abnormal; Notable for the following components:   Total Bilirubin 1.4 (*)    Bilirubin, Direct 0.3 (*)     Indirect Bilirubin 1.1 (*)    All other components within normal limits  LACTIC ACID, PLASMA - Abnormal; Notable for the following components:   Lactic Acid, Venous 3.0 (*)    All other components within normal limits  CBG MONITORING, ED - Abnormal; Notable for the following components:   Glucose-Capillary 136 (*)    All other components within normal limits  TROPONIN I (HIGH SENSITIVITY) - Abnormal; Notable for the following components:   Troponin I (High Sensitivity) 66 (*)    All other components within normal limits  TROPONIN I (HIGH SENSITIVITY) - Abnormal; Notable for the following components:   Troponin I (High Sensitivity) 76 (*)    All other components within normal limits  CULTURE, BLOOD (ROUTINE X 2)  CULTURE, BLOOD (ROUTINE X 2)  URINE CULTURE  SARS CORONAVIRUS 2 (TAT 6-24 HRS)  LIPASE, BLOOD  BRAIN NATRIURETIC PEPTIDE  URINALYSIS, ROUTINE W REFLEX MICROSCOPIC  CBC  CREATININE, SERUM  MAGNESIUM  HEMOGLOBIN A1C    EKG EKG Interpretation  Date/Time:  Sunday July 26 2019 07:45:09 EDT Ventricular Rate:  67 PR Interval:  126 QRS Duration: 76 QT Interval:  484 QTC Calculation: 511 R Axis:   -5 Text Interpretation: Normal sinus rhythm Cannot rule out Anterior infarct , age undetermined Prolonged QT Abnormal ECG Artifact Reconfirmed by Vanetta Mulders [phone]) on 07/26/2019 10:47:02 AM Also confirmed by Vanetta Mulders [phone])  on 07/26/2019 10:48:30 AM   Radiology CT Head Wo Contrast  Result Date: 07/26/2019 CLINICAL DATA:  Altered mental status. EXAM: CT HEAD WITHOUT CONTRAST TECHNIQUE: Contiguous axial images were obtained from the base of the skull through the vertex without intravenous contrast. COMPARISON:  June 14, 2019. March 18, 2019. FINDINGS: Brain: Right posterior parietal encephalomalacia is noted consistent old infarction. No mass effect or midline shift is noted. Ventricular size is within normal limits. There is no evidence of mass lesion,  hemorrhage or acute infarction. Vascular: No hyperdense vessel or unexpected calcification. Skull: Normal. Negative for fracture or focal lesion. Sinuses/Orbits: No acute finding. Other: None. IMPRESSION: Old right posterior parietal infarction. No acute intracranial abnormality seen. Electronically Signed   By: Zenda Alpers.D.  On: 07/26/2019 10:43   DG Chest Port 1 View  Result Date: 07/26/2019 CLINICAL DATA:  Weakness. EXAM: PORTABLE CHEST 1 VIEW COMPARISON:  Chest radiograph 03/18/2019 FINDINGS: Heart size within normal limits. Aortic atherosclerosis a loop recorder device projects in the region of the left lower chest. No evidence of airspace consolidation within the lungs. No evidence of pleural effusion or pneumothorax. No acute bony abnormality is identified. IMPRESSION: No evidence of acute cardiopulmonary abnormality. Aortic atherosclerosis. Implantable loop recorder device. Electronically Signed   By: Jackey Loge DO   On: 07/26/2019 09:06    Procedures Procedures (including critical care time)  Medications Ordered in ED Medications  sodium chloride flush (NS) 0.9 % injection 3 mL (3 mLs Intravenous Not Given 07/26/19 1122)  0.9 %  sodium chloride infusion ( Intravenous New Bag/Given 07/26/19 1122)  enoxaparin (LOVENOX) injection 40 mg (has no administration in time range)  0.9 % NaCl with KCl 40 mEq / L  infusion (has no administration in time range)  acetaminophen (TYLENOL) tablet 650 mg (has no administration in time range)    Or  acetaminophen (TYLENOL) suppository 650 mg (has no administration in time range)  ondansetron (ZOFRAN) tablet 4 mg (has no administration in time range)    Or  ondansetron (ZOFRAN) injection 4 mg (has no administration in time range)  atorvastatin (LIPITOR) tablet 80 mg (80 mg Oral Given 07/26/19 1539)  citalopram (CELEXA) tablet 10 mg (10 mg Oral Given 07/26/19 1539)  clopidogrel (PLAVIX) tablet 75 mg (75 mg Oral Given 07/26/19 1539)  latanoprost  (XALATAN) 0.005 % ophthalmic solution 1 drop (has no administration in time range)  insulin aspart (novoLOG) injection 0-15 Units (has no administration in time range)  insulin aspart (novoLOG) injection 0-5 Units (has no administration in time range)  sodium chloride 0.9 % bolus 500 mL (0 mLs Intravenous Stopped 07/26/19 1234)  potassium chloride SA (KLOR-CON) CR tablet 40 mEq (40 mEq Oral Given 07/26/19 1232)    ED Course  I have reviewed the triage vital signs and the nursing notes.  Pertinent labs & imaging results that were available during my care of the patient were reviewed by me and considered in my medical decision making (see chart for details).    MDM Rules/Calculators/A&P                     Patient got a broad work-up for the complaint of the generalized weakness.  Head CT without any acute new findings.  Chest x-ray without any significant findings.  Labs significant for little bit of mild hypokalemia.  Potassium 2.9.  Patient given some oral potassium.  No complaint of chest pain or discomfort at all but patient had initial troponin of 66 and then repeat was 76.  So significant delta change.  Discussed with cardiology they are aware and will follow.  They recommended medicine admission.  Patient will need serial troponins.  Covid testing ordered but patient without any [place] symptoms.  Final Clinical Impression(s) / ED Diagnoses Final diagnoses:  Generalized weakness  Elevated troponin  Hypokalemia    Rx / DC Orders ED Discharge Orders    None       Vanetta Mulders, MD 07/26/19 1558

## 2019-07-26 NOTE — Progress Notes (Signed)
Pt admitted to the unit from ED. Pt alert and verbally responsive; telemetry applied and verified with CCMD; NT called to second verify; pt skin ashen, dry and flaky; pt assessed to have 2x1cm wound to left buttock and pt said it was an opened blister. Foam dsg applied; pt oriented to the unit and room; fall/safety precaution and education completed; skin assessment completed with second RN per protocol. VSS; will continue to closely monitor and report off to oncoming RN. Dionne Bucy RN   07/26/19 1657  Vitals  Temp 98.7 F (37.1 C)  Temp Source Oral  BP 116/69  MAP (mmHg) 83  BP Location Right Arm  BP Method Automatic  Patient Position (if appropriate) Lying  Pulse Rate 68  Pulse Rate Source Monitor  Resp 18  Oxygen Therapy  SpO2 97 %  O2 Device Room Air  Pain Assessment  Pain Scale 0-10  Pain Score 0  MEWS Score  MEWS Temp 0  MEWS Systolic 0  MEWS Pulse 0  MEWS RR 0  MEWS LOC 0  MEWS Score 0  MEWS Score Color [place]

## 2019-07-26 NOTE — ED Notes (Signed)
Unable to obtain IV site . Order for IV team and asked Lab  to help with blood draw

## 2019-07-26 NOTE — Consult Note (Addendum)
Cardiology Consultation:   Patient ID: Denise Santiago; [id number]; [birth year]   Admit date: 07/26/2019 Date of Consult: 07/26/2019  Primary Care Provider: Renford Dills, MD Primary Cardiologist: Donato Schultz, MD  New Primary Electrophysiologist:  Will Jorja Loa, MD 03/20/2019 in hospital   Patient Profile:   Denise Santiago is a 64 y.o. female with a hx of DM, HTN, HLD, CVA 12/2018 & 03/2019, ongoing tob use, who is being seen today for the evaluation of elevated troponin at the request of Dr. Gustavus Messing.  History of Present Illness:   Denise Santiago was seen by Dr. Elberta Fortis 03/2019 when she was hospitalized for stroke.  She had a TEE performed and also had a loop recorder inserted.  Her loop recorder has not shown any atrial fibrillation so far.  Denise Santiago has not had any chest pain.  In the last couple of weeks, she has felt a little bit more dyspnea on exertion.  However, there has been no lower extremity edema, no orthopnea or PND.  Yesterday, she slid out of a chair onto the floor.  She states this has happened several times before.  However, this time, she could not get up.  Her niece was with her but could not get her up.  She refused to let anyone be called.  She stayed on the floor all night long.  She was very cold and uncomfortable.    This morning, her niece insisted on calling EMS.  In the emergency room, she feels very weak.  She has not had fevers or chills.  She got a Covid shot on 3/11 and feels this may be responsible for her symptoms.  She has not had any burning with urination.  She has not had any cough or cold symptoms.   Past Medical History:  Diagnosis Date  . CVA (cerebral vascular accident) (HCC) 12/2018   And 03/2019, loop recorder inserted 03/2019  . Diabetes mellitus without complication (HCC)   . Hyperlipidemia   . Hypertension   . Obesity     Past Surgical History:  Procedure Laterality Date  . ABDOMINAL HYSTERECTOMY    . BUBBLE STUDY   03/20/2019   Procedure: BUBBLE STUDY;  Surgeon: Wendall Stade, MD;  Location: [facility] ENDOSCOPY;  Service: Cardiovascular;;  . LOOP RECORDER INSERTION N/A 03/20/2019   Procedure: LOOP RECORDER INSERTION;  Surgeon: Regan Lemming, MD;  Location: [facility] INVASIVE CV LAB;  Service: Cardiovascular;  Laterality: N/A;  . TEE WITHOUT CARDIOVERSION N/A 03/20/2019   Procedure: TRANSESOPHAGEAL ECHOCARDIOGRAM (TEE);  Surgeon: Wendall Stade, MD;  Location: [facility] ENDOSCOPY;  Service: Cardiovascular;  Laterality: N/A;     Prior to Admission medications   Medication Sig Start Date End Date Taking? Authorizing Provider  acetaminophen (TYLENOL) 325 MG tablet Take 2 tablets (650 mg total) by mouth every 6 (six) hours as needed for mild pain (or Fever >/= 101). Patient taking differently: Take 325 mg by mouth every 6 (six) hours as needed for mild pain or moderate pain (or Fever >/= 101).  03/21/19  Yes Emokpae, Courage, MD  atorvastatin (LIPITOR) 80 MG tablet Take 1 tablet (80 mg total) by mouth daily. 03/21/19 03/20/20 Yes Emokpae, Courage, MD  citalopram (CELEXA) 10 MG tablet Take 1 tablet (10 mg total) by mouth daily. 12/12/18  Yes Katha Hamming, MD  clopidogrel (PLAVIX) 75 MG tablet Take 75 mg by mouth daily.    Yes [provider]  hydrochlorothiazide (HYDRODIURIL) 25 MG tablet Take 25 mg by mouth daily.   Yes [provider]  latanoprost (XALATAN) 0.005 % ophthalmic solution Place 1 drop into both eyes at bedtime. 12/11/18  Yes Epifanio Lesches, MD  lisinopril (ZESTRIL) 40 MG tablet Take 40 mg by mouth daily.   Yes [provider]  metFORMIN (GLUCOPHAGE) 850 MG tablet Take 1 tablet (850 mg total) by mouth 2 (two) times daily with a meal. 03/21/19  Yes Roxan Hockey, MD  Investigational - Study Medication Study name: -See attached Additional study details: -See attached BMS Axiomatic  stroke prevention trial Patient not taking: Reported on 07/26/2019 03/21/19   Roxan Hockey, MD  Investigational - Study Medication Study name: BMS Axiomatic  stroke prevention trial Additional study details:  BMS Axiomatic  stroke prevention trial Patient not taking: Reported on 07/26/2019 03/21/19   Roxan Hockey, MD  Investigational - Study Medication Study name: BMS Axiomatic  stroke prevention trial Additional study details: Patient not taking: Reported on 07/26/2019 03/21/19   Roxan Hockey, MD  lisinopril-hydrochlorothiazide (ZESTORETIC) 20-12.5 MG tablet Take 1 tablet by mouth daily. Start Monday 03/23/2019 Patient not taking: Reported on 07/26/2019 03/21/19   Roxan Hockey, MD    Inpatient Medications: Scheduled Meds: . atorvastatin  80 mg Oral Daily  . citalopram  10 mg Oral Daily  . clopidogrel  75 mg Oral Daily  . enoxaparin (LOVENOX) injection  40 mg Subcutaneous Q24H  . insulin aspart  0-15 Units Subcutaneous TID WC  . insulin aspart  0-5 Units Subcutaneous QHS  . latanoprost  1 drop Both Eyes QHS  . sodium chloride flush  3 mL Intravenous Once   Continuous Infusions: . sodium chloride 75 mL/hr at 07/26/19 1122  . 0.9 % NaCl with KCl 40 mEq / L     PRN Meds: acetaminophen **OR** acetaminophen, ondansetron **OR** ondansetron (ZOFRAN) IV  Allergies:    Allergies  Allergen Reactions  . Sulfur     Pt doesn't remember reaction    Social History:   Social History   Socioeconomic History  . Marital status: Single    Spouse name: none  . Number of children: Not on file  . Years of education: Not on file  . Highest education level: Not on file  Occupational History    Comment: [employer] is her employer  Tobacco Use  . Smoking status: Current Every Day Smoker    Packs/day: 1.00    Types: Cigarettes  . Smokeless tobacco: Never Used  Substance and Sexual Activity  . Alcohol use: Yes    Alcohol/week: 0.0 standard drinks    Comment: red wine per MD suggestion to lower cholesterol  . Drug use: Never  . Sexual activity:  Not on file  Other Topics Concern  . Not on file  Social History Narrative   Leave alone   1-story home with 2-3 steps front   Right hand   Soda 3 can a day   [employer] is her employer   Social Determinants of Health   Financial Resource Strain: [place]   . Difficulty of Paying Living Expenses: Not hard at all  Food Insecurity: No Food Insecurity  . Worried About Charity fundraiser in the Last Year: Never true  . Ran Out of Food in the Last Year: Never true  Transportation Needs: No Transportation Needs  . Lack of Transportation (Medical): No  . Lack of Transportation (Non-Medical): No  Physical Activity: Inactive  . Days of Exercise per Week: 0 days  . Minutes of Exercise per Session: 0 min  Stress: No Stress Concern Present  . Feeling of Stress : Not at all  Social Connections:   . Frequency of Communication with Friends and Family:   . Frequency of Social Gatherings with Friends and Family:   . Attends Religious Services:   . Active Member of Clubs or Organizations:   . Attends BankerClub or Organization Meetings:   Marland Kitchen. Marital Status:   Intimate Partner Violence:   . Fear of Current or Ex-Partner:   . Emotionally Abused:   Marland Kitchen. Physically Abused:   . Sexually Abused:     Family History:  No pertinent cardiac history  Family Status:  Family Status  Relation Name Status  . Mother  Deceased  . Father  Deceased    ROS:  Please see the history of present illness.  All other ROS reviewed and negative.     Physical Exam/Data:   Vitals:   07/26/19 1215 07/26/19 1300 07/26/19 1400 07/26/19 1500  BP:  135/69 122/64 123/71  Pulse: 69 69 71 71  Resp: 13 13 15 15   Temp:      TempSrc:      SpO2: 100% 100% 100% 99%    Intake/Output Summary (Last 24 hours) at 07/26/2019 1649 Last data filed at 07/26/2019 1234 Gross per 24 hour  Intake 500 ml  Output --  Net 500 ml    Last 3 Weights 03/20/2019 03/19/2019 03/19/2019  Weight (lbs) 220 lb 209 lb 14.1 oz  213 lb  Weight (kg) 99.791 kg 95.2 kg 96.616 kg     There is no height or weight on file to calculate BMI.   General:  Well nourished, well developed, female in no acute distress HEENT: normal Lymph: no adenopathy Neck: JVD - not elevated Endocrine:  No thryomegaly Vascular: No carotid bruits; 4/4 extremity pulses 2+  Cardiac:  normal S1, S2; RRR; no murmur Lungs:  clear bilaterally, no wheezing, rhonchi or rales  Abd: soft, nontender, no hepatomegaly  Ext: no edema Musculoskeletal:  No deformities, BUE and BLE strength normal and equal Skin: warm and dry  Neuro:  CNs 2-12 intact, no focal abnormalities noted Psych:  Normal affect   EKG:  The EKG was personally reviewed and demonstrates:  SR, no acute ischemic changes Telemetry:  Telemetry was personally reviewed and demonstrates:  SR   CV studies:   ECHO: 03/19/2019 1. Left ventricular ejection fraction, by visual estimation, is 60 to  65%. The left ventricle has normal function. There is no left ventricular  hypertrophy.  2. Left ventricular diastolic parameters are consistent with Grade I  diastolic dysfunction (impaired relaxation).  3. The left ventricle has no regional wall motion abnormalities.  4. Global right ventricle has normal systolic function.The right  ventricular size is normal. No increase in right ventricular wall  thickness.  5. Left atrial size was normal.  6. Right atrial size was normal.  7. Presence of pericardial fat pad.  8. Trivial pericardial effusion is present.  9. The mitral valve is grossly normal. No evidence of mitral valve  regurgitation.  10. The tricuspid valve is grossly normal. Tricuspid valve regurgitation  is not demonstrated.  11. The aortic valve is grossly normal. Aortic valve regurgitation is not  visualized. No evidence of aortic valve sclerosis or stenosis.  12. The pulmonic valve was grossly normal. Pulmonic valve regurgitation is  not visualized.  13. TR signal  is inadequate for assessing pulmonary artery systolic  pressure.  14. The inferior vena cava is normal in size with  greater than 50%  respiratory variability, suggesting right atrial pressure of 3 mmHg.    Laboratory Data:   Chemistry Recent Labs  Lab 07/26/19 0810  NA 134*  K 2.9*  CL 94*  CO2 27  GLUCOSE 136*  BUN 28*  CREATININE 1.38*  CALCIUM 9.5  GFRNONAA 40*  GFRAA 47*  ANIONGAP 13    Lab Results  Component Value Date   ALT 27 07/26/2019   AST 40 07/26/2019   ALKPHOS 64 07/26/2019   BILITOT 1.4 (H) 07/26/2019   Hematology Recent Labs  Lab 07/26/19 0810  WBC 4.7  RBC 4.99  HGB 15.0  HCT 44.3  MCV 88.8  MCH 30.1  MCHC 33.9  RDW 12.6  PLT 101*   Cardiac Enzymes High Sensitivity Troponin:   Recent Labs  Lab 07/26/19 0810 07/26/19 1104  TROPONINIHS 66* 76*     No results found for: CKTOTAL, CKMB, CKMBINDEX, TROPONINI  BNP Recent Labs  Lab 07/26/19 0810  BNP 64.2     TSH: No results found for: TSH Lipids: Lab Results  Component Value Date   CHOL 108 03/20/2019   HDL 30 (L) 03/20/2019   LDLCALC 70 03/20/2019   TRIG 41 03/20/2019   CHOLHDL 3.6 03/20/2019   HgbA1c: Lab Results  Component Value Date   HGBA1C 6.4 (H) 03/19/2019   Magnesium:  Magnesium  Date Value Ref Range Status  03/19/2019 2.1 1.7 - 2.4 mg/dL Final    Comment:    Performed at [facility], [address] [place], [place] [address]     Radiology/Studies:  CT Head Wo Contrast  Result Date: 07/26/2019 CLINICAL DATA:  Altered mental status. EXAM: CT HEAD WITHOUT CONTRAST TECHNIQUE: Contiguous axial images were obtained from the base of the skull through the vertex without intravenous contrast. COMPARISON:  June 14, 2019. March 18, 2019. FINDINGS: Brain: Right posterior parietal encephalomalacia is noted consistent old infarction. No mass effect or midline shift is noted. Ventricular size is within normal limits. There is no evidence of mass lesion,  hemorrhage or acute infarction. Vascular: No hyperdense vessel or unexpected calcification. Skull: Normal. Negative for fracture or focal lesion. Sinuses/Orbits: No acute finding. Other: None. IMPRESSION: Old right posterior parietal infarction. No acute intracranial abnormality seen. Electronically Signed   By: Lupita Raider M.D.   On: 07/26/2019 10:43   DG Chest Port 1 View  Result Date: 07/26/2019 CLINICAL DATA:  Weakness. EXAM: PORTABLE CHEST 1 VIEW COMPARISON:  Chest radiograph 03/18/2019 FINDINGS: Heart size within normal limits. Aortic atherosclerosis a loop recorder device projects in the region of the left lower chest. No evidence of airspace consolidation within the lungs. No evidence of pleural effusion or pneumothorax. No acute bony abnormality is identified. IMPRESSION: No evidence of acute cardiopulmonary abnormality. Aortic atherosclerosis. Implantable loop recorder device. Electronically Signed   By: Jackey Loge DO   On: 07/26/2019 09:06    Assessment and Plan:   1. Elevated troponin - in the setting of pt lying on the floor all night - will ck CKMB - no S&S of ischemia or CHF, so do not think another echo needed - EF nl w/ no WMA and no sig valvular abnormalities on echo 03/2019. - recommend holding HCTZ, since low K+ may be contributing to sx - MD advise if BB indicated - MD advise on any further eval.  2. Hypokalemia - may have contributed to weakness - has had Kdur 40 meq and IVF have KCl - however, w/ K+ 2.9  this am, will give another 40 meq KCl - recommend d/c HCTZ, consider K+ sparing diuretic such as spiro or Chlorthalidone, w/ close f/u of labs as outpt  Otherwise, per IM Principal Problem:   Generalized weakness Active Problems:   Essential hypertension   Type 2 diabetes mellitus without complication (HCC)   CVA (cerebral vascular accident) (HCC)   Hyperlipidemia   Hypokalemia   Hyponatremia   Acute on chronic kidney failure (HCC)   Thrombocytopenia  (HCC)   Elevated troponin   Lactic acidosis     For questions or updates, please contact [facility] Please consult [email or web address] for contact info under Cardiology/STEMI.   Signed, Theodore Demark, PA-C  07/26/2019 4:49 PM  Personally seen and examined. Agree with above.   64 year old with 2 prior strokes diabetes hypertension hyperlipidemia who unfortunately felt weak yesterday slid out of her chair, no loss of consciousness and slept on the floor overnight.  Finally family member came this morning and because of her weakness called EMS.  Potassium was 2.9 on arrival.  She has been on HCTZ.  She has an implantable loop recorder from her prior strokes.  No evidence of conduction disease nor atrial fibrillation.  Normal EF on echo recently.  Here in the emergency department troponin was drawn and was mildly abnormal.  Flat.  GEN: Well nourished, well developed, in no acute distress  HEENT: normal Edentulous Neck: no JVD, carotid bruits, or masses Cardiac: RRR; no murmurs, rubs, or gallops,no edema  Respiratory:  clear to auscultation bilaterally, normal work of breathing GI: soft, nontender, nondistended, + BS MS: no deformity or atrophy  Skin: warm and dry, no rash Neuro:  Alert and Oriented x 3, Strength and sensation are intact Psych: euthymic mood, full affect  EKG shows sinus rhythm without any acute changes.  Assessment and plan:  Elevated troponin -Minimally elevated, flat in the setting of being on the floor all night.  Checking a CK.  No signs of ischemia.  No chest pain.  Do not think that an echocardiogram is necessary.  No further ischemic work-up.  This is not ACS.  Hypokalemia -With her potassium of 2.9, this may have precipitated her weakness.  Repleting. -Would recommend discontinuation of hydrochlorothiazide.  Please let us know if we can be of further assistance.  We will go ahead and sign off.  Donato Schultz, MD

## 2019-07-26 NOTE — H&P (Signed)
History and Physical    [name] VPX:[id number] DOB: August 27, 1955 DOA: 07/26/2019  PCP: Seward Carol, MD  Patient coming from: Home I have personally briefly reviewed patient's old medical records in [place]  Chief Complaint: Generalized weakness  HPI: Denise Santiago is a 64 y.o. female with medical history significant of hypertension, diabetes mellitus, hyperlipidemia, CVA on 12/25/2018 and 03/27/2019 (has loop recorder), tobacco abuse presents to emergency department due to generalized weakness.  Patient tells me that she received Covid vaccine last Saturday and since then she has been feeling weak, lethargic, has no energy and has decreased appetite.  Yesterday  she slid out of her chair onto the floor and was not able to get up due to generalized weakness.  She stayed on the floor all night long.  Her niece was with her at that time but she could not get her up as well.  EMS was called this morning and brought here to the emergency department for further evaluation and management.  Patient denies headache, blurry vision, slurred speech, head trauma, loss of consciousness, seizures, chest pain, shortness of breath, palpitation, leg swelling, fever, chills, nausea, vomiting, diarrhea, cough, congestion, wheezing, dysuria or any other urinary symptoms.  She lives alone and uses walker and cane for ambulation.  She continues to smoke 1 pack of cigarettes per day however denies alcohol, recent drug use.  ED Course: Upon arrival to ED: Patient's vital signs stable.  CMP shows potassium of 2.9, sodium of 134, chloride: 94, creatinine: 1.38, GFR: 47 (baseline creatinine 1.26, GFR 52) she is afebrile with no leukocytosis.  Lipase, BMP: WNL.  Initial troponin 66 trended up to 76.  Lactic acid: 3.0, chest x-ray and CT head came back negative for acute findings.  COVID-19 pending.  EDP consulted cardiology for elevated troponin.  [facility] hospitalist consulted for observation overnight for  hypokalemia and elevated troponin.  Review of Systems: As per HPI otherwise negative.    Past Medical History:  Diagnosis Date  . Diabetes mellitus without complication ([place])   . Hyperlipidemia   . Hypertension   . Obesity     Past Surgical History:  Procedure Laterality Date  . ABDOMINAL HYSTERECTOMY    . BUBBLE STUDY  03/20/2019   Procedure: BUBBLE STUDY;  Surgeon: Josue Hector, MD;  Location: [facility] ENDOSCOPY;  Service: Cardiovascular;;  . LOOP RECORDER INSERTION N/A 03/20/2019   Procedure: LOOP RECORDER INSERTION;  Surgeon: Constance Haw, MD;  Location: [place] CV LAB;  Service: Cardiovascular;  Laterality: N/A;  . TEE WITHOUT CARDIOVERSION N/A 03/20/2019   Procedure: TRANSESOPHAGEAL ECHOCARDIOGRAM (TEE);  Surgeon: Josue Hector, MD;  Location: [facility] ENDOSCOPY;  Service: Cardiovascular;  Laterality: N/A;     reports that she has been smoking cigarettes. She has been smoking about 1.00 pack per day. She has never used smokeless tobacco. She reports current alcohol use. She reports that she does not use drugs.  Allergies  Allergen Reactions  . Sulfur     Pt doesn't remember reaction    No family history on file.  Prior to Admission medications   Medication Sig Start Date End Date Taking? Authorizing Provider  acetaminophen (TYLENOL) 325 MG tablet Take 2 tablets (650 mg total) by mouth every 6 (six) hours as needed for mild pain (or Fever >/= 101). Patient taking differently: Take 325 mg by mouth every 6 (six) hours as needed for mild pain or moderate pain (or Fever >/= 101).  03/21/19  Yes Roxan Hockey, MD  atorvastatin (  LIPITOR) 80 MG tablet Take 1 tablet (80 mg total) by mouth daily. 03/21/19 03/20/20 Yes Emokpae, Courage, MD  citalopram (CELEXA) 10 MG tablet Take 1 tablet (10 mg total) by mouth daily. 12/12/18  Yes Katha Hamming, MD  clopidogrel (PLAVIX) 75 MG tablet Take 75 mg by mouth daily.    Yes [provider]  hydrochlorothiazide  (HYDRODIURIL) 25 MG tablet Take 25 mg by mouth daily.   Yes [provider]  latanoprost (XALATAN) 0.005 % ophthalmic solution Place 1 drop into both eyes at bedtime. 12/11/18  Yes Katha Hamming, MD  lisinopril (ZESTRIL) 40 MG tablet Take 40 mg by mouth daily.   Yes [provider]  metFORMIN (GLUCOPHAGE) 850 MG tablet Take 1 tablet (850 mg total) by mouth 2 (two) times daily with a meal. 03/21/19  Yes Shon Hale, MD  Investigational - Study Medication Study name: -See attached Additional study details: -See attached BMS Axiomatic  stroke prevention trial Patient not taking: Reported on 07/26/2019 03/21/19   Shon Hale, MD  Investigational - Study Medication Study name: BMS Axiomatic  stroke prevention trial Additional study details:  BMS Axiomatic  stroke prevention trial Patient not taking: Reported on 07/26/2019 03/21/19   Shon Hale, MD  Investigational - Study Medication Study name: BMS Axiomatic  stroke prevention trial Additional study details: Patient not taking: Reported on 07/26/2019 03/21/19   Shon Hale, MD  lisinopril-hydrochlorothiazide (ZESTORETIC) 20-12.5 MG tablet Take 1 tablet by mouth daily. Start Monday 03/23/2019 Patient not taking: Reported on 07/26/2019 03/21/19   Shon Hale, MD    Physical Exam: Vitals:   07/26/19 1200 07/26/19 1215 07/26/19 1300 07/26/19 1400  BP: 140/65  135/69 122/64  Pulse: 74 69 69 71  Resp: 13 13 13 15   Temp:      TempSrc:      SpO2: 100% 100% 100% 100%    Constitutional: NAD, calm, comfortable, communicating well. Eyes: PERRL, lids and conjunctivae normal ENMT: Mucous membranes are moist. Posterior pharynx clear of any exudate or lesions.Normal dentition.  Neck: normal, supple, no masses, no thyromegaly Respiratory: clear to auscultation bilaterally, no wheezing, no crackles. Normal respiratory effort. No accessory muscle use.  Cardiovascular: Regular rate and rhythm, no murmurs / rubs  / gallops. No extremity edema. 2+ pedal pulses. No carotid bruits.  Abdomen: no tenderness, no masses palpated. No hepatosplenomegaly. Bowel sounds positive.  Musculoskeletal: no clubbing / cyanosis. No joint deformity upper and lower extremities. Good ROM, no contractures. Normal muscle tone.  Skin: no rashes, lesions, ulcers. No induration Neurologic: CN 2-12 grossly intact. Sensation intact, DTR normal.  Strength 5 out of 5 in bilateral upper extremities, 3 out of 5 in right lower extremity and 2 out of 5 in left lower extremity. Psychiatric: Normal judgment and insight. Alert and oriented x 3. Normal mood.    Labs on Admission: I have personally reviewed following labs and imaging studies  CBC: Recent Labs  Lab 07/26/19 0810  WBC 4.7  HGB 15.0  HCT 44.3  MCV 88.8  PLT 101*   Basic Metabolic Panel: Recent Labs  Lab 07/26/19 0810  NA 134*  K 2.9*  CL 94*  CO2 27  GLUCOSE 136*  BUN 28*  CREATININE 1.38*  CALCIUM 9.5   GFR: CrCl cannot be calculated (Unknown ideal weight.). Liver Function Tests: Recent Labs  Lab 07/26/19 0810  AST 40  ALT 27  ALKPHOS 64  BILITOT 1.4*  PROT 7.7  ALBUMIN 3.5   Recent Labs  Lab 07/26/19 0810  LIPASE 24   No results for input(s): AMMONIA in the last 168 hours. Coagulation Profile: No results for input(s): INR, PROTIME in the last 168 hours. Cardiac Enzymes: No results for input(s): CKTOTAL, CKMB, CKMBINDEX, TROPONINI in the last 168 hours. BNP (last 3 results) No results for input(s): PROBNP in the last 8760 hours. HbA1C: No results for input(s): HGBA1C in the last 72 hours. CBG: Recent Labs  Lab 07/26/19 1135  GLUCAP 136*   Lipid Profile: No results for input(s): CHOL, HDL, LDLCALC, TRIG, CHOLHDL, LDLDIRECT in the last 72 hours. Thyroid Function Tests: No results for input(s): TSH, T4TOTAL, FREET4, T3FREE, THYROIDAB in the last 72 hours. Anemia Panel: No results for input(s): VITAMINB12, FOLATE, FERRITIN, TIBC,  IRON, RETICCTPCT in the last 72 hours. Urine analysis:    Component Value Date/Time   COLORURINE YELLOW 03/18/2019 2208   APPEARANCEUR CLEAR 03/18/2019 2208   LABSPEC 1.009 03/18/2019 2208   PHURINE 7.0 03/18/2019 2208   GLUCOSEU NEGATIVE 03/18/2019 2208   HGBUR NEGATIVE 03/18/2019 2208   BILIRUBINUR NEGATIVE 03/18/2019 2208   KETONESUR NEGATIVE 03/18/2019 2208   PROTEINUR NEGATIVE 03/18/2019 2208   NITRITE NEGATIVE 03/18/2019 2208   LEUKOCYTESUR NEGATIVE 03/18/2019 2208    Radiological Exams on Admission: CT Head Wo Contrast  Result Date: 07/26/2019 CLINICAL DATA:  Altered mental status. EXAM: CT HEAD WITHOUT CONTRAST TECHNIQUE: Contiguous axial images were obtained from the base of the skull through the vertex without intravenous contrast. COMPARISON:  June 14, 2019. March 18, 2019. FINDINGS: Brain: Right posterior parietal encephalomalacia is noted consistent old infarction. No mass effect or midline shift is noted. Ventricular size is within normal limits. There is no evidence of mass lesion, hemorrhage or acute infarction. Vascular: No hyperdense vessel or unexpected calcification. Skull: Normal. Negative for fracture or focal lesion. Sinuses/Orbits: No acute finding. Other: None. IMPRESSION: Old right posterior parietal infarction. No acute intracranial abnormality seen. Electronically Signed   By: Lupita Raider M.D.   On: 07/26/2019 10:43   DG Chest Port 1 View  Result Date: 07/26/2019 CLINICAL DATA:  Weakness. EXAM: PORTABLE CHEST 1 VIEW COMPARISON:  Chest radiograph 03/18/2019 FINDINGS: Heart size within normal limits. Aortic atherosclerosis a loop recorder device projects in the region of the left lower chest. No evidence of airspace consolidation within the lungs. No evidence of pleural effusion or pneumothorax. No acute bony abnormality is identified. IMPRESSION: No evidence of acute cardiopulmonary abnormality. Aortic atherosclerosis. Implantable loop recorder device.  Electronically Signed   By: Jackey Loge DO   On: 07/26/2019 09:06    EKG: Normal sinus rhythm, no ST elevation or depression noted.  Assessment/Plan Principal Problem:   Generalized weakness Active Problems:   Essential hypertension   Type 2 diabetes mellitus without complication (HCC)   CVA (cerebral vascular accident) (HCC)   Hyperlipidemia   Hypokalemia   Hyponatremia   Acute on chronic kidney failure (HCC)   Thrombocytopenia (HCC)   Elevated troponin   Lactic acidosis   Generalized weakness: -Likely secondary to electrolyte abnormality secondary to decreased p.o. intake. -Potassium: 2.9, sodium: 134, chloride: 94.  Lactic acid elevated at 3.0. -Patient received IV fluids in ED will continue same.  Replace potassium.  Check magnesium.  On telemetry.  Lipase: WNL, BMP: WNL. -UA, blood culture, COVID-19 pending. -CT head and chest x-ray negative for acute findings. -On fall precautions. -Consulted PT  Elevated troponin: -EKG: No ST changes.  Patient denies any ACS symptoms.  Trend troponin. -Cardiology consulted-appreciate help -Continue statin, Plavix  Hypertension: Pressure  is stable -Hold lisinopril-HCTZ for now due to worsening kidney function. -Monitor blood pressure closely.  Type 2 diabetes mellitus: Check A1c -Hold Metformin due to lactic acidosis -Start on sliding scale insulin and monitor blood sugar closely  AKI on CKD stage III: -Creatinine: 1.38, GFR: 47 (baseline creatinine 1.26, GFR 52) likely secondary to dehydration due to decreased p.o. intake.  Avoid nephrotoxic medication-Metformin, HCTZ, lisinopril.  Continue IV fluids -Repeat BMP tomorrow a.m.  Hyperlipidemia: Continue statin  Thrombocytopenia: Platelet: 101 -No signs of active bleeding.  Depression: Continue Celexa  History of stroke x2: Patient has loop recorder -Continue statin, Plavix.  DVT prophylaxis: No chemical anticoagulation due to thrombocytopenia, SCD/Ted Code Status:  DNR-confirmed with the patient Family Communication: None present at bedside.  Plan of care discussed with patient in length and she verbalized understanding and agreed with it. Disposition Plan: Likely home tomorrow Consults called: Cardiology by EDP Admission status: Observation  Ollen Bowl MD [facility] Pager [phone]  If 7PM-7AM, please contact night-coverage [email or web address] Password [facility]  07/26/2019, 3:11 PM

## 2019-07-26 NOTE — ED Notes (Signed)
Pt assisted into bed with 2-staff assist. Pt very drowsy and lethargic.

## 2019-07-27 DIAGNOSIS — N179 Acute kidney failure, unspecified: Secondary | ICD-10-CM | POA: Diagnosis present

## 2019-07-27 DIAGNOSIS — E119 Type 2 diabetes mellitus without complications: Secondary | ICD-10-CM

## 2019-07-27 DIAGNOSIS — D696 Thrombocytopenia, unspecified: Secondary | ICD-10-CM

## 2019-07-27 LAB — CBC
HCT: 42 % (ref 36.0–46.0)
Hemoglobin: 14.4 g/dL (ref 12.0–15.0)
MCH: 30.5 pg (ref 26.0–34.0)
MCHC: 34.3 g/dL (ref 30.0–36.0)
MCV: 89 fL (ref 80.0–100.0)
Platelets: 97 10*3/uL — ABNORMAL LOW (ref 150–400)
RBC: 4.72 MIL/uL (ref 3.87–5.11)
RDW: 12.6 % (ref 11.5–15.5)
WBC: 4.7 10*3/uL (ref 4.0–10.5)
nRBC: 0 % (ref 0.0–0.2)

## 2019-07-27 LAB — GLUCOSE, CAPILLARY
Glucose-Capillary: 136 mg/dL — ABNORMAL HIGH (ref 70–99)
Glucose-Capillary: 92 mg/dL (ref 70–99)
Glucose-Capillary: 115 mg/dL — ABNORMAL HIGH (ref 70–99)
Glucose-Capillary: 282 mg/dL — ABNORMAL HIGH (ref 70–99)

## 2019-07-27 LAB — BASIC METABOLIC PANEL
Anion gap: 12 (ref 5–15)
BUN: 20 mg/dL (ref 8–23)
CO2: 26 mmol/L (ref 22–32)
Calcium: 9.1 mg/dL (ref 8.9–10.3)
Chloride: 99 mmol/L (ref 98–111)
Creatinine, Ser: 1.17 mg/dL — ABNORMAL HIGH (ref 0.44–1.00)
GFR calc Af Amer: 57 mL/min — ABNORMAL LOW (ref >60)
GFR calc non Af Amer: 49 mL/min — ABNORMAL LOW (ref >60)
Glucose, Bld: 127 mg/dL — ABNORMAL HIGH (ref 70–99)
Potassium: 3.3 mmol/L — ABNORMAL LOW (ref 3.5–5.1)
Sodium: 137 mmol/L (ref 135–145)

## 2019-07-27 LAB — URINALYSIS, ROUTINE W REFLEX MICROSCOPIC
Bilirubin Urine: NEGATIVE
Glucose, UA: NEGATIVE mg/dL
Hgb urine dipstick: NEGATIVE
Ketones, ur: 5 mg/dL — AB
Leukocytes,Ua: NEGATIVE
Nitrite: NEGATIVE
Protein, ur: NEGATIVE mg/dL
Specific Gravity, Urine: 1.027 (ref 1.005–1.030)
pH: 6 (ref 5.0–8.0)

## 2019-07-27 LAB — CK: Total CK: 223 U/L (ref 38–234)

## 2019-07-27 MED ORDER — POTASSIUM CHLORIDE CRYS ER 20 MEQ PO TBCR
40.0000 meq | EXTENDED_RELEASE_TABLET | Freq: Once | ORAL | Status: AC
  Administered 2019-07-27: 08:00:00 40 meq via ORAL
  Filled 2019-07-27: qty 2

## 2019-07-27 MED ORDER — NICOTINE 21 MG/24HR TD PT24
21.0000 mg | MEDICATED_PATCH | Freq: Every day | TRANSDERMAL | Status: DC
  Administered 2019-07-27 – 2019-08-26 (×31): 21 mg via TRANSDERMAL
  Filled 2019-07-27 (×32): qty 1

## 2019-07-27 NOTE — Progress Notes (Signed)
Progress Note  Patient Name: Denise Santiago Date of Encounter: 07/27/2019  Primary Cardiologist:   Donato Schultz, MD   Subjective   She denies any chest pain or SOB either currently or at home before admission  Inpatient Medications    Scheduled Meds: . atorvastatin  80 mg Oral Daily  . citalopram  10 mg Oral Daily  . clopidogrel  75 mg Oral Daily  . insulin aspart  0-15 Units Subcutaneous TID WC  . insulin aspart  0-5 Units Subcutaneous QHS  . latanoprost  1 drop Both Eyes QHS  . nicotine  21 mg Transdermal Daily  . sodium chloride flush  3 mL Intravenous Once   Continuous Infusions: . 0.9 % NaCl with KCl 40 mEq / L 75 mL/hr (07/27/19 0814)   PRN Meds: acetaminophen **OR** acetaminophen, ondansetron **OR** ondansetron (ZOFRAN) IV   Vital Signs    Vitals:   07/27/19 0015 07/27/19 0354 07/27/19 0450 07/27/19 0808  BP: 111/62 120/62  115/72  Pulse: 74 70  68  Resp: 17 18  20   Temp: 97.7 F (36.5 C) 98.5 F (36.9 C)  97.9 F (36.6 C)  TempSrc: Oral Oral  Oral  SpO2: (!) 88% 100%  100%  Weight:   83.1 kg     Intake/Output Summary (Last 24 hours) at 07/27/2019 0956 Last data filed at 07/27/2019 0300 Gross per 24 hour  Intake 1637.53 ml  Output --  Net 1637.53 ml   Filed Weights   07/27/19 0450  Weight: 83.1 kg    Telemetry    NSR - Personally Reviewed  ECG    NA - Personally Reviewed  Physical Exam   GEN: No acute distress.   Neck: No  JVD Cardiac: RRR, no murmurs, rubs, or gallops.  Respiratory: Clear  to auscultation bilaterally. GI: Soft, nontender, non-distended  MS: No  edema; No deformity. Neuro:  Nonfocal  Psych: Normal affect   Labs    Chemistry Recent Labs  Lab 07/26/19 0810 07/26/19 1709 07/27/19 0410  NA 134*  --  137  K 2.9*  --  3.3*  CL 94*  --  99  CO2 27  --  26  GLUCOSE 136*  --  127*  BUN 28*  --  20  CREATININE 1.38* 1.04* 1.17*  CALCIUM 9.5  --  9.1  PROT 7.7  --   --   ALBUMIN 3.5  --   --   AST 40  --    --   ALT 27  --   --   ALKPHOS 64  --   --   BILITOT 1.4*  --   --   GFRNONAA 40* 57* 49*  GFRAA 47* >60 57*  ANIONGAP 13  --  12     Hematology Recent Labs  Lab 07/26/19 0810 07/26/19 1709 07/27/19 0410  WBC 4.7 5.4 4.7  RBC 4.99 5.28* 4.72  HGB 15.0 16.2* 14.4  HCT 44.3 46.7* 42.0  MCV 88.8 88.4 89.0  MCH 30.1 30.7 30.5  MCHC 33.9 34.7 34.3  RDW 12.6 12.6 12.6  PLT 101* 88* 97*    Cardiac EnzymesNo results for input(s): TROPONINI in the last 168 hours. No results for input(s): TROPIPOC in the last 168 hours.   BNP Recent Labs  Lab 07/26/19 0810  BNP 64.2     DDimer No results for input(s): DDIMER in the last 168 hours.   Radiology    CT Head Wo Contrast  Result Date: 07/26/2019 CLINICAL DATA:  Altered  mental status. EXAM: CT HEAD WITHOUT CONTRAST TECHNIQUE: Contiguous axial images were obtained from the base of the skull through the vertex without intravenous contrast. COMPARISON:  June 14, 2019. March 18, 2019. FINDINGS: Brain: Right posterior parietal encephalomalacia is noted consistent old infarction. No mass effect or midline shift is noted. Ventricular size is within normal limits. There is no evidence of mass lesion, hemorrhage or acute infarction. Vascular: No hyperdense vessel or unexpected calcification. Skull: Normal. Negative for fracture or focal lesion. Sinuses/Orbits: No acute finding. Other: None. IMPRESSION: Old right posterior parietal infarction. No acute intracranial abnormality seen. Electronically Signed   By: Marijo Conception M.D.   On: 07/26/2019 10:43   DG Chest Port 1 View  Result Date: 07/26/2019 CLINICAL DATA:  Weakness. EXAM: PORTABLE CHEST 1 VIEW COMPARISON:  Chest radiograph 03/18/2019 FINDINGS: Heart size within normal limits. Aortic atherosclerosis a loop recorder device projects in the region of the left lower chest. No evidence of airspace consolidation within the lungs. No evidence of pleural effusion or pneumothorax. No acute  bony abnormality is identified. IMPRESSION: No evidence of acute cardiopulmonary abnormality. Aortic atherosclerosis. Implantable loop recorder device. Electronically Signed   By: Kellie Simmering DO   On: 07/26/2019 09:06    Cardiac Studies   ECHO:  1. Left ventricular ejection fraction, by visual estimation, is 60 to  65%. The left ventricle has normal function. There is no left ventricular  hypertrophy.  2. Left ventricular diastolic parameters are consistent with Grade I  diastolic dysfunction (impaired relaxation).  3. The left ventricle has no regional wall motion abnormalities.  4. Global right ventricle has normal systolic function.The right  ventricular size is normal. No increase in right ventricular wall  thickness.  5. Left atrial size was normal.  6. Right atrial size was normal.  7. Presence of pericardial fat pad.  8. Trivial pericardial effusion is present.  9. The mitral valve is grossly normal. No evidence of mitral valve  regurgitation.  10. The tricuspid valve is grossly normal. Tricuspid valve regurgitation  is not demonstrated.  11. The aortic valve is grossly normal. Aortic valve regurgitation is not  visualized. No evidence of aortic valve sclerosis or stenosis.  12. The pulmonic valve was grossly normal. Pulmonic valve regurgitation is  not visualized.  13. TR signal is inadequate for assessing pulmonary artery systolic  pressure.  14. The inferior vena cava is normal in size with greater than 50%  respiratory variability, suggesting right atrial pressure of 3 mmHg.   Patient Profile     64 y.o. female   with a hx of DM, HTN, HLD, CVA 12/2018 & 03/2019, ongoing tob use, who is being seen for the evaluation of elevated troponin at the request of Dr. Bobby Rumpf.  Assessment & Plan    ELEVATED TROPOININ:   CK MB also elevated.  Troponin is non specific in this setting.  I will repeat an EKG.  If this is unchanged this morning I will arrange an out  patient Lexiscan Myoview.  PROLONGED QT:  Follow up EKG as above.   Mag 2.7.     For questions or updates, please contact [place] Please consult [email or web address] for contact info under Cardiology/STEMI.   Signed, Minus Breeding, MD  07/27/2019, 9:56 AM

## 2019-07-27 NOTE — Progress Notes (Signed)
Rehab Admissions Coordinator Note:  Per PT recommendation, this patient was screened by Cheri Rous for appropriateness for an Inpatient Acute Rehab Consult.  At this time, pt remains in observation status per chart review. It does not appear that this patient has the medical complexity to warrant an IP Rehab stay at this time. AC will not pursue CIR consult.   Cheri Rous 07/27/2019, 2:28 PM  I can be reached at [phone].

## 2019-07-27 NOTE — Evaluation (Signed)
Physical Therapy Evaluation Patient Details Name: Denise Santiago MRN: [id number] DOB: 1955-05-25 Today's Date: 07/27/2019   History of Present Illness  64 y.o. female   with a hx of DM, HTN, HLD, CVA 12/2018 & 03/2019, ongoing tob use who presented to ED with generalized weakness and elevated troponin, thought to be non specific by cards. The day prior to admission pt slid out of chair to floor and remained through the night.   Clinical Impression  Pt admitted with above diagnosis. Pt reports that she has been working remotely, teaching a community college class, as well as driving to an [employer] where she works part time. No family present to verify if she was doing this as recently as last week. Presently, pt lethargic, kept eyes closed through much of eval. Mod A needed for LE's off bed and min A to rise to sitting. Mod A needed for sit<>stand. Pt took sidesteps EOB with RW and fwd flexed posture and then needed to sit back down due to fatigue. Would not recommend pt being home alone at current functional level, anticipate her needing post acute rehab to return to prior function. Pt agreeable to this.  Pt currently with functional limitations due to the deficits listed below (see PT Problem List). Pt will benefit from skilled PT to increase their independence and safety with mobility to allow discharge to the venue listed below.       Follow Up Recommendations CIR;Supervision/Assistance - 24 hour    Equipment Recommendations  None recommended by PT    Recommendations for Other Services Rehab consult;OT consult     Precautions / Restrictions Precautions Precautions: Fall Restrictions Weight Bearing Restrictions: No      Mobility  Bed Mobility Overal bed mobility: Needs Assistance Bed Mobility: Supine to Sit;Sit to Supine     Supine to sit: Mod assist;Min assist Sit to supine: Mod assist   General bed mobility comments: mod A for LE's off EOB, min A for elevation of  trunk into sitting. Pt has difficulty scooting EOB to position self. Mod A for LE's back into bed  Transfers Overall transfer level: Needs assistance Equipment used: Rolling walker (2 wheeled) Transfers: Sit to/from Stand Sit to Stand: Mod assist         General transfer comment: mod A for power up to RW  Ambulation/Gait Ambulation/Gait assistance: Min assist Gait Distance (Feet): 2 Feet Assistive device: Rolling walker (2 wheeled) Gait Pattern/deviations: Step-to pattern Gait velocity: decreased Gait velocity interpretation: <1.31 ft/sec, indicative of household ambulator General Gait Details: side steps to R with RW, min A for support and vc's. Pt with flexed trunk and fatigued very quickly  Stairs            Wheelchair Mobility    Modified Rankin (Stroke Patients Only) Modified Rankin (Stroke Patients Only) Pre-Morbid Rankin Score: No significant disability Modified Rankin: Moderately severe disability     Balance Overall balance assessment: Needs assistance;History of Falls Sitting-balance support: Feet supported;Bilateral upper extremity supported Sitting balance-Leahy Scale: Fair Sitting balance - Comments: maintained balance sitting EOB   Standing balance support: Bilateral upper extremity supported Standing balance-Leahy Scale: Poor Standing balance comment: unable to stand without UE support                             Pertinent Vitals/Pain Pain Assessment: Faces Faces Pain Scale: Hurts little more Pain Location: L anterior knee Pain Descriptors / Indicators: Tender Pain Intervention(s): Limited activity  within patient's tolerance;Monitored during session    Home Living Family/patient expects to be discharged to:: Private residence Living Arrangements: Alone Available Help at Discharge: Family;Available PRN/intermittently Type of Home: House Home Access: Stairs to enter Entrance Stairs-Rails: Right Entrance Stairs-Number of Steps:  3 Home Layout: One level Home Equipment: Toilet riser;Cane - single point;Shower seat;Walker - 2 wheels Additional Comments: sister and niece help periodically but no one can help her 24/7    Prior Function Level of Independence: Independent with assistive device(s)         Comments: sometimes uses cane, sometimes RW. Pt reports she still drives and works. Pt reports she sleeps in her recliner because it's difficult to get in and OOB and has fallen out several times. She was not trying to get up when she slid out.      Hand Dominance   Dominant Hand: Right    Extremity/Trunk Assessment   Upper Extremity Assessment Upper Extremity Assessment: Defer to OT evaluation;LUE deficits/detail LUE Deficits / Details: unable to flex L shoulder >90 deg and notably uncomfortable    Lower Extremity Assessment Lower Extremity Assessment: LLE deficits/detail;Generalized weakness LLE Deficits / Details: hip flex 3-/5, knee ext 3+/5, knee flex 3+/5. Point tenderness noted anterior R knee.  LLE Sensation: WNL LLE Coordination: WNL    Cervical / Trunk Assessment Cervical / Trunk Assessment: Kyphotic  Communication   Communication: No difficulties  Cognition Arousal/Alertness: Lethargic Behavior During Therapy: Flat affect Overall Cognitive Status: Impaired/Different from baseline Area of Impairment: Memory;Problem solving                     Memory: Decreased short-term memory       Problem Solving: Slow processing General Comments: pt lethargic throughout eval. Appropriate with responses to questions but question whether her timeline is accurate for last couple of months since her CVA's. No family present to verify.       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 Pt reports that she just did  this on 3/19 but question the accuracy of this.     Exercises     Assessment/Plan    PT Assessment Patient needs continued PT services  PT Problem List Decreased strength;Decreased activity tolerance;Decreased balance;Decreased range of motion;Decreased mobility;Decreased coordination;Decreased cognition;Decreased knowledge of precautions;Pain;Obesity       PT Treatment Interventions DME instruction;Gait training;Stair training;Functional mobility training;Therapeutic activities;Therapeutic exercise;Balance training;Neuromuscular re-education;Cognitive remediation;Patient/family education    PT Goals (Current goals can be found in the Care Plan section)  Acute Rehab PT Goals Patient Stated Goal: return to PLOF PT Goal Formulation: With patient Time For Goal Achievement: 08/10/19 Potential to Achieve Goals: Good    Frequency Min 4X/week   Barriers to discharge Decreased caregiver support pt unsure if her sister could stay with her more    Co-evaluation               AM-PAC PT "6 Clicks" Mobility  Outcome Measure Help needed turning from your back to your side while in a flat bed without using bedrails?: A Little Help needed moving from lying on your back to sitting on the side of a flat bed without using bedrails?: A Lot Help needed moving to and from a bed to a chair (including a wheelchair)?: A Lot Help needed standing up from a chair using your arms (  e.g., wheelchair or bedside chair)?: A Lot Help needed to walk in hospital room?: Total Help needed climbing 3-5 steps with a railing? : Total 6 Click Score: 11    End of Session Equipment Utilized During Treatment: Gait belt Activity Tolerance: Patient limited by fatigue;Patient limited by lethargy Patient left: in bed;with call bell/phone within reach;with bed alarm set Nurse Communication: Mobility status PT Visit Diagnosis: Unsteadiness on feet (R26.81);Muscle weakness (generalized) (M62.81);Repeated falls  (R29.6);Difficulty in walking, not elsewhere classified (R26.2)    Time: 1594-7076 PT Time Calculation (min) (ACUTE ONLY): 24 min   Charges:   PT Evaluation $PT Eval Moderate Complexity: 1 Mod PT Treatments $Therapeutic Activity: 8-22 mins        Lyanne Co, PT  Acute Rehab Services  Pager [phone] Office [phone]   Lawana Chambers Shaya Altamura 07/27/2019, 2:05 PM

## 2019-07-27 NOTE — Progress Notes (Signed)
Progress Note    Denise Santiago  JOI:[id number] DOB: [birth year]  DOA: 07/26/2019 PCP: Renford Dills, MD    Brief Narrative:   Chief complaint: Weakness/fall.  Medical records reviewed and are as summarized below:  Denise Santiago is an 64 y.o. female past medical history that includes CVA, diabetes, hypertension, tobacco use resented to the emergency department after she had "slid out of her chair" and was unable to get up off the floor.  Work-up in the emergency department revealed hypokalemia, hyponatremia, acute on chronic kidney failure, elevated troponin, lactic acidosis.  Was provided with IV fluids as well as potassium.  Assessment/Plan:   Principal Problem:   Generalized weakness Active Problems:   Hypokalemia   Hyponatremia   Acute on chronic kidney failure (HCC)   Elevated troponin   Lactic acidosis   Essential hypertension   Type 2 diabetes mellitus without complication (HCC)   CVA (cerebral vascular accident) (HCC)   Hyperlipidemia   Thrombocytopenia (HCC)   #1.  Generalized weakness/fall.  Etiology unclear.  She came in with electrolyte abnormalities laded to decreased oral intake while she was laying on the floor but this does not explain why she slid onto the floor in the first place.  She is provided with IV fluids and electrolyte levels are improving.  No chest pain.  No signs of infectious process.  CT of the head shows an old right posterior parietal infarct no acute intracranial abnormalities.  Evaluated by PT who recommends CIR who evaluates and opines patient does not have the medical complexity to warrant an inpatient rehab stay. -Continue gentle IV fluids with potassium -Mobilize -We will likely need home health  #2.  Hypokalemia, hyponatremia, lactic acidosis.  Likely related to decreased oral intake after laying on the floor overnight.  She is received potassium supplements as well as IV fluids.  Potassium level 3.3 sodium level 134 lactic acid  level within the limits of normal. -Provide p.o. potassium supplement -Continue gentle IV fluids -Recheck in the morning  #3.  Elevated troponin/elevated CK.  Likely related to above.  Denies chest pain.  CK level trending down this morning.  Evaluated by cardiology who recommend repeating EKG and if no changes outpatient stress test. -Telemetry -Continue gentle IV fluids  #4.  Acute kidney failure superimposed on chronic kidney disease stage II.  Creatinine 1.38 on admission.  This morning creatinine trending down to 1.17. -Hold nephrotoxins -Continue gentle IV fluids -Monitor urine output -Recheck in the morning  #5.  Hypertension.  Controlled.  Home medications include hydrochlorothiazide, lisinopril -Holding lisinopril and hydrochlorothiazide -Monitor closely -Resume home meds as indicated  #6.  Diabetes.  A1c 6.9.  Controlled.  CBG range 92-136. -Sliding scale insulin for optimal control  #7.  CVA/hyperlipidemia.  Home meds include Plavix and statin.  -Continue home meds -PT recommendations noted above  #8.  Thrombocytopenia.  Platelets are 97.  Chart review indicates she has a history of chronic thrombocytopenia no sign symptoms of bleeding -Monitor   Family Communication/Anticipated D/C date and plan/Code Status   DVT prophylaxis: Lovenox ordered. Code Status: Full Code.  Family Communication: message for sister Disposition Plan: Discharge home with home health likely tomorrow after rehydrated and metabolic derangements resolved and acute kidney injury resolved   Medical Consultants:    None.   Anti-Infectives:    None  Subjective:   Awake alert.  Denies pain or discomfort.  Objective:    Vitals:   07/27/19 0354 07/27/19 0450 07/27/19 1583 07/27/19 1155  BP: 120/62  115/72 135/71  Pulse: 70  68 70  Resp: 18  20 16   Temp: 98.5 F (36.9 C)  97.9 F (36.6 C) 98.2 F (36.8 C)  TempSrc: Oral  Oral Oral  SpO2: 100%  100% 97%  Weight:  83.1 kg       Intake/Output Summary (Last 24 hours) at 07/27/2019 1444 Last data filed at 07/27/2019 1200 Gross per 24 hour  Intake 1257.53 ml  Output 375 ml  Net 882.53 ml   Filed Weights   07/27/19 0450  Weight: 83.1 kg    Exam: General: Awake alert well-nourished no acute distress CV: Regular rate and rhythm no murmur gallop or rub no lower extremity edema Respiratory: No increased work of breathing breath sounds clear bilaterally to auscultation. Abdomen: Nondistended soft positive bowel sounds throughout nontender to palpation no guarding or rebounding Neuro: Awake alert oriented x3 speech clear facial symmetry bilateral grip 5 out of 5 Musculoskeletal: Joints without swelling/erythema  Data Reviewed:   I have personally reviewed following labs and imaging studies:  Labs: Labs show the following:   Basic Metabolic Panel: Recent Labs  Lab 07/26/19 0810 07/26/19 1709 07/27/19 0410  NA 134*  --  137  K 2.9*  --  3.3*  CL 94*  --  99  CO2 27  --  26  GLUCOSE 136*  --  127*  BUN 28*  --  20  CREATININE 1.38* 1.04* 1.17*  CALCIUM 9.5  --  9.1  MG  --  2.7*  --    GFR Estimated Creatinine Clearance: 48.5 mL/min (A) (by C-G formula based on SCr of 1.17 mg/dL (H)). Liver Function Tests: Recent Labs  Lab 07/26/19 0810  AST 40  ALT 27  ALKPHOS 64  BILITOT 1.4*  PROT 7.7  ALBUMIN 3.5   Recent Labs  Lab 07/26/19 0810  LIPASE 24   No results for input(s): AMMONIA in the last 168 hours. Coagulation profile No results for input(s): INR, PROTIME in the last 168 hours.  CBC: Recent Labs  Lab 07/26/19 0810 07/26/19 1709 07/27/19 0410  WBC 4.7 5.4 4.7  HGB 15.0 16.2* 14.4  HCT 44.3 46.7* 42.0  MCV 88.8 88.4 89.0  PLT 101* 88* 97*   Cardiac Enzymes: Recent Labs  Lab 07/26/19 1709 07/27/19 0410  CKTOTAL 429* 223  CKMB 22.3*  --    BNP (last 3 results) No results for input(s): PROBNP in the last 8760 hours. CBG: Recent Labs  Lab 07/26/19 1135  07/26/19 1757 07/26/19 2159 07/27/19 0623 07/27/19 1152  GLUCAP 136* 143* 91 136* 92   D-Dimer: No results for input(s): DDIMER in the last 72 hours. Hgb A1c: Recent Labs    07/26/19 1709  HGBA1C 6.9*   Lipid Profile: No results for input(s): CHOL, HDL, LDLCALC, TRIG, CHOLHDL, LDLDIRECT in the last 72 hours. Thyroid function studies: No results for input(s): TSH, T4TOTAL, T3FREE, THYROIDAB in the last 72 hours.  Invalid input(s): FREET3 Anemia work up: No results for input(s): VITAMINB12, FOLATE, FERRITIN, TIBC, IRON, RETICCTPCT in the last 72 hours. Sepsis Labs: Recent Labs  Lab 07/26/19 0810 07/26/19 1104 07/26/19 1709 07/26/19 1843 07/26/19 2156 07/27/19 0410  WBC 4.7  --  5.4  --   --  4.7  LATICACIDVEN  --  3.0*  --  1.8 1.8  --     Microbiology Recent Results (from the past 240 hour(s))  Culture, blood (Routine X 2) w Reflex to ID Panel     Status:  None (Preliminary result)   Collection Time: 07/26/19 11:04 AM   Specimen: BLOOD LEFT HAND  Result Value Ref Range Status   Specimen Description BLOOD LEFT HAND  Final   Special Requests   Final    BOTTLES DRAWN AEROBIC AND ANAEROBIC Blood Culture adequate volume   Culture   Final    NO GROWTH 1 DAY Performed at [facility], [address], [place], [place] [address]    Report Status PENDING  Incomplete  SARS CORONAVIRUS 2 (TAT 6-24 HRS) Nasopharyngeal Nasopharyngeal Swab     Status: None   Collection Time: 07/26/19 12:44 PM   Specimen: Nasopharyngeal Swab  Result Value Ref Range Status   SARS Coronavirus 2 NEGATIVE NEGATIVE Final    Comment: (NOTE) SARS-CoV-2 target nucleic acids are NOT DETECTED. The SARS-CoV-2 RNA is generally detectable in upper and lower respiratory specimens during the acute phase of infection. Negative results do not preclude SARS-CoV-2 infection, do not rule out co-infections with other pathogens, and should not be used as the sole basis for treatment or other patient  management decisions. Negative results must be combined with clinical observations, patient history, and epidemiological information. The expected result is Negative. Fact Sheet for Patients: HairSlick.no Fact Sheet for Healthcare Providers: quierodirigir.com This test is not yet approved or cleared by the [place] FDA and  has been authorized for detection and/or diagnosis of SARS-CoV-2 by FDA under an Emergency Use Authorization (EUA). This EUA will remain  in effect (meaning this test can be used) for the duration of the COVID-19 declaration under Section 56 4(b)(1) of the Act, 21 U.S.C. section 360bbb-3(b)(1), unless the authorization is terminated or revoked sooner. Performed at [facility], [address] [place], [place] [address]   Culture, blood (Routine X 2) w Reflex to ID Panel     Status: None (Preliminary result)   Collection Time: 07/26/19  5:09 PM   Specimen: BLOOD LEFT HAND  Result Value Ref Range Status   Specimen Description BLOOD LEFT HAND  Final   Special Requests   Final    BOTTLES DRAWN AEROBIC ONLY Blood Culture results may not be optimal due to an inadequate volume of blood received in culture bottles   Culture   Final    NO GROWTH < 24 HOURS Performed at [facility], [address] [place], [place] [address]    Report Status PENDING  Incomplete    Procedures and diagnostic studies:  CT Head Wo Contrast  Result Date: 07/26/2019 CLINICAL DATA:  Altered mental status. EXAM: CT HEAD WITHOUT CONTRAST TECHNIQUE: Contiguous axial images were obtained from the base of the skull through the vertex without intravenous contrast. COMPARISON:  June 14, 2019. March 18, 2019. FINDINGS: Brain: Right posterior parietal encephalomalacia is noted consistent old infarction. No mass effect or midline shift is noted. Ventricular size is within normal limits. There is no evidence of mass lesion,  hemorrhage or acute infarction. Vascular: No hyperdense vessel or unexpected calcification. Skull: Normal. Negative for fracture or focal lesion. Sinuses/Orbits: No acute finding. Other: None. IMPRESSION: Old right posterior parietal infarction. No acute intracranial abnormality seen. Electronically Signed   By: Lupita Raider M.D.   On: 07/26/2019 10:43   DG Chest Port 1 View  Result Date: 07/26/2019 CLINICAL DATA:  Weakness. EXAM: PORTABLE CHEST 1 VIEW COMPARISON:  Chest radiograph 03/18/2019 FINDINGS: Heart size within normal limits. Aortic atherosclerosis a loop recorder device projects in the region of the left lower chest.  No evidence of airspace consolidation within the lungs. No evidence of pleural effusion or pneumothorax. No acute bony abnormality is identified. IMPRESSION: No evidence of acute cardiopulmonary abnormality. Aortic atherosclerosis. Implantable loop recorder device. Electronically Signed   By: Kellie Simmering DO   On: 07/26/2019 09:06    Medications:   . atorvastatin  80 mg Oral Daily  . citalopram  10 mg Oral Daily  . clopidogrel  75 mg Oral Daily  . insulin aspart  0-15 Units Subcutaneous TID WC  . insulin aspart  0-5 Units Subcutaneous QHS  . latanoprost  1 drop Both Eyes QHS  . nicotine  21 mg Transdermal Daily  . sodium chloride flush  3 mL Intravenous Once   Continuous Infusions: . 0.9 % NaCl with KCl 40 mEq / L 75 mL/hr (07/27/19 0814)     LOS: 0 days   Radene Gunning NP [facility]   How to contact the [facility] Attending or Consulting provider [place] or covering provider during after hours [place], for this patient?  1. Check the care team in [facility] and look for a) attending/consulting TR[facility] provider listed and b) the [facility] team listed 2. Log into [email or web address] and use [place]'s universal password to access. If you do not have the password, please contact the hospital operator. 3. Locate the [facility] provider you are looking for under [facility] and page  to a number that you can be directly reached. 4. If you still have difficulty reaching the provider, please page the [facility] (Director on Call) for the Hospitalists listed on amion for assistance.  07/27/2019, 2:44 PM

## 2019-07-28 LAB — GLUCOSE, CAPILLARY
Glucose-Capillary: 158 mg/dL — ABNORMAL HIGH (ref 70–99)
Glucose-Capillary: 79 mg/dL (ref 70–99)
Glucose-Capillary: 137 mg/dL — ABNORMAL HIGH (ref 70–99)
Glucose-Capillary: 86 mg/dL (ref 70–99)
Glucose-Capillary: 97 mg/dL (ref 70–99)

## 2019-07-28 LAB — BASIC METABOLIC PANEL
Anion gap: 12 (ref 5–15)
BUN: 17 mg/dL (ref 8–23)
CO2: 20 mmol/L — ABNORMAL LOW (ref 22–32)
Calcium: 8.9 mg/dL (ref 8.9–10.3)
Chloride: 107 mmol/L (ref 98–111)
Creatinine, Ser: 1.23 mg/dL — ABNORMAL HIGH (ref 0.44–1.00)
GFR calc Af Amer: 54 mL/min — ABNORMAL LOW (ref >60)
GFR calc non Af Amer: 46 mL/min — ABNORMAL LOW (ref >60)
Glucose, Bld: 102 mg/dL — ABNORMAL HIGH (ref 70–99)
Potassium: 4.1 mmol/L (ref 3.5–5.1)
Sodium: 139 mmol/L (ref 135–145)

## 2019-07-28 LAB — CBC
HCT: 40.2 % (ref 36.0–46.0)
Hemoglobin: 13.9 g/dL (ref 12.0–15.0)
MCH: 30.7 pg (ref 26.0–34.0)
MCHC: 34.6 g/dL (ref 30.0–36.0)
MCV: 88.7 fL (ref 80.0–100.0)
Platelets: 97 10*3/uL — ABNORMAL LOW (ref 150–400)
RBC: 4.53 MIL/uL (ref 3.87–5.11)
RDW: 13 % (ref 11.5–15.5)
WBC: 5.7 10*3/uL (ref 4.0–10.5)
nRBC: 0 % (ref 0.0–0.2)

## 2019-07-28 NOTE — TOC Initial Note (Signed)
Transition of Care [facility]) - Initial/Assessment Note    Patient Details  Name: Denise Santiago MRN: [id number] Date of Birth: 1955-11-30  Transition of [facility] CM/SW Contact:    Geralynn Ochs, LCSW Phone Number: 07/28/2019, 12:03 PM  Clinical Narrative:    CSW met with patient to discuss recommendation for SNF placement. Patient in agreement, says she can't go home the way she is right now and she needs rehab. CSW asked about insurance, and patient does not have any. CSW explained process of evaluating for Medicaid, and how that has to be done first as patient does not have a payor source. Patient in agreement. CSW said that patient's sister was also asking to speak with CSW, and patient provided permission for CSW to call. CSW left sister a voicemail to discuss.   CSW sent email to financial counselor to ask them to review patient's case to see if she qualifies for Medicaid. CSW to follow up with SNF referral if patient qualifies for Medicaid.                  Expected Discharge Plan: Skilled Nursing Facility Barriers to Discharge: Continued Medical Work up, Inadequate or no insurance, SNF Pending payor source - LOG, SNF Pending Medicaid, SNF Pending bed offer   Patient Goals and CMS Choice Patient states their goals for this hospitalization and ongoing recovery are:: to get rehab CMS Medicare.gov Compare Post Acute Care list provided to:: Patient Choice offered to / list presented to : Patient  Expected Discharge Plan and Services Expected Discharge Plan: [place] Choice: [place] Living arrangements for the past 2 months: Apartment                                      Prior Living Arrangements/Services Living arrangements for the past 2 months: Apartment Lives with:: Self Patient language and need for interpreter reviewed:: No Do you feel safe going back to the place where you live?: Yes      Need for  Family Participation in Patient Care: Yes (Comment) Care giver support system in place?: No (comment)   Criminal Activity/Legal Involvement Pertinent to Current Situation/Hospitalization: No - Comment as needed  Activities of Daily Living      Permission Sought/Granted Permission sought to share information with : Facility Sport and exercise psychologist, Family Supports Permission granted to share information with : Yes, Verbal Permission Granted  Share Information with NAME: [name]  Permission granted to share info w AGENCY: SNF  Permission granted to share info w Relationship: Sister     Emotional Assessment Appearance:: Appears stated age Attitude/Demeanor/Rapport: Engaged Affect (typically observed): Pleasant Orientation: : Oriented to Self, Oriented to Place, Oriented to  Time, Oriented to Situation Alcohol / Substance Use: Not Applicable Psych Involvement: No (comment)  Admission diagnosis:  Hypokalemia [E87.6] Elevated troponin [R77.8] Generalized weakness [R53.1] AKI (acute kidney injury) ([place]) [N17.9] Patient Active Problem List   Diagnosis Date Noted  . AKI (acute kidney injury) ([place]) 07/27/2019  . Hyperlipidemia   . Generalized weakness   . Hypokalemia   . Hyponatremia   . Acute on chronic kidney failure ([place])   . Thrombocytopenia ([place])   . Elevated troponin   . Lactic acidosis   . Essential hypertension 03/19/2019  . Type 2 diabetes mellitus without complication ([place]) 52/84/1324  . Left hemiparesis ([place]) 03/19/2019  . CVA (cerebral  vascular accident) ([place]) 03/19/2019  . Right hemiparesis ([place]) 03/19/2019  . Acute ischemic right MCA stroke ([place]) 12/10/2018   PCP:  Seward Carol, MD Pharmacy:   [facility] [address] [place] [address] [place] [address] Phone: [phone] Fax: [phone]     Social Determinants of Health (SDOH) Interventions    Readmission Risk Interventions No flowsheet  data found.

## 2019-07-28 NOTE — Progress Notes (Addendum)
PROGRESS NOTE  Denise Santiago FTD:[id number] DOB: 13-Aug-1955 DOA: 07/26/2019 PCP: Renford Dills, MD  Brief History   64 year old woman presented to the emergency department after feeling weak and lethargic at home, she has slid out of her chair and was not able to get up off the floor secondary to generalized weakness.  She laid on the floor all night long.  Admitted for generalized weakness, thought secondary to electrolyte abnormalities and poor oral intake, complicated by diuretic, elevated troponin.  Patient seen by cardiology for elevated troponin.  No evidence of ischemia and therefore echocardiogram was not recommended.  CK-MB was also noted to be elevated, troponin nonspecific in the setting.  Consultants  . Cardiology  Procedures  . None  Antibiotics   Anti-infectives (From admission, onward)   None    .  Subjective  The patient is resting comfortably. The patient is having visual hallucinations that two family members are at bedside. No new complaints.  Objective   Vitals:  Vitals:   07/28/19 0859 07/28/19 1224  BP: 123/75 135/69  Pulse: 74 82  Resp: 16 20  Temp: 97.7 F (36.5 C) 97.6 F (36.4 C)  SpO2: 99% 95%   Exam:  Constitutional:  . The patient is awake and alert. She is having visual hallucinations. Respiratory:  . No increased work of breathing. . No wheezes, rales, or rhonchi . No tactile fremitus Cardiovascular:  . Regular rate and rhythm . No murmurs, ectopy, or gallups. . No lateral PMI. No thrills. Abdomen:  . Abdomen is soft, non-tender, non-distended . No hernias, masses, or organomegaly . Normoactive bowel sounds.  Musculoskeletal:  . No cyanosis, clubbing, or edema Skin:  . No rashes, lesions, ulcers . palpation of skin: no induration or nodules Neurologic:  . CN 2-12 intact . Sensation all 4 extremities intact Psychiatric:  . Mental status o Mood, affect appropriate o Orientation to person, place, time  . judgment and  insight appear intact I have personally reviewed the following:   Today's Data  . Vitals, BMP, CBC  Micro Data  . Urine culture has grown out e. Coli.  . Blood Cultures x 2: no growth.  Imaging  . CT head: no acute intracranial abnormalities .   Cardiology Data  . EKG . Troponin   Scheduled Meds: . atorvastatin  80 mg Oral Daily  . citalopram  10 mg Oral Daily  . clopidogrel  75 mg Oral Daily  . insulin aspart  0-15 Units Subcutaneous TID WC  . insulin aspart  0-5 Units Subcutaneous QHS  . latanoprost  1 drop Both Eyes QHS  . nicotine  21 mg Transdermal Daily  . sodium chloride flush  3 mL Intravenous Once   Continuous Infusions: . 0.9 % NaCl with KCl 40 mEq / L 50 mL/hr (07/28/19 0014)    Principal Problem:   Generalized weakness Active Problems:   Essential hypertension   Type 2 diabetes mellitus without complication (HCC)   CVA (cerebral vascular accident) (HCC)   Hyperlipidemia   Hypokalemia   Hyponatremia   Acute on chronic kidney failure (HCC)   Thrombocytopenia (HCC)   Elevated troponin   Lactic acidosis   AKI (acute kidney injury) (HCC)   LOS: 1 day   A & P   Generalized weakness/fall.  Etiology unclear.  She came in with electrolyte abnormalities laded to decreased oral intake while she was laying on the floor but this does not explain why she slid onto the floor in the first place.  She  is provided with IV fluids and electrolyte levels are improving.  No chest pain.  No signs of infectious process.  CT of the head shows an old right posterior parietal infarct no acute intracranial abnormalities.  Evaluated by PT who recommends CIR who evaluates and opines patient does not have the medical complexity to warrant an inpatient rehab stay. She has been evaluated by PT/OT. Their recommendation is for CIR.   Hypokalemia: Improved. Supplement and monitor.  Hyponatremia: Resolved. Likely due to decreased oral intake after laying on the floor overnight.    Lactic acidosis: Resolved.   Elevated troponin/elevated CK: The patient has been evaluated in consult by cardiology. They feel that the elevated troponins and EKG changes were due to the fall and poor PO intake. They may consider the patient for outpatient stress test.   Acute kidney failure superimposed on chronic kidney disease stage II.  Creatinine 1.38 on admission.  This morning creatinine trending down to 1.17. Avoid nephrotoxins and hypotension. Monitor creatinine, electrolytes, and volume status.   Hypertension. The patient is currently normotensive. Lisinopril and HCTZ have been held due to AKI. Monitor.  Diabetes:  A1c 6.9.  Controlled.  CBG range 79-282. Sugars will be followed with FSBS and SSI.  CVA/hyperlipidemia.  Home meds include Plavix and statin. Continue.  Thrombocytopenia.  Platelets are 97.  Chart review indicates she has a history of chronic thrombocytopenia no sign symptoms of bleeding. Monitor.  Visual hallucinations: Likely due to midications. Will stop celexa. Monitor.  I have seen and examined this patient myself. No new complaints.   Family Communication/Anticipated D/C date and plan/Code Status   DVT prophylaxis: Lovenox ordered. Code Status: Full Code.  Family Communication: message for sister Disposition Plan: The patient is from home. Discharge is likely to CIR which is the recommendation of PT/OT. Barrier to discharge is clinical improvement particularly with regard to mental status and hallucinations.   Jeanpierre Thebeau, DO [facility] Direct contact: see [email or web address]  7PM-7AM contact night coverage as above 07/28/2019, 4:21 PM  LOS: 1 day

## 2019-07-28 NOTE — Progress Notes (Signed)
Physical Therapy Treatment Patient Details Name: Denise Santiago MRN: [id number] DOB: 10-Apr-1956 Today's Date: 07/28/2019    History of Present Illness 64 y.o. female with a hx of DM, HTN, HLD, CVA 12/2018 & 03/2019, ongoing tob use who presented to ED with generalized weakness and elevated troponin, thought to be non specific by cardiology. pt reports weakness after COVID vaccine 07/16/19. The day prior to admission pt slid out of chair to floor and remained through the night.     PT Comments    Pt seated in recliner leaning forward.  Pt required re-orientation to place and situation.  Pt continues to benefit from skilled rehab in a post acute setting.  She was able to progress gt with close chair follow this session.     Follow Up Recommendations  CIR;Supervision/Assistance - 24 hour     Equipment Recommendations  None recommended by PT    Recommendations for Other Services Rehab consult;OT consult     Precautions / Restrictions Precautions Precautions: Fall Precaution Comments: reports falls since COVID vaccine Restrictions Weight Bearing Restrictions: Yes    Mobility  Bed Mobility               General bed mobility comments: Pt seated in recliner chair.  Transfers Overall transfer level: Needs assistance Equipment used: Rolling walker (2 wheeled) Transfers: Sit to/from Stand Sit to Stand: Mod assist;+2 physical assistance         General transfer comment: Cues for hand placement to and from seated surface.  Ambulation/Gait Ambulation/Gait assistance: Mod assist;+2 safety/equipment Gait Distance (Feet): 10 Feet Assistive device: Rolling walker (2 wheeled) Gait Pattern/deviations: Step-to pattern;Trunk flexed;Decreased stride length     General Gait Details: Pt required assistance to maintain safe position in RW and for sequencing.  Poor balance noted and she fatigues quickly.   Stairs             Wheelchair Mobility    Modified Rankin  (Stroke Patients Only) Modified Rankin (Stroke Patients Only) Pre-Morbid Rankin Score: No significant disability Modified Rankin: Moderately severe disability     Balance Overall balance assessment: Needs assistance Sitting-balance support: Bilateral upper extremity supported;Feet supported Sitting balance-Leahy Scale: Poor       Standing balance-Leahy Scale: Poor                              Cognition Arousal/Alertness: Awake/alert Behavior During Therapy: Flat affect Overall Cognitive Status: No family/caregiver present to determine baseline cognitive functioning Area of Impairment: Safety/judgement;Awareness;Following commands                     Memory: Decreased short-term memory Following Commands: Follows one step commands with increased time;Follows one step commands inconsistently Safety/Judgement: Decreased awareness of safety;Decreased awareness of deficits   Problem Solving: Slow processing;Difficulty sequencing;Decreased initiation General Comments: Pt 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  Pt require step by step cues for safety during all mobility.      Exercises General Exercises - Lower Extremity Long Arc Quad: AROM;Both;10 reps;Supine Heel Slides: AAROM;Both;10 reps;Supine Hip ABduction/ADduction: AAROM;Both;10 reps;Supine    General Comments        Pertinent Vitals/Pain Pain Assessment: Faces Faces Pain Scale: Hurts worst Pain Location: L anterior thigh due to 3n1 pinch in commode Pain Descriptors / Indicators: Tender Pain Intervention(s): Monitored during session;Repositioned    Home Living  Prior Function            PT Goals (current goals can now be found in the care plan section) Acute Rehab PT Goals Patient Stated Goal: return to PLOF Potential to Achieve Goals: Good Progress towards PT goals: Progressing toward goals    Frequency    Min  4X/week      PT Plan Current plan remains appropriate    Co-evaluation              AM-PAC PT "6 Clicks" Mobility   Outcome Measure  Help needed turning from your back to your side while in a flat bed without using bedrails?: A Little Help needed moving from lying on your back to sitting on the side of a flat bed without using bedrails?: A Lot Help needed moving to and from a bed to a chair (including a wheelchair)?: A Lot Help needed standing up from a chair using your arms (e.g., wheelchair or bedside chair)?: A Lot Help needed to walk in hospital room?: Total Help needed climbing 3-5 steps with a railing? : Total 6 Click Score: 11    End of Session Equipment Utilized During Treatment: Gait belt Activity Tolerance: Patient limited by fatigue;Patient limited by lethargy Patient left: in bed;with call bell/phone within reach;with bed alarm set Nurse Communication: Mobility status PT Visit Diagnosis: Unsteadiness on feet (R26.81);Muscle weakness (generalized) (M62.81);Repeated falls (R29.6);Difficulty in walking, not elsewhere classified (R26.2)     Time: 0981-1914 PT Time Calculation (min) (ACUTE ONLY): 21 min  Charges:  $Gait Training: 8-22 mins                     Erasmo Leventhal , PTA Acute Rehabilitation Services Pager [phone] Office [phone]     Denise Santiago 07/28/2019, 2:44 PM

## 2019-07-28 NOTE — Evaluation (Signed)
Occupational Therapy Evaluation Patient Details Name: Denise Santiago MRN: [id number] DOB: 12-Jul-1955 Today's Date: 07/28/2019    History of Present Illness 64 y.o. female with a hx of DM, HTN, HLD, CVA 12/2018 & 03/2019, ongoing tobacco use who presented to ED with generalized weakness and elevated troponin, thought to be non specific by cardiology. pt reports weakness after COVID vaccine 07/16/19. The day prior to admission pt slid out of chair to floor and remained through the night.    Clinical Impression   PT admitted with elevated troponin and generalized weakness. Pt currently with functional limitiations due to the deficits listed below (see OT problem list). Pt requires total +2 Max (A) to complete basic transfer. 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  Pt will benefit from skilled OT to increase their independence and safety with adls and balance to allow discharge SNF.     Follow Up Recommendations  SNF    Equipment Recommendations  3 in 1 bedside commode;Wheelchair (measurements OT);Wheelchair cushion (measurements OT);Hospital bed    Recommendations for Other Services       Precautions / Restrictions Precautions Precautions: Fall Precaution Comments: reports falls since COVID vaccine Restrictions Weight Bearing Restrictions: Yes      Mobility Bed Mobility Overal bed mobility: Needs Assistance Bed Mobility: Supine to Sit     Supine to sit: Max assist     General bed mobility comments: pt requires max (A) to bring BIL LE toward EOB. max (A) to pivot with pad to eob sitting and min (A) to sustain eob sitting  Transfers Overall transfer level: Needs assistance Equipment used: Rolling walker (2 wheeled) Transfers: Sit to/from Stand Sit to Stand: +2 physical assistance;Max assist         General transfer comment: pt completed eob to bsc mod (A) with RW . pt with skin becoming pinched in bsc seat and requires sit<>stand to  release skin. pt requires total +2 max (A) to transfer L to chair. pt complete sit<>stand from chair to have RN assess skin with uncontrolled descend to chair total +2 total (A)     Balance Overall balance assessment: Needs assistance Sitting-balance support: Bilateral upper extremity supported;Feet supported Sitting balance-Leahy Scale: Poor     Standing balance support: Bilateral upper extremity supported;During functional activity Standing balance-Leahy Scale: Poor Standing balance comment: unable to stand without UE support                           ADL either performed or assessed with clinical judgement   ADL Overall ADL's : Needs assistance/impaired Eating/Feeding: Minimal assistance   Grooming: Moderate assistance   Upper Body Bathing: Moderate assistance   Lower Body Bathing: Total assistance   Upper Body Dressing : Moderate assistance   Lower Body Dressing: Total assistance   Toilet Transfer: +2 for physical assistance;Maximal assistance Toilet Transfer Details (indicate cue type and reason): stand pivot to bedside.  Toileting- Clothing Manipulation and Hygiene: Total assistance         General ADL Comments: pt is able to only progress to [facility] this session and then to chair due to endurance.     Vision Baseline Vision/History: Wears glasses       Perception     Praxis      Pertinent Vitals/Pain Pain Assessment: Faces Faces Pain Scale: Hurts worst Pain Location: L anterior thigh due to 3n1 pinch in commode Pain Descriptors / Indicators: Tender Pain Intervention(s): Monitored  during session;Repositioned     Hand Dominance Right   Extremity/Trunk Assessment Upper Extremity Assessment Upper Extremity Assessment: Generalized weakness LUE Deficits / Details: noted to grimace reaching with L UE toward RW. pt able to tolerate once hand placed on RW   Lower Extremity Assessment Lower Extremity Assessment: Generalized weakness;Defer to PT  evaluation   Cervical / Trunk Assessment Cervical / Trunk Assessment: Kyphotic   Communication Communication Communication: No difficulties   Cognition Arousal/Alertness: Awake/alert Behavior During Therapy: Flat affect Overall Cognitive Status: No family/caregiver present to determine baseline cognitive functioning Area of Impairment: Safety/judgement;Awareness;Following commands                       Following Commands: Follows one step commands with increased time;Follows one step commands inconsistently Safety/Judgement: Decreased awareness of safety;Decreased awareness of deficits   Problem Solving: Slow processing;Difficulty sequencing;Decreased initiation General Comments: pt asked to move legs toward EOB and pt just moves legs in a scissor fashion with no progress to EOB. Pt responds okay but not moving in response to understanding the command.     General Comments  wound on L buttock with dressing    Exercises     Shoulder Instructions      Home Living Family/patient expects to be discharged to:: Private residence Living Arrangements: Alone Available Help at Discharge: Family;Available PRN/intermittently Type of Home: House Home Access: Stairs to enter [employer] of Steps: 3 Entrance Stairs-Rails: Right Home Layout: One level     Bathroom Shower/Tub: Chief Strategy Officer: Standard     Home Equipment: Toilet riser;Cane - single point;Shower seat;Walker - 2 wheels   Additional Comments: sister and niece help periodically but no one can help her 24/7      Prior Functioning/Environment Level of Independence: Independent with assistive device(s)        Comments: sometimes uses cane, sometimes RW. Pt reports she still drives and works. Pt reports she sleeps in her recliner because it's difficult to get in and OOB and has fallen out several times. She was not trying to get up when she slid out.         OT Problem List:  Decreased strength;Decreased activity tolerance;Impaired balance (sitting and/or standing);Decreased safety awareness;Decreased cognition;Decreased knowledge of use of DME or AE;Decreased knowledge of precautions;Obesity;Cardiopulmonary status limiting activity;Pain      OT Treatment/Interventions: Self-care/ADL training;Therapeutic exercise;Neuromuscular education;Energy conservation;DME and/or AE instruction;Manual therapy;Modalities;Therapeutic activities;Patient/family education;Balance training;Visual/perceptual remediation/compensation;Cognitive remediation/compensation    OT Goals(Current goals can be found in the care plan section) Acute Rehab OT Goals Patient Stated Goal: return to PLOF OT Goal Formulation: Patient unable to participate in goal setting Time For Goal Achievement: 08/11/19 Potential to Achieve Goals: Good  OT Frequency: Min 2X/week   Barriers to D/C: Decreased caregiver support          Co-evaluation              AM-PAC OT "6 Clicks" Daily Activity     Outcome Measure Help from another person eating meals?: A Lot Help from another person taking care of personal grooming?: A Lot Help from another person toileting, which includes using toliet, bedpan, or urinal?: A Lot Help from another person bathing (including washing, rinsing, drying)?: A Lot Help from another person to put on and taking off regular upper body clothing?: A Lot Help from another person to put on and taking off regular lower body clothing?: Total 6 Click Score: 11   End of Session Equipment Utilized  During Treatment: Gait belt;Rolling walker Nurse Communication: Mobility status;Precautions  Activity Tolerance: Patient tolerated treatment well Patient left: in chair;with call bell/phone within reach;with chair alarm set;with nursing/sitter in room(tech in room giving bath in chair)  OT Visit Diagnosis: Unsteadiness on feet (R26.81);Muscle weakness (generalized) (M62.81)                 Time: 7939-0300 OT Time Calculation (min): 28 min Charges:  OT General Charges $OT Visit: 1 Visit OT Evaluation $OT Eval Moderate Complexity: 1 Mod   Brynn, OTR/L  Acute Rehabilitation Services Pager: [phone] Office: [phone] .   Mateo Flow 07/28/2019, 10:55 AM

## 2019-07-28 NOTE — Progress Notes (Addendum)
Progress Note  Patient Name: Denise Santiago Date of Encounter: 07/28/2019  Primary Cardiologist:   Donato Schultz, MD   Subjective   Oriented to name/place. Wants to go, thought her friend was here. Denies CP/SOB.  Inpatient Medications    Scheduled Meds: . atorvastatin  80 mg Oral Daily  . citalopram  10 mg Oral Daily  . clopidogrel  75 mg Oral Daily  . insulin aspart  0-15 Units Subcutaneous TID WC  . insulin aspart  0-5 Units Subcutaneous QHS  . latanoprost  1 drop Both Eyes QHS  . nicotine  21 mg Transdermal Daily  . sodium chloride flush  3 mL Intravenous Once   Continuous Infusions: . 0.9 % NaCl with KCl 40 mEq / L 50 mL/hr (07/28/19 0014)   PRN Meds: acetaminophen **OR** acetaminophen, ondansetron **OR** ondansetron (ZOFRAN) IV   Vital Signs    Vitals:   07/27/19 2334 07/28/19 0346 07/28/19 0500 07/28/19 0859  BP: 103/67 105/68  123/75  Pulse: 79 75  74  Resp: 18 18  16   Temp: 98.5 F (36.9 C) 98.1 F (36.7 C)  97.7 F (36.5 C)  TempSrc: Oral Oral  Oral  SpO2: 95% 98%  99%  Weight:   81.6 kg     Intake/Output Summary (Last 24 hours) at 07/28/2019 1218 Last data filed at 07/28/2019 0347 Gross per 24 hour  Intake 1774.35 ml  Output --  Net 1774.35 ml   Filed Weights   07/27/19 0450 07/28/19 0500  Weight: 83.1 kg 81.6 kg    Telemetry    SR - Personally Reviewed  ECG    NA - Personally Reviewed  Physical Exam   General: Well developed, well nourished, female in no acute distress Head: Eyes PERRLA, Head normocephalic and atraumatic, poor dentition Lungs: clear bilaterally to auscultation. Heart: HRRR, S1 S2, without rub or gallop. No murmur. upper extremity pulses are 2+ & equal. No JVD. Abdomen: Bowel sounds are present, abdomen soft and non-tender without masses or  hernias noted. Msk: weak strength and tone for age. Extremities: No clubbing, cyanosis or edema.    Skin:  No rashes or lesions noted. Neuro: Alert and oriented X 2. Psych:   Good affect, responds appropriately   Labs    Chemistry Recent Labs  Lab 07/26/19 0810 07/26/19 0810 07/26/19 1709 07/27/19 0410 07/28/19 0358  NA 134*  --   --  137 139  K 2.9*  --   --  3.3* 4.1  CL 94*  --   --  99 107  CO2 27  --   --  26 20*  GLUCOSE 136*  --   --  127* 102*  BUN 28*  --   --  20 17  CREATININE 1.38*   < > 1.04* 1.17* 1.23*  CALCIUM 9.5  --   --  9.1 8.9  PROT 7.7  --   --   --   --   ALBUMIN 3.5  --   --   --   --   AST 40  --   --   --   --   ALT 27  --   --   --   --   ALKPHOS 64  --   --   --   --   BILITOT 1.4*  --   --   --   --   GFRNONAA 40*   < > 57* 49* 46*  GFRAA 47*   < > >60 57* 54*  ANIONGAP 13  --   --  12 12   < > = values in this interval not displayed.     Hematology Recent Labs  Lab 07/26/19 1709 07/27/19 0410 07/28/19 0358  WBC 5.4 4.7 5.7  RBC 5.28* 4.72 4.53  HGB 16.2* 14.4 13.9  HCT 46.7* 42.0 40.2  MCV 88.4 89.0 88.7  MCH 30.7 30.5 30.7  MCHC 34.7 34.3 34.6  RDW 12.6 12.6 13.0  PLT 88* 97* 97*    Magnesium  Date Value Ref Range Status  07/26/2019 2.7 (H) 1.7 - 2.4 mg/dL Final    Comment:    Performed at [facility], [address], [place], [place] [address]  03/19/2019 2.1 1.7 - 2.4 mg/dL Final    Comment:    Performed at [facility], [address] [place], [place] [address]     Cardiac Enzymes High Sensitivity Troponin:   Recent Labs  Lab 07/26/19 0810 07/26/19 1104  TROPONINIHS 66* 76*     Lab Results  Component Value Date   CKTOTAL 223 07/27/2019   CKMB 22.3 (H) 07/26/2019     BNP Recent Labs  Lab 07/26/19 0810  BNP 64.2     DDimer No results for input(s): DDIMER in the last 168 hours.   Radiology    No results found.  Cardiac Studies   ECHO:  1. Left ventricular ejection fraction, by visual estimation, is 60 to  65%. The left ventricle has normal function. There is no left ventricular  hypertrophy.  2. Left ventricular diastolic parameters are consistent  with Grade I  diastolic dysfunction (impaired relaxation).  3. The left ventricle has no regional wall motion abnormalities.  4. Global right ventricle has normal systolic function.The right  ventricular size is normal. No increase in right ventricular wall  thickness.  5. Left atrial size was normal.  6. Right atrial size was normal.  7. Presence of pericardial fat pad.  8. Trivial pericardial effusion is present.  9. The mitral valve is grossly normal. No evidence of mitral valve  regurgitation.  10. The tricuspid valve is grossly normal. Tricuspid valve regurgitation  is not demonstrated.  11. The aortic valve is grossly normal. Aortic valve regurgitation is not  visualized. No evidence of aortic valve sclerosis or stenosis.  12. The pulmonic valve was grossly normal. Pulmonic valve regurgitation is  not visualized.  13. TR signal is inadequate for assessing pulmonary artery systolic  pressure.  14. The inferior vena cava is normal in size with greater than 50%  respiratory variability, suggesting right atrial pressure of 3 mmHg.   Patient Profile     64 y.o. female   with a hx of DM, HTN, HLD, CVA 12/2018 & 03/2019, ongoing tob use, who is being seen for the evaluation of elevated troponin at the request of Dr. Deretha Emory.  Assessment & Plan    ELEVATED TROPOININ:  - in the setting of pt having fallen, w/ elevated CKMB also - ECG not acute, no reports of ischemic sx - discuss f/u and ouTpt testing w/ MD  PROLONGED QT: - QT/QTc 460/489 on 03/22 ECG on 03/21 was 484/511, was 462/481 on 03/2019 ECG - no hx low Mg, K+ supplemented  For questions or updates, please contact [facility] Please consult [email or web address] for contact info under Cardiology/STEMI.   Signed, Theodore Demark, PA-C  07/28/2019, 12:18 PM    History and all data above reviewed.  Patient examined.  I agree with the findings as  above.  The patient has no chest pain .  No SOB.  The patient exam  reveals COR:RRR  ,  Lungs:  Clear  ,  Abd: Positive bowel sounds, no rebound no guarding, Ext No edema  .  All available labs, radiology testing, previous records reviewed. Agree with documented assessment and plan.   Elevated troponin:  No in patient testing is planned.  We will follow as an out patient and consider non invasive testing.  Trop likely related to stress, rhabdo from being on the floor all night.  QT prolonged:   No symptoms.  No syncope.  Needs to avoid all QT prolonging drugs.   Jeneen Rinks Demetrio Leighty  5:05 PM  07/28/2019

## 2019-07-29 LAB — BASIC METABOLIC PANEL
Anion gap: 10 (ref 5–15)
BUN: 14 mg/dL (ref 8–23)
CO2: 20 mmol/L — ABNORMAL LOW (ref 22–32)
Calcium: 8.9 mg/dL (ref 8.9–10.3)
Chloride: 107 mmol/L (ref 98–111)
Creatinine, Ser: 1.11 mg/dL — ABNORMAL HIGH (ref 0.44–1.00)
GFR calc Af Amer: >60 mL/min (ref >60)
GFR calc non Af Amer: 52 mL/min — ABNORMAL LOW (ref >60)
Glucose, Bld: 104 mg/dL — ABNORMAL HIGH (ref 70–99)
Potassium: 4.6 mmol/L (ref 3.5–5.1)
Sodium: 137 mmol/L (ref 135–145)

## 2019-07-29 LAB — CBC WITH DIFFERENTIAL/PLATELET
Abs Immature Granulocytes: 0.01 10*3/uL (ref 0.00–0.07)
Basophils Absolute: 0 10*3/uL (ref 0.0–0.1)
Basophils Relative: 1 %
Eosinophils Absolute: 0 10*3/uL (ref 0.0–0.5)
Eosinophils Relative: 0 %
HCT: 41.5 % (ref 36.0–46.0)
Hemoglobin: 14.1 g/dL (ref 12.0–15.0)
Immature Granulocytes: 0 %
Lymphocytes Relative: 19 %
Lymphs Abs: 0.9 10*3/uL (ref 0.7–4.0)
MCH: 30.3 pg (ref 26.0–34.0)
MCHC: 34 g/dL (ref 30.0–36.0)
MCV: 89.1 fL (ref 80.0–100.0)
Monocytes Absolute: 0.5 10*3/uL (ref 0.1–1.0)
Monocytes Relative: 10 %
Neutro Abs: 3.4 10*3/uL (ref 1.7–7.7)
Neutrophils Relative %: 70 %
Platelets: 110 10*3/uL — ABNORMAL LOW (ref 150–400)
RBC: 4.66 MIL/uL (ref 3.87–5.11)
RDW: 13.3 % (ref 11.5–15.5)
WBC: 4.8 10*3/uL (ref 4.0–10.5)
nRBC: 0 % (ref 0.0–0.2)

## 2019-07-29 LAB — GLUCOSE, CAPILLARY
Glucose-Capillary: 107 mg/dL — ABNORMAL HIGH (ref 70–99)
Glucose-Capillary: 108 mg/dL — ABNORMAL HIGH (ref 70–99)
Glucose-Capillary: 113 mg/dL — ABNORMAL HIGH (ref 70–99)
Glucose-Capillary: 100 mg/dL — ABNORMAL HIGH (ref 70–99)
Glucose-Capillary: 124 mg/dL — ABNORMAL HIGH (ref 70–99)

## 2019-07-29 LAB — URINE CULTURE: Culture: 80000 — AB

## 2019-07-29 MED ORDER — SODIUM CHLORIDE 0.9 % IV SOLN
1.0000 g | INTRAVENOUS | Status: DC
  Administered 2019-07-29 – 2019-07-31 (×3): 1 g via INTRAVENOUS
  Filled 2019-07-29 (×3): qty 10

## 2019-07-29 MED ORDER — SODIUM CHLORIDE 0.9 % IV SOLN: 1.0000 g | INTRAVENOUS | Status: DC

## 2019-07-29 NOTE — Progress Notes (Signed)
Inpatient Rehab Admissions:  Inpatient Rehab Consult received.  I met with pt at the bedside for rehabilitation assessment. Pt is much more oriented and alert this afternoon compared to notes from previous daily sessions. Niece was also at the bedside for assessment/conversation. We discussed that the patient is being recommended for post acute rehab. AC discussed IP Rehab program details and expectations. Pt's niece relayed concerns about pt returning home as she was needing more help at home even before being admitted to the hospital. She does not have sufficient caregiver assistance to support an IP Rehab stay. The niece is in favor of SNF and the pt is in agreement for SNF as well. TOC team notified.   AC will sign off.   Raechel Ache, OTR/L  Rehab Admissions Coordinator  [phone] 07/29/2019 3:09 PM

## 2019-07-29 NOTE — Progress Notes (Addendum)
Physical Therapy Treatment Patient Details Name: Denise Santiago MRN: [id number] DOB: November 25, 1955 Today's Date: 07/29/2019    History of Present Illness 64 y.o. female with a hx of DM, HTN, HLD, CVA 12/2018 & 03/2019, ongoing tob use who presented to ED with generalized weakness and elevated troponin, thought to be non specific by cardiology. pt reports weakness after COVID vaccine 07/16/19. The day prior to admission pt slid out of chair to floor and remained through the night.     PT Comments    Pt seated in recliner she is oriented today but significantly weaker than previous session.  Pt is progressing at a slower pace and will update recommendation to SNF at this time.  Plan for progression of gt next session.     Follow Up Recommendations  Supervision/Assistance - 24 hour;SNF     Equipment Recommendations  None recommended by PT    Recommendations for Other Services       Precautions / Restrictions Precautions Precautions: Fall Precaution Comments: reports falls since COVID vaccine Restrictions Weight Bearing Restrictions: No    Mobility  Bed Mobility Overal bed mobility: Needs Assistance Bed Mobility: Supine to Sit;Sit to Supine       Sit to supine: Mod assist;+2 for physical assistance   General bed mobility comments: Assistance to Lift B LEs and lower trunk back to bed.  Transfers Overall transfer level: Needs assistance Equipment used: Rolling walker (2 wheeled) Transfers: Sit to/from [facility] Sit to Stand: +2 physical assistance;Max assist Stand pivot transfers: Max assist;+2 physical assistance       General transfer comment: Cues for hand placement to and from seated Pt with posterior bias and required assistance boost into standing.  pt unable to maintain standing without max +2.  Ambulation/Gait Ambulation/Gait assistance: Max assist;+2 physical assistance Gait Distance (Feet): 4 Feet(x2) Assistive device: Rolling walker (2  wheeled) Gait Pattern/deviations: Step-to pattern;Trunk flexed;Decreased stride length     General Gait Details: Pt required max +2 to move from recliner back to bed.  Pt with posterior bias and poor safety with RW.   Stairs             Wheelchair Mobility    Modified Rankin (Stroke Patients Only)       Balance Overall balance assessment: Needs assistance Sitting-balance support: Bilateral upper extremity supported;Feet supported Sitting balance-Leahy Scale: Poor Sitting balance - Comments: maintained balance sitting EOB     Standing balance-Leahy Scale: Poor                              Cognition Arousal/Alertness: Lethargic Behavior During Therapy: Flat affect Overall Cognitive Status: No family/caregiver present to determine baseline cognitive functioning Area of Impairment: Safety/judgement;Awareness;Following commands                     Memory: Decreased short-term memory Following Commands: Follows one step commands with increased time;Follows one step commands inconsistently Safety/Judgement: Decreased awareness of safety;Decreased awareness of deficits   Problem Solving: Slow processing;Difficulty sequencing;Decreased initiation General Comments: Pt very lethargic during session, unable to progress gt this session.      Exercises      General Comments        Pertinent Vitals/Pain Pain Assessment: Faces Faces Pain Scale: No hurt    Home Living                      Prior Function  PT Goals (current goals can now be found in the care plan section) Acute Rehab PT Goals Patient Stated Goal: return to PLOF Potential to Achieve Goals: Good Progress towards PT goals: Progressing toward goals    Frequency    Min 3X/week      PT Plan Current plan remains appropriate    Co-evaluation              AM-PAC PT "6 Clicks" Mobility   Outcome Measure  Help needed turning from your back to your  side while in a flat bed without using bedrails?: A Little Help needed moving from lying on your back to sitting on the side of a flat bed without using bedrails?: A Lot Help needed moving to and from a bed to a chair (including a wheelchair)?: A Lot Help needed standing up from a chair using your arms (e.g., wheelchair or bedside chair)?: A Lot Help needed to walk in hospital room?: Total Help needed climbing 3-5 steps with a railing? : Total 6 Click Score: 11    End of Session Equipment Utilized During Treatment: Gait belt Activity Tolerance: Patient limited by fatigue;Patient limited by lethargy Patient left: in bed;with call bell/phone within reach;with bed alarm set Nurse Communication: Mobility status PT Visit Diagnosis: Unsteadiness on feet (R26.81);Muscle weakness (generalized) (M62.81);Repeated falls (R29.6);Difficulty in walking, not elsewhere classified (R26.2)     Time: 0981-1914 PT Time Calculation (min) (ACUTE ONLY): 14 min  Charges:  $Therapeutic Activity: 8-22 mins                     Denise Santiago , PTA Acute Rehabilitation Services Pager [phone] Office [phone]     Denise Santiago Artis Delay 07/29/2019, 5:43 PM

## 2019-07-29 NOTE — Progress Notes (Signed)
PROGRESS NOTE    Denise Santiago  BLT:[id number] DOB: Apr 18, 1956 DOA: 07/26/2019 PCP: Seward Carol, MD    Brief Narrative:   64 year old woman with prior history of hypertension, diabetes, hyperlipidemia, CVA presents to ED for generalized weakness and fall.  On arrival to ED her labs were abnormal for elevated troponin, hypokalemia and hypomagnesemia.  Assessment & Plan:   Principal Problem:   Generalized weakness Active Problems:   Essential hypertension   Type 2 diabetes mellitus without complication (HCC)   CVA (cerebral vascular accident) ([place])   Hyperlipidemia   Hypokalemia   Hyponatremia   Acute on chronic kidney failure (HCC)   Thrombocytopenia (HCC)   Elevated troponin   Lactic acidosis   AKI (acute kidney injury) ([place])   Generalized weakness and fall Probably secondary to electrolyte abnormalities.  Initial CT of the head shows old right posterior parietal infarct without any acute intracranial abnormalities.  Electrolytes have been repleted. PT evaluation recommending CIR.    AKI superimposed on stage II CKD Creatinine improving with hydration Continue to monitor.   Essential hypertension Blood pressure parameters are well controlled.    Type 2 diabetes mellitus Hemoglobin A1c is 6.9. CBG (last 3)  Recent Labs    07/29/19 0558 07/29/19 0910 07/29/19 1246  GLUCAP 107* 108* 113*   Well controlled CBGs.    History of recent CVA Continue with Plavix and statin.    Hyperlipidemia Continue with statin.    Lactic acidosis In the setting of dehydration. Resolved   UTI:  Urine culture growing 80,000 E coli, start the patient on IV rocephin, and when her nausea improves transition to oral keflex in am.    DVT prophylaxis: SCDs Code Status: DNR Family Communication: None at bedside Disposition Plan:  . Patient came from: Home            . Anticipated d/c place: Pending CIR . Barriers to d/c OR conditions which need to be met to  effect a safe d/c: Ongoing treatment for urinary tract infection and persistent nausea   Consultants:   CIR   Procedures: Echocardiogram.    Antimicrobials: ROCEPHIN.    Subjective: Reports being nauseated, and dry heaving, no abdominal pain, no chest pain patient reports still feels weak and not back to baseline yet. Objective: Vitals:   07/28/19 2342 07/29/19 0412 07/29/19 0914 07/29/19 1250  BP: (!) 141/67 133/64 131/77 128/73  Pulse: 72 71 79 81  Resp: 18 19 20 16   Temp: 97.7 F (36.5 C) 98.2 F (36.8 C) (!) 97.5 F (36.4 C) 98.8 F (37.1 C)  TempSrc: Oral Oral Oral Oral  SpO2: 98% 100% 100% 100%  Weight:        Intake/Output Summary (Last 24 hours) at 07/29/2019 1414 Last data filed at 07/29/2019 0840 Gross per 24 hour  Intake 1594.07 ml  Output --  Net 1594.07 ml   Filed Weights   07/27/19 0450 07/28/19 0500  Weight: 83.1 kg 81.6 kg    Examination:  General exam: Appears calm and comfortable  Respiratory system: Clear to auscultation. Respiratory effort normal. Cardiovascular system: S1 & S2 heard, RRR. No JVD, No pedal edema. Gastrointestinal system: Abdomen is nondistended, soft and nontender.Normal bowel sounds heard. Central nervous system: lethargic but  answering questions appropriately.  Extremities: Symmetric 5 x 5 power. Skin: No rashes, lesions or ulcers Psychiatry: flat affect.     Data Reviewed: I have personally reviewed following labs and imaging studies  CBC: Recent Labs  Lab 07/26/19 0810 07/26/19 1709 07/27/19  0410 07/28/19 0358 07/29/19 0351  WBC 4.7 5.4 4.7 5.7 4.8  NEUTROABS  --   --   --   --  3.4  HGB 15.0 16.2* 14.4 13.9 14.1  HCT 44.3 46.7* 42.0 40.2 41.5  MCV 88.8 88.4 89.0 88.7 89.1  PLT 101* 88* 97* 97* 970*   Basic Metabolic Panel: Recent Labs  Lab 07/26/19 0810 07/26/19 1709 07/27/19 0410 07/28/19 0358 07/29/19 0351  NA 134*  --  137 139 137  K 2.9*  --  3.3* 4.1 4.6  CL 94*  --  99 107 107  CO2 27   --  26 20* 20*  GLUCOSE 136*  --  127* 102* 104*  BUN 28*  --  20 17 14   CREATININE 1.38* 1.04* 1.17* 1.23* 1.11*  CALCIUM 9.5  --  9.1 8.9 8.9  MG  --  2.7*  --   --   --    GFR: Estimated Creatinine Clearance: 50.7 mL/min (A) (by C-G formula based on SCr of 1.11 mg/dL (H)). Liver Function Tests: Recent Labs  Lab 07/26/19 0810  AST 40  ALT 27  ALKPHOS 64  BILITOT 1.4*  PROT 7.7  ALBUMIN 3.5   Recent Labs  Lab 07/26/19 0810  LIPASE 24   No results for input(s): AMMONIA in the last 168 hours. Coagulation Profile: No results for input(s): INR, PROTIME in the last 168 hours. Cardiac Enzymes: Recent Labs  Lab 07/26/19 1709 07/27/19 0410  CKTOTAL 429* 223  CKMB 22.3*  --    BNP (last 3 results) No results for input(s): PROBNP in the last 8760 hours. HbA1C: Recent Labs    07/26/19 1709  HGBA1C 6.9*   CBG: Recent Labs  Lab 07/28/19 1643 07/28/19 2110 07/29/19 0558 07/29/19 0910 07/29/19 1246  GLUCAP 86 97 107* 108* 113*   Lipid Profile: No results for input(s): CHOL, HDL, LDLCALC, TRIG, CHOLHDL, LDLDIRECT in the last 72 hours. Thyroid Function Tests: No results for input(s): TSH, T4TOTAL, FREET4, T3FREE, THYROIDAB in the last 72 hours. Anemia Panel: No results for input(s): VITAMINB12, FOLATE, FERRITIN, TIBC, IRON, RETICCTPCT in the last 72 hours. Sepsis Labs: Recent Labs  Lab 07/26/19 1104 07/26/19 1843 07/26/19 2156  LATICACIDVEN 3.0* 1.8 1.8    Recent Results (from the past 240 hour(s))  Culture, blood (Routine X 2) w Reflex to ID Panel     Status: None (Preliminary result)   Collection Time: 07/26/19 11:04 AM   Specimen: BLOOD LEFT HAND  Result Value Ref Range Status   Specimen Description BLOOD LEFT HAND  Final   Special Requests   Final    BOTTLES DRAWN AEROBIC AND ANAEROBIC Blood Culture adequate volume   Culture   Final    NO GROWTH 3 DAYS Performed at [facility], [place] [address] [place], [place] [address]    Report Status  PENDING  Incomplete  SARS CORONAVIRUS 2 (TAT 6-24 HRS) Nasopharyngeal Nasopharyngeal Swab     Status: None   Collection Time: 07/26/19 12:44 PM   Specimen: Nasopharyngeal Swab  Result Value Ref Range Status   SARS Coronavirus 2 NEGATIVE NEGATIVE Final    Comment: (NOTE) SARS-CoV-2 target nucleic acids are NOT DETECTED. The SARS-CoV-2 RNA is generally detectable in upper and lower respiratory specimens during the acute phase of infection. Negative results do not preclude SARS-CoV-2 infection, do not rule out co-infections with other pathogens, and should not be used as the sole basis for treatment or other patient management decisions. Negative results must  be combined with clinical observations, patient history, and epidemiological information. The expected result is Negative. Fact Sheet for Patients: SugarRoll.be Fact Sheet for Healthcare Providers: https://www.woods-mathews.com/ This test is not yet approved or cleared by the [place] FDA and  has been authorized for detection and/or diagnosis of SARS-CoV-2 by FDA under an Emergency Use Authorization (EUA). This EUA will remain  in effect (meaning this test can be used) for the duration of the COVID-19 declaration under Section 56 4(b)(1) of the Act, 21 U.S.C. section 360bbb-3(b)(1), unless the authorization is terminated or revoked sooner. Performed at [facility], [place] [address] [place], [place] [address]   Culture, blood (Routine X 2) w Reflex to ID Panel     Status: None (Preliminary result)   Collection Time: 07/26/19  5:09 PM   Specimen: BLOOD LEFT HAND  Result Value Ref Range Status   Specimen Description BLOOD LEFT HAND  Final   Special Requests   Final    BOTTLES DRAWN AEROBIC ONLY Blood Culture results may not be optimal due to an inadequate volume of blood received in culture bottles   Culture   Final    NO GROWTH 3 DAYS Performed at [facility], [place] [address] [place], [place] [address]    Report Status PENDING  Incomplete  Urine Culture     Status: Abnormal   Collection Time: 07/27/19  6:20 AM   Specimen: Urine, Random  Result Value Ref Range Status   Specimen Description URINE, RANDOM  Final   Special Requests   Final    NONE Performed at [facility], [place] [address], [place], [place] [address]    Culture 80,000 COLONIES/mL ESCHERICHIA COLI (A)  Final   Report Status 07/29/2019 FINAL  Final   Organism ID, Bacteria ESCHERICHIA COLI (A)  Final      Susceptibility   Escherichia coli - MIC*    AMPICILLIN <=2 SENSITIVE Sensitive     CEFAZOLIN <=4 SENSITIVE Sensitive     CEFTRIAXONE <=0.25 SENSITIVE Sensitive     CIPROFLOXACIN <=0.25 SENSITIVE Sensitive     GENTAMICIN <=1 SENSITIVE Sensitive     IMIPENEM <=0.25 SENSITIVE Sensitive     NITROFURANTOIN <=16 SENSITIVE Sensitive     TRIMETH/SULFA <=20 SENSITIVE Sensitive     AMPICILLIN/SULBACTAM <=2 SENSITIVE Sensitive     * 80,000 COLONIES/mL ESCHERICHIA COLI         Radiology Studies: No results found.      Scheduled Meds: . atorvastatin  80 mg Oral Daily  . clopidogrel  75 mg Oral Daily  . insulin aspart  0-15 Units Subcutaneous TID WC  . insulin aspart  0-5 Units Subcutaneous QHS  . latanoprost  1 drop Both Eyes QHS  . nicotine  21 mg Transdermal Daily  . sodium chloride flush  3 mL Intravenous Once   Continuous Infusions: . 0.9 % NaCl with KCl 40 mEq / L 50 mL/hr (07/28/19 1801)     LOS: 2 days        Hosie Poisson, MD [facility]   To contact the attending provider between 7A-7P or the covering provider during after hours 7P-7A, please log into the web site [email or web address] and access using universal [place] password for that web site. If you do not have the password, please call the hospital operator.  07/29/2019, 2:14 PM

## 2019-07-29 NOTE — Progress Notes (Signed)
Progress Note  Patient Name: Denise Santiago Date of Encounter: 07/29/2019  Primary Cardiologist: Candee Furbish, MD   Subjective   Patient has been working with PT and denies CP or SOB. Asking when can she go.  Inpatient Medications    Scheduled Meds: . atorvastatin  80 mg Oral Daily  . clopidogrel  75 mg Oral Daily  . insulin aspart  0-15 Units Subcutaneous TID WC  . insulin aspart  0-5 Units Subcutaneous QHS  . latanoprost  1 drop Both Eyes QHS  . nicotine  21 mg Transdermal Daily  . sodium chloride flush  3 mL Intravenous Once   Continuous Infusions: . 0.9 % NaCl with KCl 40 mEq / L 50 mL/hr (07/28/19 1801)   PRN Meds: acetaminophen **OR** acetaminophen, ondansetron **OR** ondansetron (ZOFRAN) IV   Vital Signs    Vitals:   07/28/19 2004 07/28/19 2342 07/29/19 0412 07/29/19 0914  BP: 139/72 (!) 141/67 133/64 131/77  Pulse: 81 72 71 79  Resp: 18 18 19 20   Temp: 97.7 F (36.5 C) 97.7 F (36.5 C) 98.2 F (36.8 C) (!) 97.5 F (36.4 C)  TempSrc: Oral Oral Oral Oral  SpO2: 100% 98% 100% 100%  Weight:        Intake/Output Summary (Last 24 hours) at 07/29/2019 1138 Last data filed at 07/29/2019 0840 Gross per 24 hour  Intake 1594.07 ml  Output -  Net 1594.07 ml   Last 3 Weights 07/28/2019 07/27/2019 03/20/2019  Weight (lbs) 179 lb 14.3 oz 183 lb 3.2 oz 220 lb  Weight (kg) 81.6 kg 83.1 kg 99.791 kg      Telemetry    NSR, HR 70-80s - Personally Reviewed  ECG    No new - Personally Reviewed  Physical Exam   GEN: No acute distress.   Neck: No JVD Cardiac: RRR, no murmurs, rubs, or gallops.  Respiratory: Clear to auscultation bilaterally. GI: Soft, nontender, non-distended  MS: No edema; No deformity. Neuro:  Nonfocal  Psych: Normal affect   Labs    High Sensitivity Troponin:   Recent Labs  Lab 07/26/19 0810 07/26/19 1104  TROPONINIHS 66* 76*      Chemistry Recent Labs  Lab 07/26/19 0810 07/26/19 1709 07/27/19 0410 07/28/19 0358 07/29/19  0351  NA 134*  --  137 139 137  K 2.9*  --  3.3* 4.1 4.6  CL 94*  --  99 107 107  CO2 27  --  26 20* 20*  GLUCOSE 136*  --  127* 102* 104*  BUN 28*  --  20 17 14   CREATININE 1.38*   < > 1.17* 1.23* 1.11*  CALCIUM 9.5  --  9.1 8.9 8.9  PROT 7.7  --   --   --   --   ALBUMIN 3.5  --   --   --   --   AST 40  --   --   --   --   ALT 27  --   --   --   --   ALKPHOS 64  --   --   --   --   BILITOT 1.4*  --   --   --   --   GFRNONAA 40*   < > 49* 46* 52*  GFRAA 47*   < > 57* 54* >60  ANIONGAP 13  --  12 12 10    < > = values in this interval not displayed.     Hematology Recent Labs  Lab 07/27/19  0410 07/28/19 0358 07/29/19 0351  WBC 4.7 5.7 4.8  RBC 4.72 4.53 4.66  HGB 14.4 13.9 14.1  HCT 42.0 40.2 41.5  MCV 89.0 88.7 89.1  MCH 30.5 30.7 30.3  MCHC 34.3 34.6 34.0  RDW 12.6 13.0 13.3  PLT 97* 97* 110*    BNP Recent Labs  Lab 07/26/19 0810  BNP 64.2     DDimer No results for input(s): DDIMER in the last 168 hours.   Radiology    No results found.  Cardiac Studies   Limited ECHO: 03/19/19  1. Left ventricular ejection fraction, by visual estimation, is 60 to  65%. The left ventricle has normal function. There is no left ventricular  hypertrophy.  2. Left ventricular diastolic parameters are consistent with Grade I  diastolic dysfunction (impaired relaxation).  3. The left ventricle has no regional wall motion abnormalities.  4. Global right ventricle has normal systolic function.The right  ventricular size is normal. No increase in right ventricular wall  thickness.  5. Left atrial size was normal.  6. Right atrial size was normal.  7. Presence of pericardial fat pad.  8. Trivial pericardial effusion is present.  9. The mitral valve is grossly normal. No evidence of mitral valve  regurgitation.  10. The tricuspid valve is grossly normal. Tricuspid valve regurgitation  is not demonstrated.  11. The aortic valve is grossly normal. Aortic valve  regurgitation is not  visualized. No evidence of aortic valve sclerosis or stenosis.  12. The pulmonic valve was grossly normal. Pulmonic valve regurgitation is  not visualized.  13. TR signal is inadequate for assessing pulmonary artery systolic  pressure.  14. The inferior vena cava is normal in size with greater than 50%  respiratory variability, suggesting right atrial pressure of 3 mmHg.   Patient Profile     64 y.o. female  with a hx of DM, thrombocytopenia, HTN, HLD, CVA 12/2018 & 03/2019,ongoing tobacco use,who is being seen for the evaluation of elevated troponin in the setting of syncope and generalized weakness.   Assessment & Plan    Elevated troponin - Hs troponin 66>76 - in the setting of fall and elevated CK at 223 - Patient denies chest pain - No prior cardiac h/o - Limited echo 03/2019 with no wall motion abnormalities and preserved EF - Will plan for OP non-invasive testing  Prolonged QT - QTc 489 and subsequently on EKG - Mag and K supplemented - Avoid QT prolonging drugs  Weakness/fall - CT head with no acute abnormalities - IVF. Electrolytes improving - PT recommending CIR  For questions or updates, please contact [facility] Please consult [email or web address] for contact info under        Signed, Brandey Vandalen David Stall, PA-C  07/29/2019, 11:38 AM

## 2019-07-30 ENCOUNTER — Ambulatory Visit (INDEPENDENT_AMBULATORY_CARE_PROVIDER_SITE_OTHER): Payer: Self-pay | Admitting: *Deleted

## 2019-07-30 ENCOUNTER — Inpatient Hospital Stay (HOSPITAL_COMMUNITY): Payer: Self-pay

## 2019-07-30 DIAGNOSIS — I6319 Cerebral infarction due to embolism of other precerebral artery: Secondary | ICD-10-CM

## 2019-07-30 LAB — GLUCOSE, CAPILLARY
Glucose-Capillary: 148 mg/dL — ABNORMAL HIGH (ref 70–99)
Glucose-Capillary: 106 mg/dL — ABNORMAL HIGH (ref 70–99)
Glucose-Capillary: 88 mg/dL (ref 70–99)
Glucose-Capillary: 80 mg/dL (ref 70–99)

## 2019-07-30 LAB — BASIC METABOLIC PANEL
Anion gap: 10 (ref 5–15)
BUN: 17 mg/dL (ref 8–23)
CO2: 20 mmol/L — ABNORMAL LOW (ref 22–32)
Calcium: 8.9 mg/dL (ref 8.9–10.3)
Chloride: 109 mmol/L (ref 98–111)
Creatinine, Ser: 1.18 mg/dL — ABNORMAL HIGH (ref 0.44–1.00)
GFR calc Af Amer: 56 mL/min — ABNORMAL LOW (ref >60)
GFR calc non Af Amer: 49 mL/min — ABNORMAL LOW (ref >60)
Glucose, Bld: 104 mg/dL — ABNORMAL HIGH (ref 70–99)
Potassium: 4.9 mmol/L (ref 3.5–5.1)
Sodium: 139 mmol/L (ref 135–145)

## 2019-07-30 LAB — MAGNESIUM: Magnesium: 2 mg/dL (ref 1.7–2.4)

## 2019-07-30 MED ORDER — POLYETHYLENE GLYCOL 3350 17 G PO PACK
17.0000 g | PACK | Freq: Two times a day (BID) | ORAL | Status: DC
  Administered 2019-07-30 – 2019-08-06 (×13): 17 g via ORAL
  Filled 2019-07-30 (×14): qty 1

## 2019-07-30 MED ORDER — BISACODYL 10 MG RE SUPP
10.0000 mg | Freq: Once | RECTAL | Status: AC
  Administered 2019-07-30: 10 mg via RECTAL
  Filled 2019-07-30: qty 1

## 2019-07-30 MED ORDER — SENNA 8.6 MG PO TABS
1.0000 | ORAL_TABLET | Freq: Every day | ORAL | Status: DC
  Administered 2019-07-30 – 2019-08-06 (×6): 8.6 mg via ORAL
  Filled 2019-07-30 (×8): qty 1

## 2019-07-30 NOTE — Progress Notes (Signed)
Physical Therapy Treatment Patient Details Name: Deondria Puryear MRN: [id number] DOB: [birth year] Today's Date: 07/30/2019    History of Present Illness 64 y.o. female with a hx of DM, HTN, HLD, CVA 12/2018 & 03/2019, ongoing tob use who presented to ED with generalized weakness and elevated troponin, thought to be non specific by cardiology. pt reports weakness after COVID vaccine 07/16/19. The day prior to admission pt slid out of chair to floor and remained through the night.     PT Comments    Pt in recliner and RN asked PTA for assistance to move back to bed.  Based on previouys function utilized sara stedy for transfer back to bed.  She continues to require +2 max for assistance back to bed.    Follow Up Recommendations  Supervision/Assistance - 24 hour;SNF     Equipment Recommendations  None recommended by PT    Recommendations for Other Services Speech consult     Precautions / Restrictions Precautions Precautions: Fall Precaution Comments: reports falls since COVID vaccine Restrictions Weight Bearing Restrictions: No    Mobility  Bed Mobility Overal bed mobility: Needs Assistance Bed Mobility: Sit to Supine      Sit to supine: Total assist;+2 for physical assistance   General bed mobility comments: Once sitting edge of bed patient required assistance to lower trunk and Lift B LEs.  She presents with lethargy and she was drooling sitting edge of bed.  Transfers Overall transfer level: Needs assistance Equipment used: Ambulation equipment used(sara stedy) Transfers: Sit to/from Stand Sit to Stand: +2 physical assistance;Max assist Stand pivot transfers: Total assist;+2 physical assistance;Max assist       General transfer comment: Assistance utilizing bed pad to boost into standing.  Pt required assistance to weight shift forward and for hand placement to pull into standing.  Ambulation/Gait       Stairs             Wheelchair Mobility     Modified Rankin (Stroke Patients Only)       Balance Overall balance assessment: Needs assistance Sitting-balance support: Bilateral upper extremity supported;Feet supported Sitting balance-Leahy Scale: Poor Sitting balance - Comments: maintained balance sitting EOB     Standing balance-Leahy Scale: Poor Standing balance comment: unable to stand without UE support                            Cognition Arousal/Alertness: Lethargic Behavior During Therapy: Flat affect Overall Cognitive Status: No family/caregiver present to determine baseline cognitive functioning Area of Impairment: Safety/judgement;Awareness;Following commands                     Memory: Decreased short-term memory Following Commands: Follows one step commands with increased time;Follows one step commands inconsistently Safety/Judgement: Decreased awareness of safety;Decreased awareness of deficits   Problem Solving: Slow processing;Difficulty sequencing;Decreased initiation General Comments: Pt very lethargic during session, continues to be unable to progress gt this session.  She is noted to have difficulty swallowing and found with food pocketing in lower jaw and would not swallow, used yanker to remove and gave patient some gingerale with a straw and she noted to cough after.  Informed RN.      Exercises      General Comments        Pertinent Vitals/Pain Pain Assessment: No/denies pain    Home Living  Prior Function            PT Goals (current goals can now be found in the care plan section) Acute Rehab PT Goals Patient Stated Goal: return to PLOF PT Goal Formulation: With patient Potential to Achieve Goals: Good Progress towards PT goals: Progressing toward goals    Frequency    Min 3X/week      PT Plan Current plan remains appropriate    Co-evaluation              AM-PAC PT "6 Clicks" Mobility   Outcome Measure  Help  needed turning from your back to your side while in a flat bed without using bedrails?: Total Help needed moving from lying on your back to sitting on the side of a flat bed without using bedrails?: Total Help needed moving to and from a bed to a chair (including a wheelchair)?: Total Help needed standing up from a chair using your arms (e.g., wheelchair or bedside chair)?: Total Help needed to walk in hospital room?: Total Help needed climbing 3-5 steps with a railing? : Total 6 Click Score: 6    End of Session Equipment Utilized During Treatment: Gait belt Activity Tolerance: Patient limited by fatigue;Patient limited by lethargy Patient left: in bed;with call bell/phone within reach;with bed alarm set Nurse Communication: Mobility status PT Visit Diagnosis: Unsteadiness on feet (R26.81);Muscle weakness (generalized) (M62.81);Repeated falls (R29.6);Difficulty in walking, not elsewhere classified (R26.2)     Time: 2836-6294 PT Time Calculation (min) (ACUTE ONLY): 12 min  Charges:  $Therapeutic Activity: 8-22 mins                   Bonney Leitz , PTA Acute Rehabilitation Services Pager [phone] Office [phone]     Marsa Matteo Artis Delay 07/30/2019, 6:01 PM

## 2019-07-30 NOTE — Progress Notes (Signed)
Physical Therapy Treatment Patient Details Name: Denise Santiago MRN: [id number] DOB: [birth year] Today's Date: 07/30/2019    History of Present Illness 64 y.o. female with a hx of DM, HTN, HLD, CVA 12/2018 & 03/2019, ongoing tob use who presented to ED with generalized weakness and elevated troponin, thought to be non specific by cardiology. pt reports weakness after COVID vaccine 07/16/19. The day prior to admission pt slid out of chair to floor and remained through the night.     PT Comments    Pt supine in bed on arrival.  Required increased assistance to mobilize to edge of bed, transfer, and advance steps from bed to recliner.  She presents with oral pocketing and require suction with yanker.  She was noted to cough after drinking ginger ale.  Pt continues to decline functionally, she was ambulating 2 days ago but now struggles to stand with +2 assistance.  Continue to recommend SNF.  Of note: L hand very cold compared to her R.    Follow Up Recommendations  Supervision/Assistance - 24 hour;SNF     Equipment Recommendations  None recommended by PT    Recommendations for Other Services Speech consult     Precautions / Restrictions Precautions Precautions: Fall Precaution Comments: reports falls since COVID vaccine Restrictions Weight Bearing Restrictions: No    Mobility  Bed Mobility Overal bed mobility: Needs Assistance Bed Mobility: Supine to Sit     Supine to sit: Max assist;+2 for physical assistance     General bed mobility comments: Assistance to advance B LEs, scoot to edge of bed and elevate trunk in sitting.  Presents with posterior pelvic tilt and lean in sitting.  Pt fearful to translate weight forward as she feels like she is falling.  Transfers Overall transfer level: Needs assistance Equipment used: Rolling walker (2 wheeled) Transfers: Sit to/from Stand Sit to Stand: +2 physical assistance;Max assist Stand pivot transfers: Total assist;+2 physical  assistance;Max assist       General transfer comment: Cues for hand placement, Pt cued to push with B LEs and begins to extend her L knee.  Posterior translation noted in standing and she required increased assistance sliding feet from bed to recliner.  She took a few steps but quality was very poor with wide BOS.  Ambulation/Gait Ambulation/Gait assistance: Max assist;+2 physical assistance;Total assist Gait Distance (Feet): 4 Feet(steps from bed to recliner with poor quality, decreased foot clearance and wide BOS.  Pt sitting prematurelyat recliner.) Assistive device: Rolling walker (2 wheeled) Gait Pattern/deviations: Step-to pattern;Decreased stride length;Shuffle;Leaning posteriorly;Trunk flexed         Stairs             Wheelchair Mobility    Modified Rankin (Stroke Patients Only)       Balance Overall balance assessment: Needs assistance Sitting-balance support: Bilateral upper extremity supported;Feet supported Sitting balance-Leahy Scale: Poor Sitting balance - Comments: maintained balance sitting EOB     Standing balance-Leahy Scale: Poor                              Cognition Arousal/Alertness: Lethargic Behavior During Therapy: Flat affect Overall Cognitive Status: No family/caregiver present to determine baseline cognitive functioning Area of Impairment: Safety/judgement;Awareness;Following commands                     Memory: Decreased short-term memory Following Commands: Follows one step commands with increased time;Follows one step commands inconsistently Safety/Judgement: Decreased awareness of  safety;Decreased awareness of deficits   Problem Solving: Slow processing;Difficulty sequencing;Decreased initiation General Comments: Pt very lethargic during session, continues to be unable to progress gt this session.  She is noted to have difficulty swallowing and found with food pocketing in lower jaw and would not swallow, used  yanker to remove and gave patient some gingerale with a straw and she noted to cough after.  Informed RN.      Exercises      General Comments        Pertinent Vitals/Pain Pain Assessment: No/denies pain    Home Living                      Prior Function            PT Goals (current goals can now be found in the care plan section) Acute Rehab PT Goals Patient Stated Goal: return to PLOF PT Goal Formulation: With patient Potential to Achieve Goals: Good Progress towards PT goals: Progressing toward goals    Frequency    Min 3X/week      PT Plan Current plan remains appropriate    Co-evaluation              AM-PAC PT "6 Clicks" Mobility   Outcome Measure  Help needed turning from your back to your side while in a flat bed without using bedrails?: Total Help needed moving from lying on your back to sitting on the side of a flat bed without using bedrails?: Total Help needed moving to and from a bed to a chair (including a wheelchair)?: Total Help needed standing up from a chair using your arms (e.g., wheelchair or bedside chair)?: Total Help needed to walk in hospital room?: Total Help needed climbing 3-5 steps with a railing? : Total 6 Click Score: 6    End of Session Equipment Utilized During Treatment: Gait belt Activity Tolerance: Patient limited by fatigue;Patient limited by lethargy Patient left: in bed;with call bell/phone within reach;with bed alarm set Nurse Communication: Mobility status PT Visit Diagnosis: Unsteadiness on feet (R26.81);Muscle weakness (generalized) (M62.81);Repeated falls (R29.6);Difficulty in walking, not elsewhere classified (R26.2)     Time: 4270-6237 PT Time Calculation (min) (ACUTE ONLY): 18 min  Charges:  $Therapeutic Activity: 8-22 mins                     Bonney Leitz , PTA Acute Rehabilitation Services Pager [phone] Office [phone]     Denise Santiago Artis Delay 07/30/2019, 5:39 PM

## 2019-07-30 NOTE — Progress Notes (Signed)
PROGRESS NOTE    Denise Santiago  DGU:[id number] DOB: 1955-12-29 DOA: 07/26/2019 PCP: Seward Carol, MD    Brief Narrative:   64 year old woman with prior history of hypertension, diabetes, hyperlipidemia, CVA presents to ED for generalized weakness and fall.  On arrival to ED her labs were abnormal for elevated troponin, hypokalemia and hypomagnesemia.  Electrolytes were all repleted and therapy evaluations are recommending SNF at this time.  Patient seen and examined this morning she denies any chest pain shortness of breath, nausea or vomiting or abdominal pain.  Assessment & Plan:   Principal Problem:   Generalized weakness Active Problems:   Essential hypertension   Type 2 diabetes mellitus without complication (HCC)   CVA (cerebral vascular accident) ([place])   Hyperlipidemia   Hypokalemia   Hyponatremia   Acute on chronic kidney failure (HCC)   Thrombocytopenia (HCC)   Elevated troponin   Lactic acidosis   AKI (acute kidney injury) ([place])   Generalized weakness and fall Probably secondary to electrolyte abnormalities.  Initial CT of the head shows old right posterior parietal infarct without any acute intracranial abnormalities.  Electrolytes have been repleted.  But patient continues to have generalized weakness in 7 days progressing poorly with physical therapy.  As per the family patient has bouts of confusion and times of lucidity.  MRI of the brain without contrast ordered for further evaluation. PT evaluation recommending CIR but as patient does not have enough support/caregiver assistance at home and family is inclined towards SNF as per inpatient rehab admission coordinator.  TOC consulted for SNF placement at this time.    AKI superimposed on stage II CKD Creatinine improving with hydration. Continue to monitor.   Essential hypertension Blood pressure parameters are suboptimally controlled, as needed hydralazine will be ordered.    Type 2 diabetes mellitus  Hemoglobin A1c is 6.9. CBG (last 3)  Recent Labs    07/29/19 2114 07/30/19 0613 07/30/19 1207  GLUCAP 124* 148* 106*  CBGs are well controlled continue with sliding scale insulin.    History of recent CVA Continue with Plavix and statin.    Hyperlipidemia Continue with statin.    Lactic acidosis In the setting of dehydration. Resolved   UTI:  Urine culture growing 80,000 E coli, started the patient on IV Rocephin,  plan for 3 days of IV antibiotics.   Chronic thrombocytopenia Platelets have been low in the past . No obvious signs of bleeding.  Continue to monitor      DVT prophylaxis: SCDs Code Status: DNR Family Communication: None at bedside, called family on the phone but could not leave a message. Disposition Plan:  . Patient came from: Home            . Anticipated d/c place: SNF . Barriers to d/c OR conditions which need to be met to effect a safe d/c: Ongoing treatment for urinary tract infection and generalized weakness and work-up with an MRI of the brain pending   Consultants:   CIR   Procedures: Echocardiogram.    Antimicrobials: ROCEPHIN.    Subjective: Patient reports slight dizziness and weakness today but denies any chest pain shortness of breath, nausea, vomiting, abdominal pain. Objective: Vitals:   07/29/19 2355 07/30/19 0437 07/30/19 0750 07/30/19 1205  BP: (!) 147/69 131/63 (!) 152/81 (!) 160/80  Pulse: 86 84 76 81  Resp: 18 18 20 18   Temp: 97.8 F (36.6 C) 98.7 F (37.1 C) 98.6 F (37 C) 98.9 F (37.2 C)  TempSrc: Oral  Oral  Oral  SpO2: 100% 100% 100% 100%  Weight:        Intake/Output Summary (Last 24 hours) at 07/30/2019 1311 Last data filed at 07/30/2019 0600 Gross per 24 hour  Intake 1133.94 ml  Output 485 ml  Net 648.94 ml   Filed Weights   07/27/19 0450 07/28/19 0500  Weight: 83.1 kg 81.6 kg    Examination:  General exam: Calm and comfortable, not in any kind of distress. Respiratory system: Clear to  auscultation bilaterally, no wheezing or rhonchi. Cardiovascular system: S1-S2 heard, regular rate rhythm, no JVD Gastrointestinal system: Abdomen is soft, nontender, nondistended, bowel sounds normal Central nervous system: Alert and answering questions appropriately and able to move all extremities. Extremities: No cyanosis or clubbing Skin: No rashes seen psychiatry: flat affect.     Data Reviewed: I have personally reviewed following labs and imaging studies  CBC: Recent Labs  Lab 07/26/19 0810 07/26/19 1709 07/27/19 0410 07/28/19 0358 07/29/19 0351  WBC 4.7 5.4 4.7 5.7 4.8  NEUTROABS  --   --   --   --  3.4  HGB 15.0 16.2* 14.4 13.9 14.1  HCT 44.3 46.7* 42.0 40.2 41.5  MCV 88.8 88.4 89.0 88.7 89.1  PLT 101* 88* 97* 97* 353*   Basic Metabolic Panel: Recent Labs  Lab 07/26/19 0810 07/26/19 0810 07/26/19 1709 07/27/19 0410 07/28/19 0358 07/29/19 0351 07/30/19 0754  NA 134*  --   --  137 139 137 139  K 2.9*  --   --  3.3* 4.1 4.6 4.9  CL 94*  --   --  99 107 107 109  CO2 27  --   --  26 20* 20* 20*  GLUCOSE 136*  --   --  127* 102* 104* 104*  BUN 28*  --   --  20 17 14 17   CREATININE 1.38*   < > 1.04* 1.17* 1.23* 1.11* 1.18*  CALCIUM 9.5  --   --  9.1 8.9 8.9 8.9  MG  --   --  2.7*  --   --   --  2.0   < > = values in this interval not displayed.   GFR: Estimated Creatinine Clearance: 47.7 mL/min (A) (by C-G formula based on SCr of 1.18 mg/dL (H)). Liver Function Tests: Recent Labs  Lab 07/26/19 0810  AST 40  ALT 27  ALKPHOS 64  BILITOT 1.4*  PROT 7.7  ALBUMIN 3.5   Recent Labs  Lab 07/26/19 0810  LIPASE 24   No results for input(s): AMMONIA in the last 168 hours. Coagulation Profile: No results for input(s): INR, PROTIME in the last 168 hours. Cardiac Enzymes: Recent Labs  Lab 07/26/19 1709 07/27/19 0410  CKTOTAL 429* 223  CKMB 22.3*  --    BNP (last 3 results) No results for input(s): PROBNP in the last 8760 hours. HbA1C: No results  for input(s): HGBA1C in the last 72 hours. CBG: Recent Labs  Lab 07/29/19 1246 07/29/19 1624 07/29/19 2114 07/30/19 0613 07/30/19 1207  GLUCAP 113* 100* 124* 148* 106*   Lipid Profile: No results for input(s): CHOL, HDL, LDLCALC, TRIG, CHOLHDL, LDLDIRECT in the last 72 hours. Thyroid Function Tests: No results for input(s): TSH, T4TOTAL, FREET4, T3FREE, THYROIDAB in the last 72 hours. Anemia Panel: No results for input(s): VITAMINB12, FOLATE, FERRITIN, TIBC, IRON, RETICCTPCT in the last 72 hours. Sepsis Labs: Recent Labs  Lab 07/26/19 1104 07/26/19 1843 07/26/19 2156  LATICACIDVEN 3.0* 1.8 1.8    Recent Results (from the  past 240 hour(s))  Culture, blood (Routine X 2) w Reflex to ID Panel     Status: None (Preliminary result)   Collection Time: 07/26/19 11:04 AM   Specimen: BLOOD LEFT HAND  Result Value Ref Range Status   Specimen Description BLOOD LEFT HAND  Final   Special Requests   Final    BOTTLES DRAWN AEROBIC AND ANAEROBIC Blood Culture adequate volume   Culture   Final    NO GROWTH 4 DAYS Performed at [facility], [place] [address], [place], [place] [address]    Report Status PENDING  Incomplete  SARS CORONAVIRUS 2 (TAT 6-24 HRS) Nasopharyngeal Nasopharyngeal Swab     Status: None   Collection Time: 07/26/19 12:44 PM   Specimen: Nasopharyngeal Swab  Result Value Ref Range Status   SARS Coronavirus 2 NEGATIVE NEGATIVE Final    Comment: (NOTE) SARS-CoV-2 target nucleic acids are NOT DETECTED. The SARS-CoV-2 RNA is generally detectable in upper and lower respiratory specimens during the acute phase of infection. Negative results do not preclude SARS-CoV-2 infection, do not rule out co-infections with other pathogens, and should not be used as the sole basis for treatment or other patient management decisions. Negative results must be combined with clinical observations, patient history, and epidemiological information. The expected result is Negative.  Fact Sheet for Patients: SugarRoll.be Fact Sheet for Healthcare Providers: https://www.woods-mathews.com/ This test is not yet approved or cleared by the [place] FDA and  has been authorized for detection and/or diagnosis of SARS-CoV-2 by FDA under an Emergency Use Authorization (EUA). This EUA will remain  in effect (meaning this test can be used) for the duration of the COVID-19 declaration under Section 56 4(b)(1) of the Act, 21 U.S.C. section 360bbb-3(b)(1), unless the authorization is terminated or revoked sooner. Performed at [facility], [place] [address], [place], [place] [address]   Culture, blood (Routine X 2) w Reflex to ID Panel     Status: None (Preliminary result)   Collection Time: 07/26/19  5:09 PM   Specimen: BLOOD LEFT HAND  Result Value Ref Range Status   Specimen Description BLOOD LEFT HAND  Final   Special Requests   Final    BOTTLES DRAWN AEROBIC ONLY Blood Culture results may not be optimal due to an inadequate volume of blood received in culture bottles   Culture   Final    NO GROWTH 4 DAYS Performed at [facility], [place] [address], [place], [place] [address]    Report Status PENDING  Incomplete  Urine Culture     Status: Abnormal   Collection Time: 07/27/19  6:20 AM   Specimen: Urine, Random  Result Value Ref Range Status   Specimen Description URINE, RANDOM  Final   Special Requests   Final    NONE Performed at [facility], [place] [address] [place], [place] [address]    Culture 80,000 COLONIES/mL ESCHERICHIA COLI (A)  Final   Report Status 07/29/2019 FINAL  Final   Organism ID, Bacteria ESCHERICHIA COLI (A)  Final      Susceptibility   Escherichia coli - MIC*    AMPICILLIN <=2 SENSITIVE Sensitive     CEFAZOLIN <=4 SENSITIVE Sensitive     CEFTRIAXONE <=0.25 SENSITIVE Sensitive     CIPROFLOXACIN <=0.25 SENSITIVE Sensitive     GENTAMICIN <=1 SENSITIVE Sensitive     IMIPENEM <=0.25  SENSITIVE Sensitive     NITROFURANTOIN <=16 SENSITIVE Sensitive     TRIMETH/SULFA <=20 SENSITIVE Sensitive  AMPICILLIN/SULBACTAM <=2 SENSITIVE Sensitive     * 80,000 COLONIES/mL ESCHERICHIA COLI         Radiology Studies: No results found.      Scheduled Meds: . atorvastatin  80 mg Oral Daily  . clopidogrel  75 mg Oral Daily  . insulin aspart  0-15 Units Subcutaneous TID WC  . insulin aspart  0-5 Units Subcutaneous QHS  . latanoprost  1 drop Both Eyes QHS  . nicotine  21 mg Transdermal Daily  . sodium chloride flush  3 mL Intravenous Once   Continuous Infusions: . 0.9 % NaCl with KCl 40 mEq / L Stopped (07/29/19 1518)  . cefTRIAXone (ROCEPHIN)  IV Stopped (07/29/19 1635)     LOS: 3 days        Hosie Poisson, MD [facility]   To contact the attending provider between 7A-7P or the covering provider during after hours 7P-7A, please log into the web site [email or web address] and access using universal [place] password for that web site. If you do not have the password, please call the hospital operator.  07/30/2019, 1:11 PM

## 2019-07-30 NOTE — Progress Notes (Signed)
During lunch, the patient was found to be pocketing her food and also having a cool body temperature. Their mouth was suctioned, warm blankets were given, as well as a consult with SLP. Patient has since then shown that they may not be able to handle their own secretions, as seen through their drooling.

## 2019-07-31 LAB — CUP PACEART REMOTE DEVICE CHECK
Date Time Interrogation Session: 20210325195700
Implantable Pulse Generator Implant Date: 20201113

## 2019-07-31 LAB — BASIC METABOLIC PANEL
Anion gap: 10 (ref 5–15)
BUN: 15 mg/dL (ref 8–23)
CO2: 22 mmol/L (ref 22–32)
Calcium: 9 mg/dL (ref 8.9–10.3)
Chloride: 108 mmol/L (ref 98–111)
Creatinine, Ser: 0.96 mg/dL (ref 0.44–1.00)
GFR calc Af Amer: >60 mL/min (ref >60)
GFR calc non Af Amer: >60 mL/min (ref >60)
Glucose, Bld: 84 mg/dL (ref 70–99)
Potassium: 4.2 mmol/L (ref 3.5–5.1)
Sodium: 140 mmol/L (ref 135–145)

## 2019-07-31 LAB — GLUCOSE, CAPILLARY
Glucose-Capillary: 112 mg/dL — ABNORMAL HIGH (ref 70–99)
Glucose-Capillary: 77 mg/dL (ref 70–99)
Glucose-Capillary: 82 mg/dL (ref 70–99)
Glucose-Capillary: 88 mg/dL (ref 70–99)
Glucose-Capillary: 88 mg/dL (ref 70–99)
Glucose-Capillary: 94 mg/dL (ref 70–99)

## 2019-07-31 LAB — CULTURE, BLOOD (ROUTINE X 2)
Culture: NO GROWTH
Culture: NO GROWTH
Special Requests: ADEQUATE

## 2019-07-31 LAB — PHOSPHORUS: Phosphorus: 1.9 mg/dL — ABNORMAL LOW (ref 2.5–4.6)

## 2019-07-31 LAB — MAGNESIUM: Magnesium: 1.8 mg/dL (ref 1.7–2.4)

## 2019-07-31 MED ORDER — LIDOCAINE VISCOUS HCL 2 % MT SOLN
15.0000 mL | Freq: Once | OROMUCOSAL | Status: AC
  Administered 2019-07-31: 15 mL via OROMUCOSAL
  Filled 2019-07-31 (×2): qty 15

## 2019-07-31 MED ORDER — HYDRALAZINE HCL 25 MG PO TABS: 25.0000 mg | ORAL_TABLET | Freq: Four times a day (QID) | ORAL | Status: DC | PRN

## 2019-07-31 MED ORDER — SODIUM CHLORIDE 0.9 % IV SOLN: INTRAVENOUS | Status: DC

## 2019-07-31 MED ORDER — MAGNESIUM HYDROXIDE 400 MG/5ML PO SUSP: 30.0000 mL | Freq: Every day | ORAL | Status: DC | PRN

## 2019-07-31 MED ORDER — DEXTROSE-NACL 5-0.9 % IV SOLN: INTRAVENOUS | Status: DC

## 2019-07-31 NOTE — Progress Notes (Signed)
Notified by RN that patient reports pain on chewing food or opening the mouth to eat.  Patient has not reported to me or to the SLP therapist earlier today during SLP evaluation about pain in the mouth. As per my conversations with the patient, she reported not having an appetite to certain foods. As per the patient's sister , she hasn't had a good appetite and decreased oral intake since November after her stroke. She denies having nausea today. abd x ray earlier this am doe snot show any stool impaction or obstruction.  Meanwhile we will order lidocaine viscous for her mouth and see if the pain improves and plan for DG esophagogram for further evaluation.   Kathlen Mody, MD

## 2019-07-31 NOTE — Progress Notes (Signed)
Occupational Therapy Treatment Patient Details Name: Denise Santiago MRN: [id number] DOB: [birth year] Today's Date: 07/31/2019    History of present illness 64 y.o. female with a hx of DM, HTN, HLD, CVA 12/2018 & 03/2019, ongoing tob use who presented to ED with generalized weakness and elevated troponin, thought to be non specific by cardiology. pt reports weakness after COVID vaccine 07/16/19. The day prior to admission pt slid out of chair to floor and remained through the night.    OT comments  Patient continues to make progress towards goals in skilled OT session. Patient's session encompassed sitting EOB to aid in trunk control to promote increased independence. Pt noted to be lethargic and fatigue quickly in session, requiring increased assist to come to EOB and remain in upright posture once EOB. Pt reliant on external support to sit EOB, noting to lose balance when attempting to wipe face. Pt able to sit up for approximately 10-12 minutes before presenting with increased kyphotic posture and unable to right due to fatigue. Pt would continue to benefit from therapy to address functional deficits; will follow acutely.    Follow Up Recommendations  SNF    Equipment Recommendations  3 in 1 bedside commode;Wheelchair (measurements OT);Wheelchair cushion (measurements OT);Hospital bed    Recommendations for Other Services      Precautions / Restrictions Precautions Precautions: Fall Precaution Comments: reports falls since COVID vaccine       Mobility Bed Mobility Overal bed mobility: Needs Assistance Bed Mobility: Sit to Supine     Supine to sit: Max assist;+2 for physical assistance     General bed mobility comments: At EOB pt required multimodal cues to remain upright, often presenting with significant kyphotic posture  Transfers                 General transfer comment: Unable to attempt transfers due to assist needed to remain sitting upright EOB    Balance  Overall balance assessment: Needs assistance Sitting-balance support: Bilateral upper extremity supported;Feet supported Sitting balance-Leahy Scale: Poor Sitting balance - Comments: required mod A to maintain sitting EOB                                   ADL either performed or assessed with clinical judgement   ADL Overall ADL's : Needs assistance/impaired                                     Functional mobility during ADLs: Moderate assistance;Maximal assistance General ADL Comments: pt attempted to sit on EOB unsupported, denied completion of ADLs required mod A to remain upright EOB with multimodal cues     Vision       Perception     Praxis      Cognition Arousal/Alertness: Lethargic Behavior During Therapy: Flat affect Overall Cognitive Status: No family/caregiver present to determine baseline cognitive functioning Area of Impairment: Safety/judgement;Awareness;Following commands                     Memory: Decreased short-term memory Following Commands: Follows one step commands with increased time;Follows one step commands inconsistently Safety/Judgement: Decreased awareness of safety;Decreased awareness of deficits Awareness: Emergent Problem Solving: Slow processing;Difficulty sequencing;Decreased initiation          Exercises     Shoulder Instructions       General Comments  Pertinent Vitals/ Pain       Pain Assessment: No/denies pain  Home Living                                          Prior Functioning/Environment              Frequency  Min 2X/week        Progress Toward Goals  OT Goals(current goals can now be found in the care plan section)  Progress towards OT goals: Progressing toward goals  Acute Rehab OT Goals Patient Stated Goal: to get stronger OT Goal Formulation: With patient Time For Goal Achievement: 08/11/19 Potential to Achieve Goals: Fair  Plan  Discharge plan remains appropriate    Co-evaluation                 AM-PAC OT "6 Clicks" Daily Activity     Outcome Measure   Help from another person eating meals?: A Lot Help from another person taking care of personal grooming?: A Lot Help from another person toileting, which includes using toliet, bedpan, or urinal?: A Lot Help from another person bathing (including washing, rinsing, drying)?: A Lot Help from another person to put on and taking off regular upper body clothing?: A Lot Help from another person to put on and taking off regular lower body clothing?: Total 6 Click Score: 11    End of Session    OT Visit Diagnosis: Unsteadiness on feet (R26.81);Muscle weakness (generalized) (M62.81)   Activity Tolerance Patient limited by fatigue;Patient limited by lethargy   Patient Left in bed;with call bell/phone within reach   Nurse Communication Mobility status        Time: 8182-9937 OT Time Calculation (min): 16 min  Charges: OT General Charges $OT Visit: 1 Visit OT Treatments $Self Care/Home Management : 8-22 mins  Pollyann Glen E. Zelda Reames, COTA/L Acute Rehabilitation Services [phone]   Cherlyn Cushing 07/31/2019, 2:14 PM

## 2019-07-31 NOTE — Progress Notes (Signed)
Pt is progressively getting weaker refused meal Day shift RN noted pt was pocketing food in mouth & not swallowing well . NPO for SLP to evaluate .  However pt tolerated Po fluid thin liquid well. Pt stated had no BM x 2 weeks.  Constipation management initiated but no result from the laxatives given. Pt output is also  Low. Medical personnel on call made aware.  RN will continue to monitor pt 's intake and output.

## 2019-07-31 NOTE — Progress Notes (Signed)
Patient still can not chew or swallow food. She tried but looked like she was choking on the food. She had to spit everything she put in her mouth out. MD Jerrye Noble notified of patient inability to eat. Will continue to monitor.

## 2019-07-31 NOTE — Evaluation (Signed)
Clinical/Bedside Swallow Evaluation Patient Details  Name: Denise Santiago MRN: [id number] Date of Birth: 1955-07-06  Today's Date: 07/31/2019 Time: SLP Start Time (ACUTE ONLY): 1000 SLP Stop Time (ACUTE ONLY): 1010 SLP Time Calculation (min) (ACUTE ONLY): 10 min  Past Medical History:  Past Medical History:  Diagnosis Date  . CVA (cerebral vascular accident) (HCC) 12/2018   And 03/2019, loop recorder inserted 03/2019  . Diabetes mellitus without complication (HCC)   . Hyperlipidemia   . Hypertension   . Obesity    Past Surgical History:  Past Surgical History:  Procedure Laterality Date  . ABDOMINAL HYSTERECTOMY    . BUBBLE STUDY  03/20/2019   Procedure: BUBBLE STUDY;  Surgeon: Wendall Stade, MD;  Location: [facility] ENDOSCOPY;  Service: Cardiovascular;;  . LOOP RECORDER INSERTION N/A 03/20/2019   Procedure: LOOP RECORDER INSERTION;  Surgeon: Regan Lemming, MD;  Location: [facility] INVASIVE CV LAB;  Service: Cardiovascular;  Laterality: N/A;  . TEE WITHOUT CARDIOVERSION N/A 03/20/2019   Procedure: TRANSESOPHAGEAL ECHOCARDIOGRAM (TEE);  Surgeon: Wendall Stade, MD;  Location: [facility] ENDOSCOPY;  Service: Cardiovascular;  Laterality: N/A;   HPI:  64 year old woman with prior history of hypertension, diabetes, hyperlipidemia, CVA presents to ED for generalized weakness and fall.  Probably secondary to electrolyte abnormalities per MD. MRI shows chronic cortically based infarct within the posterior right cerebral hemisphere predominantly affecting the right temporal and occipital lobes and with minimal involvement of the adjacent right parietal lobe. Redemonstrated chronic cortically based infarct within right frontal operculum. Stable background chronic small vessel ischemic disease with chronic lacunar infarcts in the right corona radiata and bilateral basal ganglia.   Assessment / Plan / Recommendation Clinical Impression  SLP ordered due to report that pt has been orally holding solids.  When asked why, she explained that she was eating pot roast and mashed potatoes and she wasnt hungry and felt disgusted by food. SLP observed pt consume thins and purees without difficulty, but when given a cracker she had a terrible look on her face and verbalized several complaints about the taste and texture of the food. She did fully masticate and swallow with verbal cues. SLP offered the pt choices, either attempt a puree/full liquid diet, or allow pt to choose foods that seemed appetizing to her from the menu. Pt wanted to choose her foods and the used the menu independently to choose soup and fruit. Assisted pt in making the order and set diet order to assist for ordering meals. She may continue to hold her food regardless of choice, but making her NPO is not necessary as she can still tolerate liquids. Will f/u for needs. Question if constipation could be impacting pts appetite? SLP Visit Diagnosis: Dysphagia, unspecified (R13.10)    Aspiration Risk  Severe aspiration risk    Diet Recommendation Regular;Thin liquid   Liquid Administration via: Cup;Straw Medication Administration: Crushed with puree Supervision: Staff to assist with self feeding Compensations: Slow rate;Small sips/bites    Other  Recommendations Oral Care Recommendations: Oral care BID   Follow up Recommendations 24 hour supervision/assistance      Frequency and Duration min 2x/week  2 weeks       Prognosis        Swallow Study   General HPI: 64 year old woman with prior history of hypertension, diabetes, hyperlipidemia, CVA presents to ED for generalized weakness and fall.  Probably secondary to electrolyte abnormalities per MD. MRI shows chronic cortically based infarct within the posterior right cerebral hemisphere predominantly affecting the right temporal  and occipital lobes and with minimal involvement of the adjacent right parietal lobe. Redemonstrated chronic cortically based infarct within right frontal  operculum. Stable background chronic small vessel ischemic disease with chronic lacunar infarcts in the right corona radiata and bilateral basal ganglia. Type of Study: Bedside Swallow Evaluation Previous Swallow Assessment: none Diet Prior to this Study: NPO Temperature Spikes Noted: No Respiratory Status: Room air History of Recent Intubation: No Behavior/Cognition: Alert;Cooperative;Pleasant mood Oral Cavity Assessment: Within Functional Limits Oral Care Completed by SLP: No Oral Cavity - Dentition: Missing dentition Vision: Functional for self-feeding Self-Feeding Abilities: Able to feed self Patient Positioning: Upright in bed Baseline Vocal Quality: Normal Volitional Cough: Strong Volitional Swallow: Able to elicit    Oral/Motor/Sensory Function Overall Oral Motor/Sensory Function: Other (comment)(severe underbite, some difficulty sealing lips on OME)   Ice Chips Ice chips: Not tested   Thin Liquid Thin Liquid: Within functional limits    Nectar Thick Nectar Thick Liquid: Not tested   Honey Thick Honey Thick Liquid: Not tested   Puree Puree: Within functional limits   Solid     Solid: Impaired Oral Phase Functional Implications: Oral holding      Saher Davee, Katherene Ponto 07/31/2019,1:50 PM

## 2019-07-31 NOTE — Progress Notes (Signed)
PROGRESS NOTE    Denise Santiago  IRS:[id number] DOB: 10-07-1955 DOA: 07/26/2019 PCP: Seward Carol, MD    Brief Narrative:   64 year old woman with prior history of hypertension, diabetes, hyperlipidemia, CVA presents to ED for generalized weakness and fall.  On arrival to ED her labs were abnormal for elevated troponin, hypokalemia and hypomagnesemia.  Electrolytes were all repleted and therapy evaluations are recommending SNF at this time.  As per RN patient refused meals yesterday and started pocketing food in the mouth and was not swallowing appropriately she was made n.p.o. and SLP evaluation ordered.  Patient also constipated and had no BM bowel movement for more than 10 days now.  MRI of the brain was done yesterday and results were discussed with the patient.  MRI brain shows No evidence of acute intracranial abnormality. Stable chronic ischemic changes including multifocal chronic cortically based infarcts within the right cerebral hemisphere and multiple chronic small vessel lacunar infarcts. Mild ethmoid sinus mucosal thickening.   Assessment & Plan:   Principal Problem:   Generalized weakness Active Problems:   Essential hypertension   Type 2 diabetes mellitus without complication (HCC)   CVA (cerebral vascular accident) ([place])   Hyperlipidemia   Hypokalemia   Hyponatremia   Acute on chronic kidney failure (HCC)   Thrombocytopenia (HCC)   Elevated troponin   Lactic acidosis   AKI (acute kidney injury) ([place])   Generalized weakness and fall Probably secondary to electrolyte abnormalities and general deconditioning due to multiple strokes in the past.  Initial CT of the head shows old right posterior parietal infarct without any acute intracranial abnormalities. MRI brain shows No evidence of acute intracranial abnormality. Stable chronic ischemic changes including multifocal chronic cortically based infarcts within the right cerebral hemisphere and multiple chronic small  vessel lacunar infarcts. As per the family patient has bouts of confusion and times of lucidity.   PT evaluation recommending CIR but as patient does not have enough support/caregiver assistance at home and family is inclined towards SNF as per inpatient rehab admission coordinator.  TOC consulted for SNF placement at this time.   Pocketing food , adult failure to thrive:  NPO now, and SLP evaluation ordered.   AKI superimposed on stage II CKD Creatinine improving with hydration.  Baseline creatinine appears to be around 1.1.  Continue to monitor. Gentle hydration.    Essential hypertension Suboptimally controlled.  Hydralazine ordered.     Type 2 diabetes mellitus controlled.  Hemoglobin A1c is 6.9. CBG (last 3)  Recent Labs    07/30/19 2119 07/31/19 0050 07/31/19 0415  GLUCAP 80 88 88  CBGs are well controlled continue with sliding scale insulin.    History of recent CVA Continue with Plavix and statin.    Hyperlipidemia Continue with statin.    Lactic acidosis In the setting of dehydration. Resolved   UTI:  Urine culture growing 80,000 E coli, started the patient on IV Rocephin, D/C IV rocephin after today's dose.    Chronic thrombocytopenia Platelets have been low in the past . No obvious signs of bleeding.    Constipation  No BM after the stool softeners. Will add MOM.       DVT prophylaxis: SCDs Code Status: DNR Family Communication: None at bedside, called family on the phone but could not leave a message. Disposition Plan:  . Patient came from: Home            . Anticipated d/c place: SNF . Barriers to d/c OR conditions which need to  be met to effect a safe d/c: Ongoing treatment for urinary tract infection and generalized weakness and work-up with an MRI of the brain pending   Consultants:   CIR   Procedures: Echocardiogram.    Antimicrobials: ROCEPHIN.    Subjective: She denies any nausea, vomiting or abdominal pain. No BM  today.  As per sister over the phone, patient has no appetite and has not been eating well since November and requesting for a palliative care consult for goals of care.  Objective: Vitals:   07/30/19 2259 07/31/19 0029 07/31/19 0416 07/31/19 0822  BP:  (!) 156/92 (!) 145/69 (!) 163/82  Pulse:  80 75 76  Resp:  14 16 20   Temp:  97.9 F (36.6 C) 98.4 F (36.9 C) 97.8 F (36.6 C)  TempSrc:  Oral Oral Oral  SpO2: 98% 100% 100% 99%  Weight:   81.1 kg     Intake/Output Summary (Last 24 hours) at 07/31/2019 0828 Last data filed at 07/31/2019 0500 Gross per 24 hour  Intake 2144.93 ml  Output 300 ml  Net 1844.93 ml   Filed Weights   07/27/19 0450 07/28/19 0500 07/31/19 0416  Weight: 83.1 kg 81.6 kg 81.1 kg    Examination:  General exam: alert and comfortable, not in any distress.  Respiratory system: clear to auscultation bilaterally, no wheezing or rhonchi.  Cardiovascular system: S1S2 [place], RRR, no JVD  Gastrointestinal system:  Abdomen is soft NT ND, BS+ Central nervous system: alert and following commands and appears to be oriented,  Extremities: No pedal edema. Or cyanosis.  Skin: No rashes seen.  psychiatry: Flat affect.     Data Reviewed: I have personally reviewed following labs and imaging studies  CBC: Recent Labs  Lab 07/26/19 0810 07/26/19 1709 07/27/19 0410 07/28/19 0358 07/29/19 0351  WBC 4.7 5.4 4.7 5.7 4.8  NEUTROABS  --   --   --   --  3.4  HGB 15.0 16.2* 14.4 13.9 14.1  HCT 44.3 46.7* 42.0 40.2 41.5  MCV 88.8 88.4 89.0 88.7 89.1  PLT 101* 88* 97* 97* 572*   Basic Metabolic Panel: Recent Labs  Lab 07/26/19 0810 07/26/19 0810 07/26/19 1709 07/27/19 0410 07/28/19 0358 07/29/19 0351 07/30/19 0754  NA 134*  --   --  137 139 137 139  K 2.9*  --   --  3.3* 4.1 4.6 4.9  CL 94*  --   --  99 107 107 109  CO2 27  --   --  26 20* 20* 20*  GLUCOSE 136*  --   --  127* 102* 104* 104*  BUN 28*  --   --  20 17 14 17   CREATININE 1.38*   < > 1.04*  1.17* 1.23* 1.11* 1.18*  CALCIUM 9.5  --   --  9.1 8.9 8.9 8.9  MG  --   --  2.7*  --   --   --  2.0   < > = values in this interval not displayed.   GFR: Estimated Creatinine Clearance: 47.5 mL/min (A) (by C-G formula based on SCr of 1.18 mg/dL (H)). Liver Function Tests: Recent Labs  Lab 07/26/19 0810  AST 40  ALT 27  ALKPHOS 64  BILITOT 1.4*  PROT 7.7  ALBUMIN 3.5   Recent Labs  Lab 07/26/19 0810  LIPASE 24   No results for input(s): AMMONIA in the last 168 hours. Coagulation Profile: No results for input(s): INR, PROTIME in the last 168 hours. Cardiac Enzymes:  Recent Labs  Lab 07/26/19 1709 07/27/19 0410  CKTOTAL 429* 223  CKMB 22.3*  --    BNP (last 3 results) No results for input(s): PROBNP in the last 8760 hours. HbA1C: No results for input(s): HGBA1C in the last 72 hours. CBG: Recent Labs  Lab 07/30/19 1207 07/30/19 1618 07/30/19 2119 07/31/19 0050 07/31/19 0415  GLUCAP 106* 88 80 88 88   Lipid Profile: No results for input(s): CHOL, HDL, LDLCALC, TRIG, CHOLHDL, LDLDIRECT in the last 72 hours. Thyroid Function Tests: No results for input(s): TSH, T4TOTAL, FREET4, T3FREE, THYROIDAB in the last 72 hours. Anemia Panel: No results for input(s): VITAMINB12, FOLATE, FERRITIN, TIBC, IRON, RETICCTPCT in the last 72 hours. Sepsis Labs: Recent Labs  Lab 07/26/19 1104 07/26/19 1843 07/26/19 2156  LATICACIDVEN 3.0* 1.8 1.8    Recent Results (from the past 240 hour(s))  Culture, blood (Routine X 2) w Reflex to ID Panel     Status: None (Preliminary result)   Collection Time: 07/26/19 11:04 AM   Specimen: BLOOD LEFT HAND  Result Value Ref Range Status   Specimen Description BLOOD LEFT HAND  Final   Special Requests   Final    BOTTLES DRAWN AEROBIC AND ANAEROBIC Blood Culture adequate volume   Culture   Final    NO GROWTH 4 DAYS Performed at [facility], [address], [place], [place] [address]    Report Status PENDING  Incomplete  SARS  CORONAVIRUS 2 (TAT 6-24 HRS) Nasopharyngeal Nasopharyngeal Swab     Status: None   Collection Time: 07/26/19 12:44 PM   Specimen: Nasopharyngeal Swab  Result Value Ref Range Status   SARS Coronavirus 2 NEGATIVE NEGATIVE Final    Comment: (NOTE) SARS-CoV-2 target nucleic acids are NOT DETECTED. The SARS-CoV-2 RNA is generally detectable in upper and lower respiratory specimens during the acute phase of infection. Negative results do not preclude SARS-CoV-2 infection, do not rule out co-infections with other pathogens, and should not be used as the sole basis for treatment or other patient management decisions. Negative results must be combined with clinical observations, patient history, and epidemiological information. The expected result is Negative. Fact Sheet for Patients: SugarRoll.be Fact Sheet for Healthcare Providers: https://www.woods-mathews.com/ This test is not yet approved or cleared by the [place] FDA and  has been authorized for detection and/or diagnosis of SARS-CoV-2 by FDA under an Emergency Use Authorization (EUA). This EUA will remain  in effect (meaning this test can be used) for the duration of the COVID-19 declaration under Section 56 4(b)(1) of the Act, 21 U.S.C. section 360bbb-3(b)(1), unless the authorization is terminated or revoked sooner. Performed at [facility], [place] [address], [place], [place] [address]   Culture, blood (Routine X 2) w Reflex to ID Panel     Status: None (Preliminary result)   Collection Time: 07/26/19  5:09 PM   Specimen: BLOOD LEFT HAND  Result Value Ref Range Status   Specimen Description BLOOD LEFT HAND  Final   Special Requests   Final    BOTTLES DRAWN AEROBIC ONLY Blood Culture results may not be optimal due to an inadequate volume of blood received in culture bottles   Culture   Final    NO GROWTH 4 DAYS Performed at [facility], [place] [address], [place], [place]  [address]    Report Status PENDING  Incomplete  Urine Culture     Status: Abnormal   Collection Time: 07/27/19  6:20 AM   Specimen:  Urine, Random  Result Value Ref Range Status   Specimen Description URINE, RANDOM  Final   Special Requests   Final    NONE Performed at [facility], [address], [place], [place] [address]    Culture 80,000 COLONIES/mL ESCHERICHIA COLI (A)  Final   Report Status 07/29/2019 FINAL  Final   Organism ID, Bacteria ESCHERICHIA COLI (A)  Final      Susceptibility   Escherichia coli - MIC*    AMPICILLIN <=2 SENSITIVE Sensitive     CEFAZOLIN <=4 SENSITIVE Sensitive     CEFTRIAXONE <=0.25 SENSITIVE Sensitive     CIPROFLOXACIN <=0.25 SENSITIVE Sensitive     GENTAMICIN <=1 SENSITIVE Sensitive     IMIPENEM <=0.25 SENSITIVE Sensitive     NITROFURANTOIN <=16 SENSITIVE Sensitive     TRIMETH/SULFA <=20 SENSITIVE Sensitive     AMPICILLIN/SULBACTAM <=2 SENSITIVE Sensitive     * 80,000 COLONIES/mL ESCHERICHIA COLI         Radiology Studies: DG Abd 1 View  Result Date: 07/30/2019 CLINICAL DATA:  Constipation, back pain EXAM: ABDOMEN - 1 VIEW COMPARISON:  None. FINDINGS: Mild gaseous distension of bowel, predominantly colon. No evidence of bowel obstruction or free air. No suspicious calcification. No acute bony abnormality. IMPRESSION: No evidence of bowel obstruction or free air. Mild gaseous distention of the colon. Electronically Signed   By: Rolm Baptise M.D.   On: 07/30/2019 21:20   MR BRAIN WO CONTRAST  Result Date: 07/30/2019 CLINICAL DATA:  Encephalopathy. Additional history provided: 64 year old female with history of diabetes mellitus, hypertension, hyperlipidemia, CVA August August 2020 and November 2020, who presented to the emergency department for generalized weakness and elevated troponin. EXAM: MRI HEAD WITHOUT CONTRAST TECHNIQUE: Multiplanar, multiecho pulse sequences of the brain and surrounding structures were obtained without intravenous  contrast. COMPARISON:  Head CT 07/26/2019, brain MRI 06/14/2019 FINDINGS: Brain: Mild intermittent motion degradation. There is no evidence of acute infarct. No evidence of intracranial mass. No midline shift or extra-axial fluid collection. Redemonstrated chronic cortically based infarct within the posterior right cerebral hemisphere predominantly affecting the right temporal and occipital lobes and with minimal involvement of the adjacent right parietal lobe. Redemonstrated chronic cortically based infarct within right frontal operculum. Stable background chronic small vessel ischemic disease with chronic lacunar infarcts in the right corona radiata and bilateral basal ganglia. As before, there is chronic hemosiderin deposition associated with the posterior right cerebral hemisphere appear chronic infarct and at sites of chronic lacunar infarcts involving the bilateral caudate heads. Cerebral volume is normal for age. Vascular: Flow voids maintained within the proximal large arterial vessels. Skull and upper cervical spine: No focal marrow lesion. Incompletely assessed cervical spondylosis. Sinuses/Orbits: Visualized orbits demonstrate no acute abnormality. Mild ethmoid sinus mucosal thickening. No significant mastoid effusion. IMPRESSION: No evidence of acute intracranial abnormality. Stable chronic ischemic changes including multifocal chronic cortically based infarcts within the right cerebral hemisphere and multiple chronic small vessel lacunar infarcts. Mild ethmoid sinus mucosal thickening. Electronically Signed   By: Kellie Simmering DO   On: 07/30/2019 18:15        Scheduled Meds: . atorvastatin  80 mg Oral Daily  . clopidogrel  75 mg Oral Daily  . insulin aspart  0-15 Units Subcutaneous TID WC  . insulin aspart  0-5 Units Subcutaneous QHS  . latanoprost  1 drop Both Eyes QHS  . nicotine  21 mg Transdermal Daily  . polyethylene glycol  17 g Oral BID  . senna  1 tablet  Oral QHS  . sodium  chloride flush  3 mL Intravenous Once   Continuous Infusions: . sodium chloride 50 mL/hr at 07/31/19 0256  . cefTRIAXone (ROCEPHIN)  IV 1 g (07/30/19 1442)     LOS: 4 days        Hosie Poisson, MD [facility]   To contact the attending provider between 7A-7P or the covering provider during after hours 7P-7A, please log into the web site [email or web address] and access using universal [place] password for that web site. If you do not have the password, please call the hospital operator.  07/31/2019, 8:28 AM

## 2019-08-01 ENCOUNTER — Inpatient Hospital Stay (HOSPITAL_COMMUNITY): Payer: Self-pay

## 2019-08-01 DIAGNOSIS — F320 Major depressive disorder, single episode, mild: Secondary | ICD-10-CM

## 2019-08-01 DIAGNOSIS — F329 Major depressive disorder, single episode, unspecified: Secondary | ICD-10-CM

## 2019-08-01 LAB — BASIC METABOLIC PANEL
Anion gap: 11 (ref 5–15)
BUN: 12 mg/dL (ref 8–23)
CO2: 20 mmol/L — ABNORMAL LOW (ref 22–32)
Calcium: 8.7 mg/dL — ABNORMAL LOW (ref 8.9–10.3)
Chloride: 108 mmol/L (ref 98–111)
Creatinine, Ser: 1.01 mg/dL — ABNORMAL HIGH (ref 0.44–1.00)
GFR calc Af Amer: >60 mL/min (ref >60)
GFR calc non Af Amer: 59 mL/min — ABNORMAL LOW (ref >60)
Glucose, Bld: 121 mg/dL — ABNORMAL HIGH (ref 70–99)
Potassium: 4 mmol/L (ref 3.5–5.1)
Sodium: 139 mmol/L (ref 135–145)

## 2019-08-01 LAB — CBC
HCT: 43.5 % (ref 36.0–46.0)
Hemoglobin: 14.5 g/dL (ref 12.0–15.0)
MCH: 30.5 pg (ref 26.0–34.0)
MCHC: 33.3 g/dL (ref 30.0–36.0)
MCV: 91.4 fL (ref 80.0–100.0)
Platelets: 101 10*3/uL — ABNORMAL LOW (ref 150–400)
RBC: 4.76 MIL/uL (ref 3.87–5.11)
RDW: 13.9 % (ref 11.5–15.5)
WBC: 4.8 10*3/uL (ref 4.0–10.5)
nRBC: 0 % (ref 0.0–0.2)

## 2019-08-01 LAB — GLUCOSE, CAPILLARY
Glucose-Capillary: 109 mg/dL — ABNORMAL HIGH (ref 70–99)
Glucose-Capillary: 127 mg/dL — ABNORMAL HIGH (ref 70–99)
Glucose-Capillary: 90 mg/dL (ref 70–99)
Glucose-Capillary: 101 mg/dL — ABNORMAL HIGH (ref 70–99)

## 2019-08-01 MED ORDER — HEPARIN (PORCINE) 25000 UT/250ML-% IV SOLN
1000.0000 [IU]/h | INTRAVENOUS | Status: DC
  Administered 2019-08-01: 1100 [IU]/h via INTRAVENOUS
  Filled 2019-08-01: qty 250

## 2019-08-01 MED ORDER — HEPARIN BOLUS VIA INFUSION
4100.0000 [IU] | Freq: Once | INTRAVENOUS | Status: AC
  Administered 2019-08-01: 4100 [IU] via INTRAVENOUS
  Filled 2019-08-01: qty 4100

## 2019-08-01 MED ORDER — PIPERACILLIN-TAZOBACTAM 3.375 G IVPB
3.3750 g | Freq: Three times a day (TID) | INTRAVENOUS | Status: DC
  Administered 2019-08-01 – 2019-08-04 (×9): 3.375 g via INTRAVENOUS
  Filled 2019-08-01 (×9): qty 50

## 2019-08-01 MED ORDER — IOHEXOL 9 MG/ML PO SOLN
500.0000 mL | ORAL | Status: AC
  Administered 2019-08-01: 500 mL via ORAL

## 2019-08-01 MED ORDER — SODIUM PHOSPHATES 45 MMOLE/15ML IV SOLN
30.0000 mmol | Freq: Once | INTRAVENOUS | Status: AC
  Administered 2019-08-01: 30 mmol via INTRAVENOUS
  Filled 2019-08-01: qty 10

## 2019-08-01 MED ORDER — TRAZODONE HCL 100 MG PO TABS
100.0000 mg | ORAL_TABLET | Freq: Every day | ORAL | Status: DC
  Administered 2019-08-01 – 2019-08-07 (×7): 100 mg via ORAL
  Filled 2019-08-01 (×7): qty 1

## 2019-08-01 MED ORDER — IOHEXOL 300 MG/ML  SOLN
100.0000 mL | Freq: Once | INTRAMUSCULAR | Status: AC | PRN
  Administered 2019-08-01: 100 mL via INTRAVENOUS

## 2019-08-01 NOTE — Progress Notes (Addendum)
PROGRESS NOTE    Denise Santiago  VZD:[id number] DOB: 02-04-1956 DOA: 07/26/2019 PCP: Seward Carol, MD    Brief Narrative:   65 year old woman with prior history of hypertension, diabetes, hyperlipidemia, CVA presents to ED for generalized weakness and fall.  On arrival to ED her labs were abnormal for elevated troponin, hypokalemia and hypomagnesemia.  Electrolytes were all repleted and therapy evaluations are recommending SNF at this time.  As per RN , pt has refused solid food as she is unable to chew food due to lack of upper dentures. SLP eval suggested regular diet with thin liquids.  MRI of the brain was done yesterday and results were discussed with the patient.  MRI brain shows No evidence of acute intracranial abnormality. Stable chronic ischemic changes including multifocal chronic cortically based infarcts within the right cerebral hemisphere and multiple chronic small vessel lacunar infarcts. Mild ethmoid sinus mucosal thickening.  As per the sister patient has lost more than 50 pounds since November after her last stroke.  Patient reports her taste is altered now and she does not have any appetite. Patient seen and examined at bedside currently denies any nausea vomiting or abdominal pain and still did not have a bowel movement so far.  Assessment & Plan:   Principal Problem:   Major depressive disorder Active Problems:   Essential hypertension   Type 2 diabetes mellitus without complication (HCC)   CVA (cerebral vascular accident) ([place])   Hyperlipidemia   Generalized weakness   Hypokalemia   Hyponatremia   Acute on chronic kidney failure (HCC)   Thrombocytopenia (HCC)   Elevated troponin   Lactic acidosis   AKI (acute kidney injury) ([place])   Generalized weakness and fall Probably secondary to electrolyte abnormalities and general deconditioning due to multiple strokes in the past.  Initial CT of the head shows old right posterior parietal infarct without any acute  intracranial abnormalities. MRI brain shows No evidence of acute intracranial abnormality. Stable chronic ischemic changes including multifocal chronic cortically based infarcts within the right cerebral hemisphere and multiple chronic small vessel lacunar infarcts. PT evaluation recommending CIR but as patient does not have enough support/caregiver assistance at home and family is inclined towards SNF as per inpatient rehab admission coordinator.  TOC consulted for SNF placement at this time. Due to lack of placement at SNF has been difficult.   Adult failure to thrive Probably secondary to deconditioning and poor appetite. Differential include depression post stroke. Psychiatry consulted for further evaluation of depression.  She will be started on trazodone at night. Further evaluation for malignancy ordered, with tumor markers and CT of the abdomen and chest ordered.  AKI superimposed on stage II CKD Creatinine improving with hydration.  Baseline creatinine appears to be around 1.1.  Creatinine appears to be at baseline at this time.   Essential hypertension Blood pressure parameters slightly high.  As needed hydralazine ordered    Type 2 diabetes mellitus controlled.  Hemoglobin A1c is 6.9. CBG (last 3)  Recent Labs    08/01/19 0357 08/01/19 0747 08/01/19 1141  GLUCAP 109* 127* 90  CBGs are well controlled, continue with sliding scale insulin.    History of recent CVA Continue with Plavix and statin    Hyperlipidemia Continue with statin.    Lactic acidosis In the setting of dehydration. Resolved   UTI:  Completed the course of IV Rocephin.   Chronic thrombocytopenia Platelets have been low in the past . No obvious signs of bleeding.    Constipation  If no bowel movement by the end of the day we will do a trial of enema.      DVT prophylaxis: SCDs Code Status: DNR Family Communication: None at bedside called sister and updated. Disposition Plan:   . Patient came from: Home            . Anticipated d/c place: SNF . Barriers to d/c OR conditions which need to be met to effect a safe d/c: TOC consult in place for SNF placement, difficult placement due to lack of insurance.   Consultants:   CIR   Procedures: Echocardiogram.    Antimicrobials: ROCEPHIN.    Subjective: Patient denies any chest pain, shortness of breath, nausea, vomiting or abdominal pain.  Objective: Vitals:   07/31/19 2023 07/31/19 2313 08/01/19 0342 08/01/19 0500  BP: (!) 160/87 (!) 157/92 (!) 162/83   Pulse: 79 87 73   Resp:  16 16   Temp: 97.7 F (36.5 C) 97.6 F (36.4 C) 98 F (36.7 C)   TempSrc: Oral Oral Oral   SpO2: 100% 98% 100%   Weight:    81.6 kg    Intake/Output Summary (Last 24 hours) at 08/01/2019 1416 Last data filed at 08/01/2019 0600 Gross per 24 hour  Intake 732.56 ml  Output 200 ml  Net 532.56 ml   Filed Weights   07/28/19 0500 07/31/19 0416 08/01/19 0500  Weight: 81.6 kg 81.1 kg 81.6 kg    Examination:  General exam: Alert and comfortable not in any kind of distress. Respiratory system: Clear to auscultation bilaterally, no wheezing or rhonchi Cardiovascular system: S1-S2 heard, regular rate rhythm, no JVD. Gastrointestinal system: Abdomen is soft, nontender, nondistended, bowel sounds are normal Central nervous system:.  Alert and answering questions and following commands. Extremities: No pedal edema or cyanosis Skin: No rashes seen psychiatry: Flat affect   Data Reviewed: I have personally reviewed following labs and imaging studies  CBC: Recent Labs  Lab 07/26/19 1709 07/27/19 0410 07/28/19 0358 07/29/19 0351 08/01/19 0545  WBC 5.4 4.7 5.7 4.8 4.8  NEUTROABS  --   --   --  3.4  --   HGB 16.2* 14.4 13.9 14.1 14.5  HCT 46.7* 42.0 40.2 41.5 43.5  MCV 88.4 89.0 88.7 89.1 91.4  PLT 88* 97* 97* 110* 295*   Basic Metabolic Panel: Recent Labs  Lab 07/26/19 1709 07/27/19 0410 07/28/19 0358 07/29/19 0351  07/30/19 0754 07/31/19 1847 08/01/19 0545  NA  --    < > 139 137 139 140 139  K  --    < > 4.1 4.6 4.9 4.2 4.0  CL  --    < > 107 107 109 108 108  CO2  --    < > 20* 20* 20* 22 20*  GLUCOSE  --    < > 102* 104* 104* 84 121*  BUN  --    < > 17 14 17 15 12   CREATININE 1.04*   < > 1.23* 1.11* 1.18* 0.96 1.01*  CALCIUM  --    < > 8.9 8.9 8.9 9.0 8.7*  MG 2.7*  --   --   --  2.0 1.8  --   PHOS  --   --   --   --   --  1.9*  --    < > = values in this interval not displayed.   GFR: Estimated Creatinine Clearance: 55.7 mL/min (A) (by C-G formula based on SCr of 1.01 mg/dL (H)). Liver Function Tests: Recent  Labs  Lab 07/26/19 0810  AST 40  ALT 27  ALKPHOS 64  BILITOT 1.4*  PROT 7.7  ALBUMIN 3.5   Recent Labs  Lab 07/26/19 0810  LIPASE 24   No results for input(s): AMMONIA in the last 168 hours. Coagulation Profile: No results for input(s): INR, PROTIME in the last 168 hours. Cardiac Enzymes: Recent Labs  Lab 07/26/19 1709 07/27/19 0410  CKTOTAL 429* 223  CKMB 22.3*  --    BNP (last 3 results) No results for input(s): PROBNP in the last 8760 hours. HbA1C: No results for input(s): HGBA1C in the last 72 hours. CBG: Recent Labs  Lab 07/31/19 1622 07/31/19 2057 08/01/19 0357 08/01/19 0747 08/01/19 1141  GLUCAP 77 94 109* 127* 90   Lipid Profile: No results for input(s): CHOL, HDL, LDLCALC, TRIG, CHOLHDL, LDLDIRECT in the last 72 hours. Thyroid Function Tests: No results for input(s): TSH, T4TOTAL, FREET4, T3FREE, THYROIDAB in the last 72 hours. Anemia Panel: No results for input(s): VITAMINB12, FOLATE, FERRITIN, TIBC, IRON, RETICCTPCT in the last 72 hours. Sepsis Labs: Recent Labs  Lab 07/26/19 1104 07/26/19 1843 07/26/19 2156  LATICACIDVEN 3.0* 1.8 1.8    Recent Results (from the past 240 hour(s))  Culture, blood (Routine X 2) w Reflex to ID Panel     Status: None   Collection Time: 07/26/19 11:04 AM   Specimen: BLOOD LEFT HAND  Result Value Ref Range  Status   Specimen Description BLOOD LEFT HAND  Final   Special Requests   Final    BOTTLES DRAWN AEROBIC AND ANAEROBIC Blood Culture adequate volume   Culture   Final    NO GROWTH 5 DAYS Performed at [facility], [address], [place], [place] [address]    Report Status 07/31/2019 FINAL  Final  SARS CORONAVIRUS 2 (TAT 6-24 HRS) Nasopharyngeal Nasopharyngeal Swab     Status: None   Collection Time: 07/26/19 12:44 PM   Specimen: Nasopharyngeal Swab  Result Value Ref Range Status   SARS Coronavirus 2 NEGATIVE NEGATIVE Final    Comment: (NOTE) SARS-CoV-2 target nucleic acids are NOT DETECTED. The SARS-CoV-2 RNA is generally detectable in upper and lower respiratory specimens during the acute phase of infection. Negative results do not preclude SARS-CoV-2 infection, do not rule out co-infections with other pathogens, and should not be used as the sole basis for treatment or other patient management decisions. Negative results must be combined with clinical observations, patient history, and epidemiological information. The expected result is Negative. Fact Sheet for Patients: SugarRoll.be Fact Sheet for Healthcare Providers: https://www.woods-mathews.com/ This test is not yet approved or cleared by the [place] FDA and  has been authorized for detection and/or diagnosis of SARS-CoV-2 by FDA under an Emergency Use Authorization (EUA). This EUA will remain  in effect (meaning this test can be used) for the duration of the COVID-19 declaration under Section 56 4(b)(1) of the Act, 21 U.S.C. section 360bbb-3(b)(1), unless the authorization is terminated or revoked sooner. Performed at [facility], [place] [address] [place], [place] [address]   Culture, blood (Routine X 2) w Reflex to ID Panel     Status: None   Collection Time: 07/26/19  5:09 PM   Specimen: BLOOD LEFT HAND  Result Value Ref Range Status   Specimen Description  BLOOD LEFT HAND  Final   Special Requests   Final    BOTTLES DRAWN AEROBIC ONLY Blood Culture results may not be optimal due to an inadequate volume of blood  received in culture bottles   Culture   Final    NO GROWTH 5 DAYS Performed at [facility], [place] [address], [place], [place] [address]    Report Status 07/31/2019 FINAL  Final  Urine Culture     Status: Abnormal   Collection Time: 07/27/19  6:20 AM   Specimen: Urine, Random  Result Value Ref Range Status   Specimen Description URINE, RANDOM  Final   Special Requests   Final    NONE Performed at [facility], [place] [address] [place], [place] [address]    Culture 80,000 COLONIES/mL ESCHERICHIA COLI (A)  Final   Report Status 07/29/2019 FINAL  Final   Organism ID, Bacteria ESCHERICHIA COLI (A)  Final      Susceptibility   Escherichia coli - MIC*    AMPICILLIN <=2 SENSITIVE Sensitive     CEFAZOLIN <=4 SENSITIVE Sensitive     CEFTRIAXONE <=0.25 SENSITIVE Sensitive     CIPROFLOXACIN <=0.25 SENSITIVE Sensitive     GENTAMICIN <=1 SENSITIVE Sensitive     IMIPENEM <=0.25 SENSITIVE Sensitive     NITROFURANTOIN <=16 SENSITIVE Sensitive     TRIMETH/SULFA <=20 SENSITIVE Sensitive     AMPICILLIN/SULBACTAM <=2 SENSITIVE Sensitive     * 80,000 COLONIES/mL ESCHERICHIA COLI         Radiology Studies: DG Abd 1 View  Result Date: 07/30/2019 CLINICAL DATA:  Constipation, back pain EXAM: ABDOMEN - 1 VIEW COMPARISON:  None. FINDINGS: Mild gaseous distension of bowel, predominantly colon. No evidence of bowel obstruction or free air. No suspicious calcification. No acute bony abnormality. IMPRESSION: No evidence of bowel obstruction or free air. Mild gaseous distention of the colon. Electronically Signed   By: Rolm Baptise M.D.   On: 07/30/2019 21:20   MR BRAIN WO CONTRAST  Result Date: 07/30/2019 CLINICAL DATA:  Encephalopathy. Additional history provided: 64 year old female with history of diabetes mellitus, hypertension,  hyperlipidemia, CVA August August 2020 and November 2020, who presented to the emergency department for generalized weakness and elevated troponin. EXAM: MRI HEAD WITHOUT CONTRAST TECHNIQUE: Multiplanar, multiecho pulse sequences of the brain and surrounding structures were obtained without intravenous contrast. COMPARISON:  Head CT 07/26/2019, brain MRI 06/14/2019 FINDINGS: Brain: Mild intermittent motion degradation. There is no evidence of acute infarct. No evidence of intracranial mass. No midline shift or extra-axial fluid collection. Redemonstrated chronic cortically based infarct within the posterior right cerebral hemisphere predominantly affecting the right temporal and occipital lobes and with minimal involvement of the adjacent right parietal lobe. Redemonstrated chronic cortically based infarct within right frontal operculum. Stable background chronic small vessel ischemic disease with chronic lacunar infarcts in the right corona radiata and bilateral basal ganglia. As before, there is chronic hemosiderin deposition associated with the posterior right cerebral hemisphere appear chronic infarct and at sites of chronic lacunar infarcts involving the bilateral caudate heads. Cerebral volume is normal for age. Vascular: Flow voids maintained within the proximal large arterial vessels. Skull and upper cervical spine: No focal marrow lesion. Incompletely assessed cervical spondylosis. Sinuses/Orbits: Visualized orbits demonstrate no acute abnormality. Mild ethmoid sinus mucosal thickening. No significant mastoid effusion. IMPRESSION: No evidence of acute intracranial abnormality. Stable chronic ischemic changes including multifocal chronic cortically based infarcts within the right cerebral hemisphere and multiple chronic small vessel lacunar infarcts. Mild ethmoid sinus mucosal thickening. Electronically Signed   By: Kellie Simmering DO   On: 07/30/2019 18:15   CUP PACEART REMOTE DEVICE CHECK  Result Date:  07/31/2019 Care[facility] summary report received.  Battery status OK. Normal device function. No new symptom episodes, tachy episodes, brady, or pause episodes. No new AF episodes. Monthly summary reports and ROV/PRN       Scheduled Meds: . atorvastatin  80 mg Oral Daily  . clopidogrel  75 mg Oral Daily  . insulin aspart  0-15 Units Subcutaneous TID WC  . insulin aspart  0-5 Units Subcutaneous QHS  . latanoprost  1 drop Both Eyes QHS  . nicotine  21 mg Transdermal Daily  . polyethylene glycol  17 g Oral BID  . senna  1 tablet Oral QHS  . sodium chloride flush  3 mL Intravenous Once  . traZODone  100 mg Oral QHS   Continuous Infusions: . dextrose 5 % and 0.9% NaCl 75 mL/hr at 07/31/19 2325  . sodium phosphate  Dextrose 5% IVPB 30 mmol (08/01/19 1051)     LOS: 5 days        Hosie Poisson, MD [facility]   To contact the attending provider between 7A-7P or the covering provider during after hours 7P-7A, please log into the web site [email or web address] and access using universal [place] password for that web site. If you do not have the password, please call the hospital operator.  08/01/2019, 2:16 PM     Notified by Radiologist that pt has bil PE and colitis .  Started the patient on IV heparin and IV zosyn.  Echocardiogram ordered.    Hosie Poisson, MD

## 2019-08-01 NOTE — Consult Note (Signed)
Telepsych Consultation   Reason for Consult: ''post stroke depression'' Referring Physician:  Kathlen ModyAkula Vijaya, MD Location of Patient: [address] Location of Provider: [facility]  Patient Identification: Denise SidlesSabryna Mcilvain MRN:  [id number] Principal Diagnosis: Major depressive disorder Diagnosis:  Principal Problem:   Major depressive disorder Active Problems:   Essential hypertension   Type 2 diabetes mellitus without complication (HCC)   CVA (cerebral vascular accident) (HCC)   Hyperlipidemia   Generalized weakness   Hypokalemia   Hyponatremia   Acute on chronic kidney failure (HCC)   Thrombocytopenia (HCC)   Elevated troponin   Lactic acidosis   AKI (acute kidney injury) (HCC)   Total Time spent with patient: 45 minutes  Subjective:   Denise SidlesSabryna Barthel is a 64 y.o. female patient admitted due to weakness.  HPI:  Patient with medical history significant of hypertension, diabetes mellitus, hyperlipidemia, CVA on 12/25/2018 and 03/27/2019 (has loop recorder), tobacco abuse and remote history of depression(dagnosed 20 years ago) who was admitted to the hospital due to generalized weakness. 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 However, she reports that her major problem is generalized body weakness and inability to eat for weeks due to bad teeth resulting in weight loss. She reports occasional depressive symptoms characterized by low energy level, anhedonia and lack of sleep. But she denies self harming thought, psychosis, delusional thinking or history of drug abuse.   Past Psychiatric History: depression  Risk to Self:  denies Risk to Others:  denies Prior Inpatient Therapy:  none reported Prior Outpatient Therapy:  PCP  Past Medical History:  Past Medical History:  Diagnosis Date  . CVA (cerebral vascular accident) (HCC) 12/2018   And 03/2019, loop recorder inserted 03/2019  . Diabetes mellitus without complication (HCC)   .  Hyperlipidemia   . Hypertension   . Obesity     Past Surgical History:  Procedure Laterality Date  . ABDOMINAL HYSTERECTOMY    . BUBBLE STUDY  03/20/2019   Procedure: BUBBLE STUDY;  Surgeon: Wendall StadeNishan, Peter C, MD;  [facility];  Service: Cardiovascular;;  . LOOP RECORDER INSERTION N/A 03/20/2019   Procedure: LOOP RECORDER INSERTION;  Surgeon: Regan Lemmingamnitz, Will Martin, MD;  Location: [facility] INVASIVE CV LAB;  Service: Cardiovascular;  Laterality: N/A;  . TEE WITHOUT CARDIOVERSION N/A 03/20/2019   Procedure: TRANSESOPHAGEAL ECHOCARDIOGRAM (TEE);  Surgeon: Wendall StadeNishan, Peter C, MD;  [facility];  Service: Cardiovascular;  Laterality: N/A;   Family History: No family history on file. Family Psychiatric  History:  Social History:  Social History   Substance and Sexual Activity  Alcohol Use Yes  . Alcohol/week: 0.0 standard drinks   Comment: red wine per MD suggestion to lower cholesterol     Social History   Substance and Sexual Activity  Drug Use Never    Social History   Socioeconomic History  . Marital status: Single    Spouse name: none  . Number of children: Not on file  . Years of education: Not on file  . Highest education level: Not on file  Occupational History    Comment: [employer] is her employer  Tobacco Use  . Smoking status: Current Every Day Smoker    Packs/day: 1.00    Types: Cigarettes  . Smokeless tobacco: Never Used  Substance and Sexual Activity  . Alcohol use: Yes    Alcohol/week: 0.0 standard drinks    Comment: red wine per MD suggestion to lower cholesterol  . Drug use: Never  . Sexual  activity: Not on file  Other Topics Concern  . Not on file  Social History Narrative   Leave alone   1-story home with 2-3 steps front   Right hand   Soda 3 can a day   [employer] is her employer   Social Determinants of Health   Financial Resource Strain: Low Risk   . Difficulty of Paying Living Expenses: Not hard at  all  Food Insecurity: No Food Insecurity  . Worried About Programme researcher, broadcasting/film/video in the Last Year: Never true  . Ran Out of Food in the Last Year: Never true  Transportation Needs: No Transportation Needs  . Lack of Transportation (Medical): No  . Lack of Transportation (Non-Medical): No  Physical Activity: Inactive  . Days of Exercise per Week: 0 days  . Minutes of Exercise per Session: 0 min  Stress: No Stress Concern Present  . Feeling of Stress : Not at all  Social Connections:   . Frequency of Communication with Friends and Family:   . Frequency of Social Gatherings with Friends and Family:   . Attends Religious Services:   . Active Member of Clubs or Organizations:   . Attends Banker Meetings:   Marland Kitchen Marital Status:    Additional Social History:    Allergies:   Allergies  Allergen Reactions  . Sulfur     Pt doesn't remember reaction    Labs:  Results for orders placed or performed during the hospital encounter of 07/26/19 (from the past 48 hour(s))  Glucose, capillary     Status: None   Collection Time: 07/30/19  4:18 PM  Result Value Ref Range   Glucose-Capillary 88 70 - 99 mg/dL    Comment: Glucose reference range applies only to samples taken after fasting for at least 8 hours.  Glucose, capillary     Status: None   Collection Time: 07/30/19  9:19 PM  Result Value Ref Range   Glucose-Capillary 80 70 - 99 mg/dL    Comment: Glucose reference range applies only to samples taken after fasting for at least 8 hours.   Comment 1 Notify RN    Comment 2 Document in Chart   Glucose, capillary     Status: None   Collection Time: 07/31/19 12:50 AM  Result Value Ref Range   Glucose-Capillary 88 70 - 99 mg/dL    Comment: Glucose reference range applies only to samples taken after fasting for at least 8 hours.   Comment 1 Notify RN    Comment 2 Document in Chart   Glucose, capillary     Status: None   Collection Time: 07/31/19  4:15 AM  Result Value Ref Range    Glucose-Capillary 88 70 - 99 mg/dL    Comment: Glucose reference range applies only to samples taken after fasting for at least 8 hours.   Comment 1 Notify RN    Comment 2 Document in Chart   Glucose, capillary     Status: Abnormal   Collection Time: 07/31/19  8:23 AM  Result Value Ref Range   Glucose-Capillary 112 (H) 70 - 99 mg/dL    Comment: Glucose reference range applies only to samples taken after fasting for at least 8 hours.   Comment 1 Notify RN    Comment 2 Document in Chart   Glucose, capillary     Status: None   Collection Time: 07/31/19 12:05 PM  Result Value Ref Range   Glucose-Capillary 82 70 - 99 mg/dL  Comment: Glucose reference range applies only to samples taken after fasting for at least 8 hours.  Glucose, capillary     Status: None   Collection Time: 07/31/19  4:22 PM  Result Value Ref Range   Glucose-Capillary 77 70 - 99 mg/dL    Comment: Glucose reference range applies only to samples taken after fasting for at least 8 hours.  Basic metabolic panel     Status: None   Collection Time: 07/31/19  6:47 PM  Result Value Ref Range   Sodium 140 135 - 145 mmol/L   Potassium 4.2 3.5 - 5.1 mmol/L   Chloride 108 98 - 111 mmol/L   CO2 22 22 - 32 mmol/L   Glucose, Bld 84 70 - 99 mg/dL    Comment: Glucose reference range applies only to samples taken after fasting for at least 8 hours.   BUN 15 8 - 23 mg/dL   Creatinine, Ser 0.35 0.44 - 1.00 mg/dL   Calcium 9.0 8.9 - 59.7 mg/dL   GFR calc non Af Amer >60 >60 mL/min   GFR calc Af Amer >60 >60 mL/min   Anion gap 10 5 - 15    Comment: Performed at [facility], [address], [place], [place] [address]  Magnesium     Status: None   Collection Time: 07/31/19  6:47 PM  Result Value Ref Range   Magnesium 1.8 1.7 - 2.4 mg/dL    Comment: Performed at [facility], [address], [place], [place] [address]  Phosphorus     Status: Abnormal   Collection Time: 07/31/19  6:47 PM  Result Value Ref Range    Phosphorus 1.9 (L) 2.5 - 4.6 mg/dL    Comment: Performed at [facility], [address] [place], [place] [address]  Glucose, capillary     Status: None   Collection Time: 07/31/19  8:57 PM  Result Value Ref Range   Glucose-Capillary 94 70 - 99 mg/dL    Comment: Glucose reference range applies only to samples taken after fasting for at least 8 hours.   Comment 1 Notify RN    Comment 2 Document in Chart   Glucose, capillary     Status: Abnormal   Collection Time: 08/01/19  3:57 AM  Result Value Ref Range   Glucose-Capillary 109 (H) 70 - 99 mg/dL    Comment: Glucose reference range applies only to samples taken after fasting for at least 8 hours.  Basic metabolic panel     Status: Abnormal   Collection Time: 08/01/19  5:45 AM  Result Value Ref Range   Sodium 139 135 - 145 mmol/L   Potassium 4.0 3.5 - 5.1 mmol/L   Chloride 108 98 - 111 mmol/L   CO2 20 (L) 22 - 32 mmol/L   Glucose, Bld 121 (H) 70 - 99 mg/dL    Comment: Glucose reference range applies only to samples taken after fasting for at least 8 hours.   BUN 12 8 - 23 mg/dL   Creatinine, Ser 1.22 (H) 0.44 - 1.00 mg/dL   Calcium 8.7 (L) 8.9 - 10.3 mg/dL   GFR calc non Af Amer 59 (L) >60 mL/min   GFR calc Af Amer >60 >60 mL/min   Anion gap 11 5 - 15    Comment: Performed at [facility], [address] [place], [place] [address]  CBC     Status: Abnormal   Collection Time: 08/01/19  5:45 AM  Result  Value Ref Range   WBC 4.8 4.0 - 10.5 K/uL   RBC 4.76 3.87 - 5.11 MIL/uL   Hemoglobin 14.5 12.0 - 15.0 g/dL   HCT 43.5 36.0 - 46.0 %   MCV 91.4 80.0 - 100.0 fL   MCH 30.5 26.0 - 34.0 pg   MCHC 33.3 30.0 - 36.0 g/dL   RDW 13.9 11.5 - 15.5 %   Platelets 101 (L) 150 - 400 K/uL    Comment: Immature Platelet Fraction may be clinically indicated, consider ordering this additional test [id number] CONSISTENT WITH PREVIOUS RESULT    nRBC 0.0 0.0 - 0.2 %    Comment: Performed at [facility], [address],  [place], [place] [address]  Glucose, capillary     Status: Abnormal   Collection Time: 08/01/19  7:47 AM  Result Value Ref Range   Glucose-Capillary 127 (H) 70 - 99 mg/dL    Comment: Glucose reference range applies only to samples taken after fasting for at least 8 hours.  Glucose, capillary     Status: None   Collection Time: 08/01/19 11:41 AM  Result Value Ref Range   Glucose-Capillary 90 70 - 99 mg/dL    Comment: Glucose reference range applies only to samples taken after fasting for at least 8 hours.    Medications:  Current Facility-Administered Medications  Medication Dose Route Frequency Provider Last Rate Last Admin  . acetaminophen (TYLENOL) tablet 650 mg  650 mg Oral Q6H PRN Pahwani, Rinka R, MD   650 mg at 07/28/19 2207   Or  . acetaminophen (TYLENOL) suppository 650 mg  650 mg Rectal Q6H PRN Pahwani, Rinka R, MD      . atorvastatin (LIPITOR) tablet 80 mg  80 mg Oral Daily Pahwani, Rinka R, MD   80 mg at 08/01/19 1100  . clopidogrel (PLAVIX) tablet 75 mg  75 mg Oral Daily Pahwani, Rinka R, MD   75 mg at 08/01/19 1100  . dextrose 5 %-0.9 % sodium chloride infusion   Intravenous Continuous Hosie Poisson, MD 75 mL/hr at 07/31/19 2325 New Bag at 07/31/19 2325  . hydrALAZINE (APRESOLINE) tablet 25 mg  25 mg Oral Q6H PRN Hosie Poisson, MD      . insulin aspart (novoLOG) injection 0-15 Units  0-15 Units Subcutaneous TID WC Pahwani, Rinka R, MD   2 Units at 08/01/19 [id number]  . insulin aspart (novoLOG) injection 0-5 Units  0-5 Units Subcutaneous QHS Pahwani, Rinka R, MD   3 Units at 07/27/19 2205  . iohexol (OMNIPAQUE) 9 MG/ML oral solution 500 mL  500 mL Oral Q1H Hosie Poisson, MD   500 mL at 08/01/19 1223  . latanoprost (XALATAN) 0.005 % ophthalmic solution 1 drop  1 drop Both Eyes QHS Pahwani, Rinka R, MD   1 drop at 07/31/19 2115  . magnesium hydroxide (MILK OF MAGNESIA) suspension 30 mL  30 mL Oral Daily PRN Hosie Poisson, MD      . nicotine (NICODERM CQ - dosed in mg/24 hours) patch 21 mg   21 mg Transdermal Daily Radene Gunning, NP   21 mg at 08/01/19 7341  . ondansetron (ZOFRAN) tablet 4 mg  4 mg Oral Q6H PRN Pahwani, Rinka R, MD       Or  . ondansetron (ZOFRAN) injection 4 mg  4 mg Intravenous Q6H PRN Pahwani, Rinka R, MD   4 mg at 07/31/19 1603  . polyethylene glycol (MIRALAX / GLYCOLAX) packet 17 g  17 g Oral BID Lang Snow, FNP  17 g at 07/30/19 2341  . senna (SENOKOT) tablet 8.6 mg  1 tablet Oral QHS Marikay Alar, FNP   8.6 mg at 07/30/19 2352  . sodium chloride flush (NS) 0.9 % injection 3 mL  3 mL Intravenous Once Vanetta Mulders, MD      . sodium phosphate 30 mmol in dextrose 5 % 250 mL infusion  30 mmol Intravenous Once Kathlen Mody, MD 43 mL/hr at 08/01/19 1051 30 mmol at 08/01/19 1051  . traZODone (DESYREL) tablet 100 mg  100 mg Oral QHS Thedore Mins, MD        Musculoskeletal: Strength & Muscle Tone: patient assessed via tele health Gait & Station: patient assessed via tele health Patient leans: N/A  Psychiatric Specialty Exam: Physical Exam  Psychiatric: Judgment and thought content normal. Her affect is blunt. Her speech is delayed. She is slowed. Cognition and memory are normal.    Review of Systems  Constitutional: Positive for appetite change and fatigue.  HENT: Negative.   Eyes: Negative.   Respiratory: Negative.   Cardiovascular: Negative.   Endocrine: Negative.   Allergic/Immunologic: Negative.   Psychiatric/Behavioral: Positive for dysphoric mood.    Blood pressure (!) 162/83, pulse 73, temperature 98 F (36.7 C), temperature source Oral, resp. rate 16, weight 81.6 kg, SpO2 100 %.Body mass index is 32.9 kg/m.  General Appearance: Casual  Eye Contact:  Good  Speech:  Clear and Coherent  Volume:  Decreased  Mood:  Dysphoric  Affect:  Constricted  Thought Process:  Coherent  Orientation:  Full (Time, Place, and Person)  Thought Content:  Logical  Suicidal Thoughts:  No  Homicidal Thoughts:  No  Memory:  Immediate;    Good Recent;   Good Remote;   Good  Judgement:  Intact  Insight:  Fair  Psychomotor Activity:  Psychomotor Retardation  Concentration:  Concentration: Good and Attention Span: Good  Recall:  Good  Fund of Knowledge:  Good  Language:  Good  Akathisia:  No  Handed:  Right  AIMS (if indicated):     Assets:  Communication Skills Desire for Improvement  ADL's: marginal  Cognition:  WNL  Sleep:   poor     Treatment Plan Summary:  64 year old female with history of depression who was admitted due to generalized body weakness. Today, patient reports occasional depressive symptoms but denies psychosis, delusions or self harming thoughts.Based on my evaluation, patient does not meet criteria for psychiatric admission but agrees to be prescribed low dose of anti-depressant.  Recommendations: -Patient may benefit from Trazodone 100 mg at bedtime for sleep/depression -Consider referring patient to an outpatient psychiatrist or therapist upon discharge.  Disposition: No evidence of imminent risk to self or others at present.   Patient does not meet criteria for psychiatric inpatient admission. Supportive therapy provided about ongoing stressors. Psychiatric service signing out. Re-consult as needed  This service was provided via telemedicine using a 2-way, interactive audio and video technology.  Names of all persons participating in this telemedicine service and their role in this encounter. Name: Denise Santiago Role: Patient  Name: Allegra Grana Role: RN  Name: Thedore Mins, MD Role: Psychiatrist    Thedore Mins, MD 08/01/2019 12:26 PM

## 2019-08-01 NOTE — Progress Notes (Signed)
ANTICOAGULATION CONSULT NOTE - Initial Consult  Pharmacy Consult for  IV heparin Indication: pulmonary embolus  Allergies  Allergen Reactions  . Sulfur     Pt doesn't remember reaction    Patient Measurements: Height: 5\' 2"  (157.5 cm) Weight: 179 lb 14.3 oz (81.6 kg) IBW/kg (Calculated) : 50.1 Heparin Dosing Weight: 68.3 kg  Vital Signs:    Labs: Recent Labs    07/30/19 0754 07/31/19 1847 08/01/19 0545  HGB  --   --  14.5  HCT  --   --  43.5  PLT  --   --  101*  CREATININE 1.18* 0.96 1.01*    Estimated Creatinine Clearance: 55.7 mL/min (A) (by C-G formula based on SCr of 1.01 mg/dL (H)).   Medical History: Past Medical History:  Diagnosis Date  . CVA (cerebral vascular accident) (HCC) 12/2018   And 03/2019, loop recorder inserted 03/2019  . Diabetes mellitus without complication (HCC)   . Hyperlipidemia   . Hypertension   . Obesity     Medications:  Infusions:  . dextrose 5 % and 0.9% NaCl 75 mL/hr at 07/31/19 2325  . heparin    . piperacillin-tazobactam (ZOSYN)  IV      Assessment: 64 yo female with PE seen on CT scan.  Pharmacy asked to begin IV heparin.  Baseline thrombocytopenia, Hgb WNL.  No anticoagulation noted PTA.  Goal of Therapy:  Heparin level 0.3-0.7 units/ml Monitor platelets by anticoagulation protocol: Yes   Plan:  Heparin bolus of 4100 units/hr Then start heparin gtt at 1100 units/hr Check heparin level in 6 hrs Daily heparin level and CBC. F/u plans for oral anticoagulation eventually.  77, [facility] Clinical Pharmacist Phone [phone]  08/01/2019 5:14 PM

## 2019-08-01 NOTE — Progress Notes (Signed)
Pharmacy Antibiotic Note  Denise Santiago is a 64 y.o. female admitted on 07/26/2019 with intraabdominal infection.  Pharmacy has been consulted for Zosyn dosing.  Plan: Zosyn 3.375g IV q8h (4 hour infusion).  Height: 5\' 2"  (157.5 cm) Weight: 179 lb 14.3 oz (81.6 kg) IBW/kg (Calculated) : 50.1  Temp (24hrs), Avg:97.7 F (36.5 C), Min:97.4 F (36.3 C), Max:98 F (36.7 C)  Recent Labs  Lab 07/26/19 0810 07/26/19 1104 07/26/19 1709 07/26/19 1709 07/26/19 1843 07/26/19 2156 07/27/19 0410 07/27/19 0410 07/28/19 0358 07/29/19 0351 07/30/19 0754 07/31/19 1847 08/01/19 0545  WBC   < >  --  5.4  --   --   --  4.7  --  5.7 4.8  --   --  4.8  CREATININE   < >  --  1.04*   < >  --   --  1.17*   < > 1.23* 1.11* 1.18* 0.96 1.01*  LATICACIDVEN  --  3.0*  --   --  1.8 1.8  --   --   --   --   --   --   --    < > = values in this interval not displayed.    Estimated Creatinine Clearance: 55.7 mL/min (A) (by C-G formula based on SCr of 1.01 mg/dL (H)).    Allergies  Allergen Reactions  . Sulfur     Pt doesn't remember reaction     Thank you for allowing pharmacy to be a part of this patient's care.  08/03/19, [facility] Clinical Pharmacist Phone [phone]  08/01/2019 5:15 PM

## 2019-08-02 ENCOUNTER — Inpatient Hospital Stay (HOSPITAL_COMMUNITY): Payer: Self-pay

## 2019-08-02 DIAGNOSIS — Z7189 Other specified counseling: Secondary | ICD-10-CM

## 2019-08-02 DIAGNOSIS — I2699 Other pulmonary embolism without acute cor pulmonale: Secondary | ICD-10-CM

## 2019-08-02 DIAGNOSIS — R627 Adult failure to thrive: Secondary | ICD-10-CM

## 2019-08-02 DIAGNOSIS — I6389 Other cerebral infarction: Secondary | ICD-10-CM

## 2019-08-02 DIAGNOSIS — K130 Diseases of lips: Secondary | ICD-10-CM

## 2019-08-02 DIAGNOSIS — R5381 Other malaise: Secondary | ICD-10-CM

## 2019-08-02 DIAGNOSIS — Z515 Encounter for palliative care: Secondary | ICD-10-CM

## 2019-08-02 DIAGNOSIS — K117 Disturbances of salivary secretion: Secondary | ICD-10-CM

## 2019-08-02 LAB — GLUCOSE, CAPILLARY
Glucose-Capillary: 102 mg/dL — ABNORMAL HIGH (ref 70–99)
Glucose-Capillary: 103 mg/dL — ABNORMAL HIGH (ref 70–99)
Glucose-Capillary: 105 mg/dL — ABNORMAL HIGH (ref 70–99)
Glucose-Capillary: 122 mg/dL — ABNORMAL HIGH (ref 70–99)
Glucose-Capillary: 124 mg/dL — ABNORMAL HIGH (ref 70–99)
Glucose-Capillary: 127 mg/dL — ABNORMAL HIGH (ref 70–99)

## 2019-08-02 LAB — CEA: CEA: 2.5 ng/mL (ref 0.0–4.7)

## 2019-08-02 LAB — HEPARIN LEVEL (UNFRACTIONATED)
Heparin Unfractionated: 0.89 IU/mL — ABNORMAL HIGH (ref 0.30–0.70)
Heparin Unfractionated: 1.26 IU/mL — ABNORMAL HIGH (ref 0.30–0.70)
Heparin Unfractionated: 1.02 IU/mL — ABNORMAL HIGH (ref 0.30–0.70)

## 2019-08-02 LAB — CBC
HCT: 41.1 % (ref 36.0–46.0)
Hemoglobin: 14.1 g/dL (ref 12.0–15.0)
MCH: 30.5 pg (ref 26.0–34.0)
MCHC: 34.3 g/dL (ref 30.0–36.0)
MCV: 88.8 fL (ref 80.0–100.0)
Platelets: 105 10*3/uL — ABNORMAL LOW (ref 150–400)
RBC: 4.63 MIL/uL (ref 3.87–5.11)
RDW: 13.8 % (ref 11.5–15.5)
WBC: 4.9 10*3/uL (ref 4.0–10.5)
nRBC: 0 % (ref 0.0–0.2)

## 2019-08-02 LAB — AFP TUMOR MARKER: AFP, Serum, Tumor Marker: 5.7 ng/mL (ref 0.0–8.3)

## 2019-08-02 LAB — ECHOCARDIOGRAM COMPLETE
Height: 62 in
Weight: 2878.3 oz

## 2019-08-02 LAB — CANCER ANTIGEN 19-9: CA 19-9: 28 U/mL (ref 0–35)

## 2019-08-02 MED ORDER — LISINOPRIL 20 MG PO TABS
20.0000 mg | ORAL_TABLET | Freq: Every day | ORAL | Status: DC
  Administered 2019-08-02 – 2019-09-04 (×34): 20 mg via ORAL
  Filled 2019-08-02 (×33): qty 1

## 2019-08-02 MED ORDER — HEPARIN (PORCINE) 25000 UT/250ML-% IV SOLN
600.0000 [IU]/h | INTRAVENOUS | Status: AC
  Administered 2019-08-02: 850 [IU]/h via INTRAVENOUS
  Filled 2019-08-02 (×2): qty 250

## 2019-08-02 MED ORDER — BIOTENE DRY MOUTH MT LIQD
15.0000 mL | Freq: Two times a day (BID) | OROMUCOSAL | Status: DC
  Administered 2019-08-02 – 2019-09-04 (×57): 15 mL via OROMUCOSAL
  Filled 2019-08-02 (×2): qty 15

## 2019-08-02 MED ORDER — VITAMINS A & D EX OINT
TOPICAL_OINTMENT | CUTANEOUS | Status: DC | PRN
  Filled 2019-08-02: qty 56.7

## 2019-08-02 MED ORDER — WHITE PETROLATUM EX OINT
TOPICAL_OINTMENT | CUTANEOUS | Status: AC
  Administered 2019-08-02: 0.2
  Filled 2019-08-02: qty 28.35

## 2019-08-02 NOTE — ACP (Advance Care Planning) (Signed)
  Palliative Medicine Inpatient Follow Up Note   ADVANCED CARE NOTE  I met with Denise Santiago and her niece, Denise Santiago to furtherdiscuss diagnosis prognosis, GOC, EOL wishes, disposition and options.  I introduced Palliative Medicine as specialized medical care for people living with serious illness. It focuses on providing relief from the symptoms and stress of a serious illness. The goal is to improve quality of life for both the patient and the family.  A detailed discussion was had today regarding concepts specific to code status, artifical feeding and hydration, continued IV antibiotics and rehospitalization was had. Denise Santiago and I reviewed and completed a MOST form with one another. She said that she would wish to be DNR/DNI with a time trial of antibiotics and IVFs. She stated that she would not like to have a feeding tube.   Discussed the importance of continued conversation with family and their medical providers regarding overall plan of care and treatment options, ensuring decisions are within the context of the patients values and GOCs.  Code Status: DNAR/DNI, limited interventions  Time Spent: 30  ______________________________________________________________________________________ [facility] Team Team Cell Phone: [phone] Please utilize secure chat with additional questions, if there is no response within 30 minutes please call the above phone number  Palliative Medicine Team providers are available by phone from 7am to 7pm daily and can be reached through the team cell phone.  Should this patient require assistance outside of these hours, please call the patient's attending physician.

## 2019-08-02 NOTE — Progress Notes (Signed)
ANTICOAGULATION CONSULT NOTE   Pharmacy Consult for  IV heparin Indication: pulmonary embolus  Allergies  Allergen Reactions  . Sulfur     Pt doesn't remember reaction    Patient Measurements: Height: 5\' 2"  (157.5 cm) Weight: 179 lb 14.3 oz (81.6 kg) IBW/kg (Calculated) : 50.1 Heparin Dosing Weight: 68.3 kg  Vital Signs: Temp: 97.9 F (36.6 C) (03/28 0018) Temp Source: Oral (03/28 0018) BP: 139/72 (03/28 0018) Pulse Rate: 80 (03/28 0018)  Labs: Recent Labs    07/30/19 0754 07/31/19 1847 08/01/19 0545 08/02/19 0108  HGB  --   --  14.5 14.1  HCT  --   --  43.5 41.1  PLT  --   --  101* 105*  HEPARINUNFRC  --   --   --  0.89*  CREATININE 1.18* 0.96 1.01*  --     Estimated Creatinine Clearance: 55.7 mL/min (A) (by C-G formula based on SCr of 1.01 mg/dL (H)).   Medical History: Past Medical History:  Diagnosis Date  . CVA (cerebral vascular accident) (HCC) 12/2018   And 03/2019, loop recorder inserted 03/2019  . Diabetes mellitus without complication (HCC)   . Hyperlipidemia   . Hypertension   . Obesity     Medications:  Infusions:  . dextrose 5 % and 0.9% NaCl 75 mL/hr at 08/01/19 1958  . heparin 1,100 Units/hr (08/01/19 1910)  . piperacillin-tazobactam (ZOSYN)  IV 3.375 g (08/02/19 0028)    Assessment: 64 yo female with PE seen on CT scan.  Pharmacy asked to begin IV heparin.  Baseline thrombocytopenia, Hgb WNL.  No anticoagulation noted PTA.  3/28 AM update:  Heparin level elevated No issues per RN  Goal of Therapy:  Heparin level 0.3-0.7 units/ml Monitor platelets by anticoagulation protocol: Yes   Plan:  Dec heparin to 1000 units/hr 1000 heparin level  4/28, PharmD, BCPS Clinical Pharmacist Phone: [phone]

## 2019-08-02 NOTE — Progress Notes (Signed)
ANTICOAGULATION CONSULT NOTE - Initial Consult  Pharmacy Consult for  IV heparin Indication: pulmonary embolus  Allergies  Allergen Reactions  . Sulfur     Pt doesn't remember reaction    Patient Measurements: Height: 5\' 2"  (157.5 cm) Weight: 179 lb 14.3 oz (81.6 kg) IBW/kg (Calculated) : 50.1 Heparin Dosing Weight: 68.3 kg  Vital Signs: Temp: 98.2 F (36.8 C) (03/28 0756) Temp Source: Oral (03/28 0355) BP: 159/84 (03/28 0756) Pulse Rate: 71 (03/28 0756)  Labs: Recent Labs    07/31/19 1847 08/01/19 0545 08/02/19 0108 08/02/19 0739  HGB  --  14.5 14.1  --   HCT  --  43.5 41.1  --   PLT  --  101* 105*  --   HEPARINUNFRC  --   --  0.89* 1.26*  CREATININE 0.96 1.01*  --   --     Estimated Creatinine Clearance: 55.7 mL/min (A) (by C-G formula based on SCr of 1.01 mg/dL (H)).   Medical History: Past Medical History:  Diagnosis Date  . CVA (cerebral vascular accident) (HCC) 12/2018   And 03/2019, loop recorder inserted 03/2019  . Diabetes mellitus without complication (HCC)   . Hyperlipidemia   . Hypertension   . Obesity     Medications:  Infusions:  . heparin 1,000 Units/hr (08/02/19 0215)  . piperacillin-tazobactam (ZOSYN)  IV 3.375 g (08/02/19 0519)    Assessment: 64 yo female with PE seen on CT scan.  Pharmacy asked to begin IV heparin.  Baseline thrombocytopenia, Hgb WNL.  No anticoagulation noted PTA.  Heparin level came back elevated again at 1.26. Rn stated that drip is running fine and level drawn appropriately.  Hgb wnl and plt remains low.   Goal of Therapy:  Heparin level 0.3-0.7 units/ml Monitor platelets by anticoagulation protocol: Yes   Plan:  Hold heparin x 30 minutes Decrease heparin infusion to 850 units/hr  Check heparin level in 6 hrs Daily heparin level and CBC. F/u plans for oral anticoagulation eventually.  77, PharmD, BCIDP, AAHIVP, CPP Infectious Disease Pharmacist 08/02/2019 11:11 AM

## 2019-08-02 NOTE — Consult Note (Signed)
Consultation Note Date: 08/02/2019   Patient Name: Denise Santiago  DOB: Sep 05, 1955  MRN: [id number]  Age / Sex: 64 y.o., female  PCP: Seward Carol, MD Referring Physician: Hosie Poisson, MD  Reason for Consultation: Establishing goals of care  HPI/Patient Profile:  64 year old woman with prior history of hypertension, diabetes, hyperlipidemia, CVA presents to ED for generalized weakness and fall.  On arrival to ED her labs were abnormal for elevated troponin, hypokalemia and hypomagnesemia.  Electrolytes were all repleted and therapy evaluations are recommending SNF at this time.  She continued to have generalized weakness , some dysphagia n the setting of multiple strokes. It was followed with an MRI brain,  showing no evidence of acute intracranial abnormality. Stable chronic ischemic changes including multifocal chronic cortically based infarcts within the right cerebral hemisphere and multiple chronic small vessel lacunar infarcts.   As per the sister patient has lost more than 50 pounds since November after her last stroke. On further questioning, pt reports that she has altered taste and no appetite whatso ever, along with loose teeth , preventing her to eat any solid food. Malignancy work up has been initiated and CT abd , pelvis and chest with contrast ordered for further evaluation.  CT of the chest with contrast showed acute bilateral pulmonary embolism and CT of the abdomen showed ascending colon colitis.  She was started on IV heparin for acute PE and IV Zosyn for colitis.  Meanwhile I ordered esophagogram was ordered for evaluation of dysphagia.  SLP evaluation done recommended regular diet with thin liquids.  Palliative care was asked to consult in the setting of poor PO intake and weight loss.   Clinical Assessment and Goals of Care: I have reviewed medical records including EPIC notes, labs and  imaging, received report from bedside RN, assessed the patient. Patient is lying on bed upon arrival she has contractures in her upper extremities and a facial droop. These are not new per review.     I met with Lucas Mallow and her niece, Patience to further discuss diagnosis prognosis, GOC, EOL wishes, disposition and options.   I introduced Palliative Medicine as specialized medical care for people living with serious illness. It focuses on providing relief from the symptoms and stress of a serious illness. The goal is to improve quality of life for both the patient and the family.  Together, [place] and I reviewed her life. She was born and raised in [place]. She never married and has no children. 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 She is of the [employer]. Things that bring [place] circulate [place] Her family has a cabin that she use to frequent.   She vocalized that she was "found on the floor" of her home leading to this most recent hospital admission. I asked her if she has a life alert band which she stated she does not. She felt this would be a good  idea moving forward.   Prior to this hospital stay Emelly stated that she was a well functioning woman still living independently despite prior strokes. She says that she used a cane and a walker at home. She also had a wheelchair available if she needed it. Her sister and niece are sources of support for her.   A detailed discussion was had today regarding concepts specific to code status, artifical feeding and hydration, continued IV antibiotics and rehospitalization was had.  Myosha and I reviewed and completed a MOST form with one another. She said that she would wish to be DNR/DNI with a time trial of antibiotics and IVFs. She stated that she would not like to have a feeding tube.   We talked at length  about Gerald's weight loss. She shared that today her mouth is hurting, it does appear to be cracked. Nursing has offered to get Vaseline. I was unable to do a good oral exam due to yogurt being in her oral cavity but we will pursue this the next time we see her to rule out candidiasis.   The difference between a aggressive medical intervention path  and a palliative comfort care path for this patient at this time was had. Values and goals of care important to patient and family were attempted to be elicited. Kaydon is a very independent woman and would ideally hope to get somewhere close to that level of functioning. We discussed her hospitalization and the complications she has encountered during it. She said that her personal goal would be to go to rehabilitation to get stronger. I shared with her neice, Patience that she may require a new level of care after that. They both seemed to understand. They are both open to OP Palliative care consultation.   Discussed with patient the importance of continued conversation with family and their  medical providers regarding overall plan of care and treatment options, ensuring decisions are within the context of the patients values and GOCs.  Decision Maker: Patient is able to make decisions for herself but if she were unable to decisions would be deferred to her sister, Tyrone Apple   SUMMARY OF RECOMMENDATIONS   DNAR/DNI  Treat what is treatable  MOST completed --> On chart  DNR completed --> ON chart  TOC --> OP Palliative  TOC --> Help patient get life alert band  Plan for SNF placement  Nutrition for calorie count  Code Status/Advance Care Planning:  DNR  Symptom Management:  Failure to Thrive:  - 1:1 Feeding  - Supplemental's w/ Glucerna (patient likes to drink from straws, per SLP note this is okay)   - Nutrition evaluation, asked for calorie count  - She will need to see an outpatient dental specialist for new  dentures  Physical Debility:  - PT Eval  - OT Eval  Dry Lips:  - Please apply lip balm BID  Xerostomia:  - Biotene BID  - Mouth care BID  Palliative Prophylaxis:   Aspiration, Bowel Regimen, Delirium Protocol, Eye Care, Frequent Pain Assessment, Oral Care, Palliative Wound Care and Turn Reposition  Additional Recommendations (Limitations, Scope, Preferences):  Treat what is treatable  Psycho-social/Spiritual:   Desire for further Chaplaincy support:YES  Additional Recommendations: Caregiving  Support/Resources  Prognosis:   Unable to determine  Discharge Planning: [place] for rehab with Palliative care service follow-up     Primary Diagnoses: Present on Admission: . Essential hypertension . Hyperlipidemia . CVA (cerebral vascular accident) ([place]) . Hypokalemia . Hyponatremia .  Acute on chronic kidney failure ([place]) . Thrombocytopenia ([place]) . Elevated troponin . Lactic acidosis . AKI (acute kidney injury) ([place])  I have reviewed the medical record, interviewed the patient and family, and examined the patient. The following aspects are pertinent.  Past Medical History:  Diagnosis Date  . CVA (cerebral vascular accident) ([place]) 12/2018   And 03/2019, loop recorder inserted 03/2019  . Diabetes mellitus without complication ([place])   . Hyperlipidemia   . Hypertension   . Obesity    Social History   Socioeconomic History  . Marital status: Single    Spouse name: none  . Number of children: Not on file  . Years of education: Not on file  . Highest education level: Not on file  Occupational History    Comment: [employer] is her employer  Tobacco Use  . Smoking status: Current Every Day Smoker    Packs/day: 1.00    Types: Cigarettes  . Smokeless tobacco: Never Used  Substance and Sexual Activity  . Alcohol use: Yes    Alcohol/week: 0.0 standard drinks    Comment: red wine per MD suggestion to lower cholesterol  . Drug use:  Never  . Sexual activity: Not on file  Other Topics Concern  . Not on file  Social History Narrative   Leave alone   1-story home with 2-3 steps front   Right hand   Soda 3 can a day   [employer] is her employer   Social Determinants of Health   Financial Resource Strain: [place]   . Difficulty of Paying Living Expenses: Not hard at all  Food Insecurity: No Food Insecurity  . Worried About Charity fundraiser in the Last Year: Never true  . Ran Out of Food in the Last Year: Never true  Transportation Needs: No Transportation Needs  . Lack of Transportation (Medical): No  . Lack of Transportation (Non-Medical): No  Physical Activity: Inactive  . Days of Exercise per Week: 0 days  . Minutes of Exercise per Session: 0 min  Stress: No Stress Concern Present  . Feeling of Stress : Not at all  Social Connections:   . Frequency of Communication with Friends and Family:   . Frequency of Social Gatherings with Friends and Family:   . Attends Religious Services:   . Active Member of Clubs or Organizations:   . Attends Archivist Meetings:   Marland Kitchen Marital Status:    No family history on file. Scheduled Meds: . atorvastatin  80 mg Oral Daily  . clopidogrel  75 mg Oral Daily  . insulin aspart  0-15 Units Subcutaneous TID WC  . insulin aspart  0-5 Units Subcutaneous QHS  . latanoprost  1 drop Both Eyes QHS  . lisinopril  20 mg Oral Daily  . nicotine  21 mg Transdermal Daily  . polyethylene glycol  17 g Oral BID  . senna  1 tablet Oral QHS  . sodium chloride flush  3 mL Intravenous Once  . traZODone  100 mg Oral QHS   Continuous Infusions: . heparin 850 Units/hr (08/02/19 1157)  . piperacillin-tazobactam (ZOSYN)  IV 3.375 g (08/02/19 1407)   PRN Meds:.acetaminophen **OR** acetaminophen, hydrALAZINE, ondansetron **OR** ondansetron (ZOFRAN) IV Medications Prior to Admission:  Prior to Admission medications   Medication Sig Start Date End Date Taking?  Authorizing Provider  acetaminophen (TYLENOL) 325 MG tablet Take 2 tablets (650 mg total) by mouth every 6 (six) hours as needed for mild pain (  or Fever >/= 101). Patient taking differently: Take 325 mg by mouth every 6 (six) hours as needed for mild pain or moderate pain (or Fever >/= 101).  03/21/19  Yes Emokpae, Courage, MD  atorvastatin (LIPITOR) 80 MG tablet Take 1 tablet (80 mg total) by mouth daily. 03/21/19 03/20/20 Yes Emokpae, Courage, MD  citalopram (CELEXA) 10 MG tablet Take 1 tablet (10 mg total) by mouth daily. 12/12/18  Yes Epifanio Lesches, MD  clopidogrel (PLAVIX) 75 MG tablet Take 75 mg by mouth daily.    Yes [provider]  hydrochlorothiazide (HYDRODIURIL) 25 MG tablet Take 25 mg by mouth daily.   Yes [provider]  latanoprost (XALATAN) 0.005 % ophthalmic solution Place 1 drop into both eyes at bedtime. 12/11/18  Yes Epifanio Lesches, MD  lisinopril (ZESTRIL) 40 MG tablet Take 40 mg by mouth daily.   Yes [provider]  metFORMIN (GLUCOPHAGE) 850 MG tablet Take 1 tablet (850 mg total) by mouth 2 (two) times daily with a meal. 03/21/19  Yes Roxan Hockey, MD  Investigational - Study Medication Study name: -See attached Additional study details: -See attached BMS Axiomatic  stroke prevention trial Patient not taking: Reported on 07/26/2019 03/21/19   Roxan Hockey, MD  Investigational - Study Medication Study name: BMS Axiomatic  stroke prevention trial Additional study details:  BMS Axiomatic  stroke prevention trial Patient not taking: Reported on 07/26/2019 03/21/19   Roxan Hockey, MD  Investigational - Study Medication Study name: BMS Axiomatic  stroke prevention trial Additional study details: Patient not taking: Reported on 07/26/2019 03/21/19   Roxan Hockey, MD  lisinopril-hydrochlorothiazide (ZESTORETIC) 20-12.5 MG tablet Take 1 tablet by mouth daily. Start Monday 03/23/2019 Patient not taking: Reported on 07/26/2019  03/21/19   Roxan Hockey, MD   Allergies  Allergen Reactions  . Sulfur     Pt doesn't remember reaction   Review of Systems  Constitutional: Positive for activity change.  Neurological: Positive for tremors.   Physical Exam Vitals and nursing note reviewed.  HENT:     Head: Normocephalic.     Nose: Nose normal.     Mouth/Throat:     Mouth: Mucous membranes are dry.  Eyes:     Pupils: Pupils are equal, round, and reactive to light.  Cardiovascular:     Rate and Rhythm: Normal rate and regular rhythm.  Pulmonary:     Effort: Pulmonary effort is normal.  Abdominal:     Palpations: Abdomen is soft.  Musculoskeletal:     Cervical back: Normal range of motion.  Skin:    General: Skin is dry.     Capillary Refill: Capillary refill takes less than 2 seconds.  Neurological:     Mental Status: She is alert and oriented to person, place, and time.  Psychiatric:        Mood and Affect: Mood normal.    Vital Signs: BP (!) 144/87 (BP Location: Right Arm)   Pulse 87   Temp 98.1 F (36.7 C) (Oral)   Resp 18   Ht 5' 2"  (1.575 m)   Wt 81.6 kg   SpO2 100%   BMI 32.90 kg/m  Pain Scale: 0-10 POSS *See Group Information*: S-Acceptable,Sleep, easy to arouse Pain Score: 0-No pain  SpO2: SpO2: 100 % O2 Device:SpO2: 100 % O2 Flow Rate: .   IO: Intake/output summary:   Intake/Output Summary (Last 24 hours) at 08/02/2019 1754 Last data filed at 08/01/2019 1910 Gross per 24 hour  Intake 192.48 ml  Output --  Net 192.48 ml   LBM: Last BM Date: 07/31/19 Baseline Weight: Weight: 83.1 kg Most recent weight: Weight: 81.6 kg     Palliative Assessment/Data: 30%  Time In: 0520 Time Out: 0630 Time Total: 70 Greater than 50%  of this time was spent counseling and coordinating care related to the above assessment and plan.  Signed by: Rosezella Rumpf, NP   Please contact Palliative Medicine Team phone at [phone] for questions and concerns.  For individual provider: See  Shea Evans

## 2019-08-02 NOTE — Progress Notes (Signed)
Bilateral lower extremity venous duplex completed. Refer to "CV Proc" under chart review to view preliminary results.  08/02/2019 4:01 PM Eula Fried., MHA, RVT, RDCS, RDMS

## 2019-08-02 NOTE — Progress Notes (Signed)
Patient did not chew her food.  RN encouraged and fully assisting patient with breakfast.  She is awake and said that she wanted to eat.  She does not initiate any chewing or swallowing when food or liquid was given to her despite constant verbal cues.

## 2019-08-02 NOTE — Progress Notes (Signed)
ANTICOAGULATION CONSULT NOTE  Pharmacy Consult for  IV heparin Indication: pulmonary embolus  Allergies  Allergen Reactions  . Sulfur     Pt doesn't remember reaction    Patient Measurements: Height: 5\' 2"  (157.5 cm) Weight: 179 lb 14.3 oz (81.6 kg) IBW/kg (Calculated) : 50.1 Heparin Dosing Weight: 68.3 kg  Vital Signs: Temp: 98.1 F (36.7 C) (03/28 1635) Temp Source: Oral (03/28 1635) BP: 144/87 (03/28 1635) Pulse Rate: 87 (03/28 1635)  Labs: Recent Labs    07/31/19 1847 08/01/19 0545 08/02/19 0108 08/02/19 0739 08/02/19 1812  HGB  --  14.5 14.1  --   --   HCT  --  43.5 41.1  --   --   PLT  --  101* 105*  --   --   HEPARINUNFRC  --   --  0.89* 1.26* 1.02*  CREATININE 0.96 1.01*  --   --   --     Estimated Creatinine Clearance: 55.7 mL/min (A) (by C-G formula based on SCr of 1.01 mg/dL (H)).   Medical History: Past Medical History:  Diagnosis Date  . CVA (cerebral vascular accident) ([facility]) 12/2018   And 03/2019, loop recorder inserted 03/2019  . Diabetes mellitus without complication ([facility])   . Hyperlipidemia   . Hypertension   . Obesity     Medications:  Infusions:  . heparin 700 Units/hr (08/02/19 1919)  . piperacillin-tazobactam (ZOSYN)  IV 3.375 g (08/02/19 1407)    Assessment: 64 yo female with PE seen on CT scan.  Pharmacy asked to begin IV heparin.  Baseline thrombocytopenia, Hgb WNL.  No anticoagulation noted PTA.  Heparin level came back elevated again at 1.02.  Level was drawn from right hand, heparin infusing in right arm.   Goal of Therapy:  Heparin level 0.3-0.7 units/ml Monitor platelets by anticoagulation protocol: Yes   Plan:  Decrease heparin to 700 units/hr. Recheck heparin level in 8 hrs. Will ask phlebotomy to draw from opposite arm. Daily heparin level and CBC.  77, [facility] Clinical Pharmacist Phone [phone]  08/02/2019 7:22 PM

## 2019-08-02 NOTE — Progress Notes (Addendum)
PROGRESS NOTE    Denise Santiago  FYB:[id number] DOB: December 29, 1955 DOA: 07/26/2019 PCP: Seward Carol, MD    Brief Narrative:   64 year old woman with prior history of hypertension, diabetes, hyperlipidemia, CVA presents to ED for generalized weakness and fall.  On arrival to ED her labs were abnormal for elevated troponin, hypokalemia and hypomagnesemia.  Electrolytes were all repleted and therapy evaluations are recommending SNF at this time.  She continued to have generalized weakness , some dysphagia n the setting of multiple strokes. It was followed with an MRI brain,  showing No evidence of acute intracranial abnormality. Stable chronic ischemic changes including multifocal chronic cortically based infarcts within the right cerebral hemisphere and multiple chronic small vessel lacunar infarcts.  As per the sister patient has lost more than 50 pounds since November after her last stroke. On further questioning, pt reports that she has altered taste and no appetite whatso ever, along with loose teeth , preventing her to eat any solid food. Malignancy work up has been initiated and CT abd , pelvis and chest with contrast ordered for further evaluation.  CT of the chest with contrast showed acute bilateral pulmonary embolism and CT of the abdomen showed ascending colon colitis.  She was started on IV heparin for acute PE and IV Zosyn for colitis.  Meanwhile I ordered esophagogram was ordered for evaluation of dysphagia.  SLP evaluation done recommended regular diet with thin liquids.  Patient seen and examined at bedside    Assessment & Plan:   Principal Problem:   Major depressive disorder Active Problems:   Essential hypertension   Type 2 diabetes mellitus without complication (HCC)   CVA (cerebral vascular accident) ([place])   Hyperlipidemia   Generalized weakness   Hypokalemia   Hyponatremia   Acute on chronic kidney failure (HCC)   Thrombocytopenia (HCC)   Elevated troponin  Lactic acidosis   AKI (acute kidney injury) ([place])   Generalized weakness , adult failure to thrive Probably secondary to electrolyte abnormalities and general deconditioning due to multiple strokes in the past.  Initial CT of the head shows old right posterior parietal infarct without any acute intracranial abnormalities. MRI brain shows No evidence of acute intracranial abnormality. Stable chronic ischemic changes including multifocal chronic cortically based infarcts within the right cerebral hemisphere and multiple chronic small vessel lacunar infarcts. PT evaluation recommending CIR but as patient does not have enough support/caregiver assistance at home and family is inclined towards SNF as per inpatient rehab admission coordinator.  TOC consulted for SNF placement at this time.  Due to lack of insurance,  placement to SNF has been difficult    Adult failure to thrive Probably secondary to deconditioning and poor appetite. Differential include depression post stroke vs general deconditioning  Psychiatry consulted for further evaluation of depression.  She will be started on trazodone at night. Further evaluation for malignancy ordered,  Tumour markers are negative.   CT chest with contrast  Shows Acute pulmonary emboli involve the right and left main pulmonary arteries as well as the lobar and segmental branches supplying all the lobes of both lungs. No evidence of right heart strain.  CT abdomen showed ascending colon colitis.    Ascending colon colitis:  Started the patient on IV zosyn. RN reports one loose BM yesterday.    Bilateral pulmonary embolism Patient was started on IV heparin plan to transition to oral Eliquis in the next 24 hours. Venous duplex of the lower extremities ordered Echocardiogram ordered for evaluation of right  heart strain.  AKI superimposed on stage II CKD Creatinine improving with hydration.  Baseline creatinine appears to be around 1.1.  Creatinine  back to baseline.   Essential hypertension Suboptimally controlled blood pressure parameters.  She was started on her home medication lisinopril 20 mg daily.,  Hydralazine ordered.    Type 2 diabetes mellitus controlled.  Hemoglobin A1c is 6.9. CBG (last 3)  Recent Labs    08/01/19 2039 08/02/19 0017 08/02/19 0804  GLUCAP 101* 102* 124*  CBGs are well controlled, Continue with sliding scale insulin.     History of recent CVA Continue with Plavix and statin    Hyperlipidemia Continue with statin.    Lactic acidosis In the setting of dehydration. Resolved   UTI:  Completed the course of IV Rocephin.   Chronic thrombocytopenia Platelets have been low in the past . No obvious signs of bleeding.    Constipation  Resolved   Poor oral hygiene, loose teeth in the lower part of the mouth Missing dentures Dental consult will be requested tomorrow for further evaluation.   DVT prophylaxis: Heparin Code Status: DNR Family Communication: Called sister and updated on 07/31/2019. Disposition Plan:   Patient came from: Home             Anticipated d/c place: SNF  Barriers to d/c OR conditions which need to be met to effect a safe d/c: TOC consult in place for SNF placement, difficult placement due to lack of insurance.   Consultants:   CIR   Procedures: Echocardiogram.    Antimicrobials: ROCEPHIN.    Subjective: Patient reports she does not feel like eating any solid food at this time.  She reports being nauseated and some abdominal pain.  Objective: Vitals:   08/01/19 1943 08/02/19 0018 08/02/19 0355 08/02/19 0756  BP: (!) 144/78 139/72 (!) 150/100 (!) 159/84  Pulse: 76 80 80 71  Resp: 18 19 17 18   Temp: 97.9 F (36.6 C) 97.9 F (36.6 C) 97.6 F (36.4 C) 98.2 F (36.8 C)  TempSrc: Oral Oral Oral   SpO2: 99% 100% 96% 100%  Weight:      Height:        Intake/Output Summary (Last 24 hours) at 08/02/2019 1016 Last data filed at 08/01/2019  1910 Gross per 24 hour  Intake 1094.58 ml  Output --  Net 1094.58 ml   Filed Weights   07/31/19 0416 08/01/19 0500 08/01/19 1700  Weight: 81.1 kg 81.6 kg 81.6 kg    Examination:  General exam: Uncomfortable but not in any kind of distress Respiratory system: Clear to auscultation bilaterally, no wheezing or rhonchi. Cardiovascular system: S1 S2 heard, regular rate rhythm, no JVD, no pedal edema Gastrointestinal system: Abdomen is soft, mildly tender in the lower abdomen, bowel sounds normal, nondistended. Central nervous system:.  Alert and answering questions and following commands.   Extremities: No cyanosis or clubbing Skin: No rashes seen psychiatry: Flat affect   Data Reviewed: I have personally reviewed following labs and imaging studies  CBC: Recent Labs  Lab 07/27/19 0410 07/28/19 0358 07/29/19 0351 08/01/19 0545 08/02/19 0108  WBC 4.7 5.7 4.8 4.8 4.9  NEUTROABS  --   --  3.4  --   --   HGB 14.4 13.9 14.1 14.5 14.1  HCT 42.0 40.2 41.5 43.5 41.1  MCV 89.0 88.7 89.1 91.4 88.8  PLT 97* 97* 110* 101* 664*   Basic Metabolic Panel: Recent Labs  Lab 07/26/19 1709 07/27/19 0410 07/28/19 0358 07/29/19 0351 07/30/19 0754  07/31/19 1847 08/01/19 0545  NA  --    < > 139 137 139 140 139  K  --    < > 4.1 4.6 4.9 4.2 4.0  CL  --    < > 107 107 109 108 108  CO2  --    < > 20* 20* 20* 22 20*  GLUCOSE  --    < > 102* 104* 104* 84 121*  BUN  --    < > 17 14 17 15 12   CREATININE 1.04*   < > 1.23* 1.11* 1.18* 0.96 1.01*  CALCIUM  --    < > 8.9 8.9 8.9 9.0 8.7*  MG 2.7*  --   --   --  2.0 1.8  --   PHOS  --   --   --   --   --  1.9*  --    < > = values in this interval not displayed.   GFR: Estimated Creatinine Clearance: 55.7 mL/min (A) (by C-G formula based on SCr of 1.01 mg/dL (H)). Liver Function Tests: No results for input(s): AST, ALT, ALKPHOS, BILITOT, PROT, ALBUMIN in the last 168 hours. No results for input(s): LIPASE, AMYLASE in the last 168 hours. No  results for input(s): AMMONIA in the last 168 hours. Coagulation Profile: No results for input(s): INR, PROTIME in the last 168 hours. Cardiac Enzymes: Recent Labs  Lab 07/26/19 1709 07/27/19 0410  CKTOTAL 429* 223  CKMB 22.3*  --    BNP (last 3 results) No results for input(s): PROBNP in the last 8760 hours. HbA1C: No results for input(s): HGBA1C in the last 72 hours. CBG: Recent Labs  Lab 08/01/19 0747 08/01/19 1141 08/01/19 2039 08/02/19 0017 08/02/19 0804  GLUCAP 127* 90 101* 102* 124*   Lipid Profile: No results for input(s): CHOL, HDL, LDLCALC, TRIG, CHOLHDL, LDLDIRECT in the last 72 hours. Thyroid Function Tests: No results for input(s): TSH, T4TOTAL, FREET4, T3FREE, THYROIDAB in the last 72 hours. Anemia Panel: No results for input(s): VITAMINB12, FOLATE, FERRITIN, TIBC, IRON, RETICCTPCT in the last 72 hours. Sepsis Labs: Recent Labs  Lab 07/26/19 1104 07/26/19 1843 07/26/19 2156  LATICACIDVEN 3.0* 1.8 1.8    Recent Results (from the past 240 hour(s))  Culture, blood (Routine X 2) w Reflex to ID Panel     Status: None   Collection Time: 07/26/19 11:04 AM   Specimen: BLOOD LEFT HAND  Result Value Ref Range Status   Specimen Description BLOOD LEFT HAND  Final   Special Requests   Final    BOTTLES DRAWN AEROBIC AND ANAEROBIC Blood Culture adequate volume   Culture   Final    NO GROWTH 5 DAYS Performed at [facility], [address], [place], [place] [address]    Report Status 07/31/2019 FINAL  Final  SARS CORONAVIRUS 2 (TAT 6-24 HRS) Nasopharyngeal Nasopharyngeal Swab     Status: None   Collection Time: 07/26/19 12:44 PM   Specimen: Nasopharyngeal Swab  Result Value Ref Range Status   SARS Coronavirus 2 NEGATIVE NEGATIVE Final    Comment: (NOTE) SARS-CoV-2 target nucleic acids are NOT DETECTED. The SARS-CoV-2 RNA is generally detectable in upper and lower respiratory specimens during the acute phase of infection. Negative results do not  preclude SARS-CoV-2 infection, do not rule out co-infections with other pathogens, and should not be used as the sole basis for treatment or other patient management decisions. Negative results must be combined with clinical observations, patient history, and epidemiological information.  The expected result is Negative. Fact Sheet for Patients: SugarRoll.be Fact Sheet for Healthcare Providers: https://www.woods-mathews.com/ This test is not yet approved or cleared by the [place] FDA and  has been authorized for detection and/or diagnosis of SARS-CoV-2 by FDA under an Emergency Use Authorization (EUA). This EUA will remain  in effect (meaning this test can be used) for the duration of the COVID-19 declaration under Section 56 4(b)(1) of the Act, 21 U.S.C. section 360bbb-3(b)(1), unless the authorization is terminated or revoked sooner. Performed at [facility], [place] [address] [place], [place] [address]   Culture, blood (Routine X 2) w Reflex to ID Panel     Status: None   Collection Time: 07/26/19  5:09 PM   Specimen: BLOOD LEFT HAND  Result Value Ref Range Status   Specimen Description BLOOD LEFT HAND  Final   Special Requests   Final    BOTTLES DRAWN AEROBIC ONLY Blood Culture results may not be optimal due to an inadequate volume of blood received in culture bottles   Culture   Final    NO GROWTH 5 DAYS Performed at [facility], [place] [address] [place],  [address]    Report Status 07/31/2019 FINAL  Final  Urine Culture     Status: Abnormal   Collection Time: 07/27/19  6:20 AM   Specimen: Urine, Random  Result Value Ref Range Status   Specimen Description URINE, RANDOM  Final   Special Requests   Final    NONE Performed at [facility], [place] [address], [place], [place] [address]    Culture 80,000 COLONIES/mL ESCHERICHIA COLI (A)  Final   Report Status 07/29/2019 FINAL  Final   Organism ID, Bacteria  ESCHERICHIA COLI (A)  Final      Susceptibility   Escherichia coli - MIC*    AMPICILLIN <=2 SENSITIVE Sensitive     CEFAZOLIN <=4 SENSITIVE Sensitive     CEFTRIAXONE <=0.25 SENSITIVE Sensitive     CIPROFLOXACIN <=0.25 SENSITIVE Sensitive     GENTAMICIN <=1 SENSITIVE Sensitive     IMIPENEM <=0.25 SENSITIVE Sensitive     NITROFURANTOIN <=16 SENSITIVE Sensitive     TRIMETH/SULFA <=20 SENSITIVE Sensitive     AMPICILLIN/SULBACTAM <=2 SENSITIVE Sensitive     * 80,000 COLONIES/mL ESCHERICHIA COLI         Radiology Studies: CT CHEST W CONTRAST  Result Date: 08/01/2019 CLINICAL DATA:  Unintended weight loss EXAM: CT CHEST, ABDOMEN AND PELVIS WITHOUT CONTRAST TECHNIQUE: Multidetector CT imaging of the chest, abdomen and pelvis was performed following the standard protocol without IV contrast. COMPARISON:  Chest radiograph dated 07/26/2019. FINDINGS: CT CHEST FINDINGS Cardiovascular: Acute pulmonary emboli involve the right and left main pulmonary arteries as well as the lobar and segmental branches supplying all the lobes of both lungs. There is no evidence of right heart strain. Vascular calcifications are seen in the aortic arch. Normal heart size. No pericardial effusion. Mediastinum/Nodes: No enlarged mediastinal, hilar, or axillary lymph nodes. Thyroid gland, trachea, and esophagus demonstrate no significant findings. Lungs/Pleura: There is a trace left pleural effusion with associated atelectasis. There is mild dependent atelectasis. There is no pneumothorax or focal consolidation. Musculoskeletal: No chest wall mass or suspicious bone lesions identified. CT ABDOMEN PELVIS FINDINGS Hepatobiliary: The liver is hypoattenuating relative to the spleen, suggestive of hepatic steatosis. No focal liver abnormality is seen. No gallstones, gallbladder wall thickening, or biliary dilatation. Pancreas: Unremarkable. No pancreatic ductal dilatation or surrounding inflammatory changes. Spleen: Normal  in size  without focal abnormality. Adrenals/Urinary Tract: Adrenal glands are unremarkable. Kidneys are normal, without renal calculi, focal lesion, or hydronephrosis. Bladder is unremarkable. Stomach/Bowel: Stomach is within normal limits. Appendix appears normal. There is wall thickening with surrounding fat stranding and edema involving the ascending colon. There is no evidence of bowel obstruction. Vascular/Lymphatic: Aortic atherosclerosis. No enlarged abdominal or pelvic lymph nodes. Reproductive: Status post hysterectomy. No adnexal masses. Other: No abdominal wall hernia or abnormality. There is a small amount of fluid in the pelvis. Musculoskeletal: No acute or significant osseous findings. Degenerative changes are seen in the spine. IMPRESSION: 1. Acute pulmonary emboli involve the right and left main pulmonary arteries as well as the lobar and segmental branches supplying all the lobes of both lungs. No evidence of right heart strain. 2. Wall thickening with surrounding fat stranding and edema involving the ascending colon, consistent with an infectious or inflammatory colitis. 3. Small amount of fluid in the pelvis, likely reactive. 4. Findings which likely represent hepatic steatosis. Aortic Atherosclerosis (ICD10-I70.0). These results were called by telephone at the time of interpretation on 08/01/2019 at 3:37 pm to provider [facility] , who verbally acknowledged these results. Electronically Signed   By: Zerita Boers M.D.   On: 08/01/2019 15:39   CT ABDOMEN PELVIS W CONTRAST  Result Date: 08/01/2019 CLINICAL DATA:  Unintended weight loss EXAM: CT CHEST, ABDOMEN AND PELVIS WITHOUT CONTRAST TECHNIQUE: Multidetector CT imaging of the chest, abdomen and pelvis was performed following the standard protocol without IV contrast. COMPARISON:  Chest radiograph dated 07/26/2019. FINDINGS: CT CHEST FINDINGS Cardiovascular: Acute pulmonary emboli involve the right and left main pulmonary arteries as well as the  lobar and segmental branches supplying all the lobes of both lungs. There is no evidence of right heart strain. Vascular calcifications are seen in the aortic arch. Normal heart size. No pericardial effusion. Mediastinum/Nodes: No enlarged mediastinal, hilar, or axillary lymph nodes. Thyroid gland, trachea, and esophagus demonstrate no significant findings. Lungs/Pleura: There is a trace left pleural effusion with associated atelectasis. There is mild dependent atelectasis. There is no pneumothorax or focal consolidation. Musculoskeletal: No chest wall mass or suspicious bone lesions identified. CT ABDOMEN PELVIS FINDINGS Hepatobiliary: The liver is hypoattenuating relative to the spleen, suggestive of hepatic steatosis. No focal liver abnormality is seen. No gallstones, gallbladder wall thickening, or biliary dilatation. Pancreas: Unremarkable. No pancreatic ductal dilatation or surrounding inflammatory changes. Spleen: Normal in size without focal abnormality. Adrenals/Urinary Tract: Adrenal glands are unremarkable. Kidneys are normal, without renal calculi, focal lesion, or hydronephrosis. Bladder is unremarkable. Stomach/Bowel: Stomach is within normal limits. Appendix appears normal. There is wall thickening with surrounding fat stranding and edema involving the ascending colon. There is no evidence of bowel obstruction. Vascular/Lymphatic: Aortic atherosclerosis. No enlarged abdominal or pelvic lymph nodes. Reproductive: Status post hysterectomy. No adnexal masses. Other: No abdominal wall hernia or abnormality. There is a small amount of fluid in the pelvis. Musculoskeletal: No acute or significant osseous findings. Degenerative changes are seen in the spine. IMPRESSION: 1. Acute pulmonary emboli involve the right and left main pulmonary arteries as well as the lobar and segmental branches supplying all the lobes of both lungs. No evidence of right heart strain. 2. Wall thickening with surrounding fat  stranding and edema involving the ascending colon, consistent with an infectious or inflammatory colitis. 3. Small amount of fluid in the pelvis, likely reactive. 4. Findings which likely represent hepatic steatosis. Aortic Atherosclerosis (ICD10-I70.0). These results were called by telephone at the  time of interpretation on 08/01/2019 at 3:37 pm to provider Hosie Poisson , who verbally acknowledged these results. Electronically Signed   By: Zerita Boers M.D.   On: 08/01/2019 15:39        Scheduled Meds:  atorvastatin  80 mg Oral Daily   clopidogrel  75 mg Oral Daily   insulin aspart  0-15 Units Subcutaneous TID WC   insulin aspart  0-5 Units Subcutaneous QHS   latanoprost  1 drop Both Eyes QHS   nicotine  21 mg Transdermal Daily   polyethylene glycol  17 g Oral BID   senna  1 tablet Oral QHS   sodium chloride flush  3 mL Intravenous Once   traZODone  100 mg Oral QHS   Continuous Infusions:  dextrose 5 % and 0.9% NaCl 75 mL/hr at 08/01/19 1958   heparin 1,000 Units/hr (08/02/19 0215)   piperacillin-tazobactam (ZOSYN)  IV 3.375 g (08/02/19 0519)     LOS: 6 days        Hosie Poisson, MD [facility]   To contact the attending provider between 7A-7P or the covering provider during after hours 7P-7A, please log into the web site [email or web address] and access using universal [place] password for that web site. If you do not have the password, please call the hospital operator.  08/02/2019, 10:16 AM

## 2019-08-02 NOTE — Progress Notes (Signed)
  Echocardiogram 2D Echocardiogram has been performed.  Delcie Roch 08/02/2019, 12:07 PM

## 2019-08-03 ENCOUNTER — Inpatient Hospital Stay (HOSPITAL_COMMUNITY): Payer: Self-pay

## 2019-08-03 DIAGNOSIS — B3781 Candidal esophagitis: Secondary | ICD-10-CM

## 2019-08-03 DIAGNOSIS — E440 Moderate protein-calorie malnutrition: Secondary | ICD-10-CM | POA: Insufficient documentation

## 2019-08-03 DIAGNOSIS — Z515 Encounter for palliative care: Secondary | ICD-10-CM

## 2019-08-03 LAB — BASIC METABOLIC PANEL
Anion gap: 11 (ref 5–15)
BUN: 11 mg/dL (ref 8–23)
CO2: 23 mmol/L (ref 22–32)
Calcium: 8.7 mg/dL — ABNORMAL LOW (ref 8.9–10.3)
Chloride: 109 mmol/L (ref 98–111)
Creatinine, Ser: 1.18 mg/dL — ABNORMAL HIGH (ref 0.44–1.00)
GFR calc Af Amer: 56 mL/min — ABNORMAL LOW (ref >60)
GFR calc non Af Amer: 49 mL/min — ABNORMAL LOW (ref >60)
Glucose, Bld: 105 mg/dL — ABNORMAL HIGH (ref 70–99)
Potassium: 3.6 mmol/L (ref 3.5–5.1)
Sodium: 143 mmol/L (ref 135–145)

## 2019-08-03 LAB — CBC
HCT: 42.5 % (ref 36.0–46.0)
Hemoglobin: 14.6 g/dL (ref 12.0–15.0)
MCH: 30.4 pg (ref 26.0–34.0)
MCHC: 34.4 g/dL (ref 30.0–36.0)
MCV: 88.4 fL (ref 80.0–100.0)
Platelets: 101 10*3/uL — ABNORMAL LOW (ref 150–400)
RBC: 4.81 MIL/uL (ref 3.87–5.11)
RDW: 13.6 % (ref 11.5–15.5)
WBC: 5 10*3/uL (ref 4.0–10.5)
nRBC: 0 % (ref 0.0–0.2)

## 2019-08-03 LAB — GLUCOSE, CAPILLARY
Glucose-Capillary: 105 mg/dL — ABNORMAL HIGH (ref 70–99)
Glucose-Capillary: 99 mg/dL (ref 70–99)
Glucose-Capillary: 119 mg/dL — ABNORMAL HIGH (ref 70–99)
Glucose-Capillary: 115 mg/dL — ABNORMAL HIGH (ref 70–99)

## 2019-08-03 LAB — MAGNESIUM: Magnesium: 1.8 mg/dL (ref 1.7–2.4)

## 2019-08-03 LAB — HEPARIN LEVEL (UNFRACTIONATED)
Heparin Unfractionated: 0.79 IU/mL — ABNORMAL HIGH (ref 0.30–0.70)
Heparin Unfractionated: 0.53 IU/mL (ref 0.30–0.70)

## 2019-08-03 LAB — PHOSPHORUS: Phosphorus: 2.5 mg/dL (ref 2.5–4.6)

## 2019-08-03 MED ORDER — NYSTATIN 100000 UNIT/ML MT SUSP
5.0000 mL | Freq: Four times a day (QID) | OROMUCOSAL | Status: DC
  Administered 2019-08-03 – 2019-09-04 (×115): 500000 [IU] via ORAL
  Filled 2019-08-03 (×113): qty 5

## 2019-08-03 MED ORDER — ENSURE ENLIVE PO LIQD
237.0000 mL | Freq: Four times a day (QID) | ORAL | Status: DC
  Administered 2019-08-03 – 2019-08-10 (×14): 237 mL via ORAL

## 2019-08-03 MED ORDER — FLUCONAZOLE 100MG IVPB
100.0000 mg | INTRAVENOUS | Status: DC
  Administered 2019-08-03 – 2019-08-08 (×6): 100 mg via INTRAVENOUS
  Filled 2019-08-03 (×8): qty 50

## 2019-08-03 NOTE — Progress Notes (Signed)
ANTICOAGULATION CONSULT NOTE   Pharmacy Consult for  IV heparin Indication: pulmonary embolus  Allergies  Allergen Reactions  . Sulfur     Pt doesn't remember reaction    Patient Measurements: Height: 5\' 2"  (157.5 cm) Weight: 179 lb 14.3 oz (81.6 kg) IBW/kg (Calculated) : 50.1 Heparin Dosing Weight: 68.3 kg  Vital Signs: Temp: 97.9 F (36.6 C) (03/29 1136) Temp Source: Oral (03/29 0817) BP: 145/82 (03/29 1136) Pulse Rate: 74 (03/29 1136)  Labs: Recent Labs    07/31/19 1847 08/01/19 0545 08/01/19 0545 08/02/19 0108 08/02/19 0739 08/02/19 1812 08/03/19 0322 08/03/19 1230  HGB  --  14.5   < > 14.1  --   --  14.6  --   HCT  --  43.5  --  41.1  --   --  42.5  --   PLT  --  101*  --  105*  --   --  101*  --   HEPARINUNFRC  --   --   --  0.89*   < > 1.02* 0.79* 0.53  CREATININE 0.96 1.01*  --   --   --   --  1.18*  --    < > = values in this interval not displayed.    Estimated Creatinine Clearance: 47.7 mL/min (A) (by C-G formula based on SCr of 1.18 mg/dL (H)).   Medical History: Past Medical History:  Diagnosis Date  . CVA (cerebral vascular accident) (HCC) 12/2018   And 03/2019, loop recorder inserted 03/2019  . Diabetes mellitus without complication (HCC)   . Hyperlipidemia   . Hypertension   . Obesity     Medications:  Infusions:  . heparin 600 Units/hr (08/03/19 0438)  . piperacillin-tazobactam (ZOSYN)  IV 3.375 g (08/03/19 0522)    Assessment: 64 yo female with PE seen on CT scan.  Pharmacy consulted 08/01/19 to begin IV heparin.  Baseline thrombocytopenia, Hgb WNL.  No anticoagulation noted PTA.  Heparin level is 0.53 , therapeutic after heparin decreased to 600 units/hr.  Hgb wnl/stable, pltc 105>101 (was 88 on admit 07/26/19), chronic thrombocytopenia noted.  No bleeding reported.  Goal of Therapy:  Heparin level 0.3-0.7 units/ml Monitor platelets by anticoagulation protocol: Yes   Plan:  Continue heparin at 600 units/hr Daily heparin  level and CBC  Thank you for allowing pharmacy to be part of this patients care team. 07/28/19, RPh Clinical Pharmacist Please check AMION for all [facility] Pharmacy phone numbers After 10:00 PM, call Main Pharmacy [phone] 08/03/2019  1:36 PM

## 2019-08-03 NOTE — Progress Notes (Signed)
  Speech Language Pathology Treatment: Dysphagia  Patient Details Name: Denise Santiago MRN: [id number] DOB: 28-Apr-1956 Today's Date: 08/03/2019 Time: 9678-9381 SLP Time Calculation (min) (ACUTE ONLY): 17 min  Assessment / Plan / Recommendation Clinical Impression  Pt seen with am meal. She is unable to self feed due to poor coordination of UE. She is also perseverating on the perception that she has something wrapped around her hands and tries to get it off. SLP addressed this repeatedly with visual feedback. With max cueing was able to redirect pt to bilateral grasp of cup to increase attention to thin liquid intake. She refused all foods on her tray except for applesauce. Oral holding observed. Full liquid diet may be the most realistic at this time, but liquids can additionally be given on current diet. Encouraged RN to frequently assist pt with sips of Ensure. Will f/u with the hopes cognition may improve for better Po intake.   HPI        SLP Plan  Continue with current plan of care       Recommendations  Diet recommendations: Thin liquid;Dysphagia 1 (puree) Liquids provided via: Cup;Straw Medication Administration: Crushed with puree Supervision: Full supervision/cueing for compensatory strategies;Trained caregiver to feed patient Compensations: Slow rate;Small sips/bites;Follow solids with liquid Postural Changes and/or Swallow Maneuvers: Seated upright 90 degrees                Oral Care Recommendations: Oral care BID Follow up Recommendations: 24 hour supervision/assistance SLP Visit Diagnosis: Dysphagia, unspecified (R13.10) Plan: Continue with current plan of care       GO                Keimani Laufer, Riley Nearing 08/03/2019, 1:49 PM

## 2019-08-03 NOTE — Progress Notes (Signed)
Initial Nutrition Assessment  DOCUMENTATION CODES:   Non-severe (moderate) malnutrition in context of chronic illness  INTERVENTION:   Calorie count per MD  1:1 feeding assistance   Ensure Enlive po QID, each supplement provides 350 kcal and 20 grams of protein  Magic cup TID with meals, each supplement provides 290 kcal and 9 grams of protein    NUTRITION DIAGNOSIS:   Moderate Malnutrition related to chronic illness(stroke) as evidenced by energy intake < 75% for > or equal to 1 month, percent weight loss, edema, mild fat depletion, mild muscle depletion.  GOAL:   Patient will meet greater than or equal to 90% of their needs   MONITOR:   PO intake, Supplement acceptance, Weight trends, Skin, Labs  REASON FOR ASSESSMENT:   Consult Calorie Count  ASSESSMENT:   Pt with a PMH significant for HTN, DM, HLD, CVA presented to ED with generalized weakness and fall.  Discussed pt with RN. Per RN, pt's family has reported that pt prefers spaghetti and is not likely to consume meal options provided. RN contacted diet office regarding possibility of pureed spaghetti, but was informed that this is not an option.   Per palliative care, pt has indicated that she would not like a feeding tube.   Pt reports altered taste and poor appetite since her CVA in November 2020. Pt also endorses a 50lb weight loss since that time. Per weight readings in chart, pt has had an 18% wt loss x4 months, which is significant for time frame.   PO Intake: 0-10% x 4 recorded meals  Medications reviewed and include: SSI, Miralax, Senokot  Labs reviewed: CBGs 99-122   NUTRITION - FOCUSED PHYSICAL EXAM:    Most Recent Value  Orbital Region  No depletion  Upper Arm Region  Mild depletion  Thoracic and Lumbar Region  Mild depletion  Buccal Region  No depletion  Temple Region  Mild depletion  Clavicle Bone Region  Mild depletion  Clavicle and Acromion Bone Region  Mild depletion  Scapular Bone  Region  No depletion  Dorsal Hand  No depletion  Patellar Region  No depletion  Anterior Thigh Region  Mild depletion  Posterior Calf Region  Mild depletion  Edema (RD Assessment)  Mild  Hair  Reviewed  Eyes  Reviewed  Mouth  Reviewed  Skin  Reviewed  Nails  Reviewed       Diet Order:   Diet Order            DIET - DYS 1 Room service appropriate? Yes; Fluid consistency: Thin  Diet effective now              EDUCATION NEEDS:   Not appropriate for education at this time  Skin:  Skin Assessment: Skin Integrity Issues: Skin Integrity Issues:: Other (Comment) Other: Non-pressure wound buttocks  Last BM:  07/31/19  Height:   Ht Readings from Last 1 Encounters:  08/01/19 5\' 2"  (1.575 m)    Weight:   Wt Readings from Last 1 Encounters:  08/01/19 81.6 kg   BMI:  Body mass index is 32.9 kg/m.  Estimated Nutritional Needs:   Kcal:  1750-1950  Protein:  110-125 grams  Fluid:  >1.75 L    08/03/19, MS, RD, LDN RD pager number and weekend/on-call pager number located in [employer].

## 2019-08-03 NOTE — Progress Notes (Signed)
Physical Therapy Treatment Patient Details Name: Denise Santiago MRN: [id number] DOB: 1955/07/17 Today's Date: 08/03/2019    History of Present Illness 64 y.o. female with a hx of DM, HTN, HLD, CVA 12/2018 & 03/2019, ongoing tob use who presented to ED with generalized weakness and elevated troponin, thought to be non specific by cardiology. pt reports weakness after COVID vaccine 07/16/19. The day prior to admission pt slid out of chair to floor and remained through the night.     PT Comments    Pt supine in bed.  She reports she can get up and walk with her walker, but functionally she continues to decline.  Performed standing trials at edge of bed and she buckled with B LEs.  She required face to face squat pivot for OOB.  Continue to recommend placement in a post acute setting before return home.     Follow Up Recommendations  Supervision/Assistance - 24 hour;SNF     Equipment Recommendations  None recommended by PT    Recommendations for Other Services       Precautions / Restrictions Precautions Precautions: Fall Precaution Comments: reports falls since COVID vaccine Restrictions Weight Bearing Restrictions: No    Mobility  Bed Mobility Overal bed mobility: Needs Assistance Bed Mobility: Supine to Sit;Sit to Supine     Supine to sit: Max assist;+2 for physical assistance     General bed mobility comments: Max +2 to move from supine to sit with use of chux pad to scoot forward.  Pt with posterior pelvice tilt and posterior lean in sitting.  Mod to max assistance to maintain sitting.  Transfers Overall transfer level: Needs assistance Equipment used: Rolling walker (2 wheeled) Transfers: Sit to/from [employer] Sit to Stand: +2 physical assistance;Max assist;Total assist   Squat pivot transfers: Total assist;+2 physical assistance     General transfer comment: Pt performed transfer with RW at edge of bed and able to stand for about 20 secs before  slowly sitting.  During standing require cues for postural corrections.  Pt attempted additional sit to stand and began to buckle required total assistance to correct balance and sit back down on the bed.  To move from bed to recliner required +2 total for squat pivot.  Ambulation/Gait Ambulation/Gait assistance: (UNable)               Stairs             Wheelchair Mobility    Modified Rankin (Stroke Patients Only)       Balance Overall balance assessment: Needs assistance Sitting-balance support: Bilateral upper extremity supported;Feet supported Sitting balance-Leahy Scale: Poor Sitting balance - Comments: required mod - max A to maintain sitting EOB     Standing balance-Leahy Scale: Poor Standing balance comment: unable to stand without UE support                            Cognition Arousal/Alertness: Lethargic Behavior During Therapy: Flat affect Overall Cognitive Status: No family/caregiver present to determine baseline cognitive functioning Area of Impairment: Safety/judgement;Awareness;Following commands                     Memory: Decreased short-term memory Following Commands: Follows one step commands with increased time;Follows one step commands inconsistently Safety/Judgement: Decreased awareness of safety;Decreased awareness of deficits   Problem Solving: Slow processing;Difficulty sequencing;Decreased initiation        Exercises      General Comments  Pertinent Vitals/Pain Pain Assessment: No/denies pain Faces Pain Scale: No hurt    Home Living                      Prior Function            PT Goals (current goals can now be found in the care plan section) Acute Rehab PT Goals Patient Stated Goal: to get stronger Potential to Achieve Goals: Good Progress towards PT goals: Progressing toward goals    Frequency    Min 3X/week      PT Plan Current plan remains appropriate     Co-evaluation              AM-PAC PT "6 Clicks" Mobility   Outcome Measure  Help needed turning from your back to your side while in a flat bed without using bedrails?: Total Help needed moving from lying on your back to sitting on the side of a flat bed without using bedrails?: Total Help needed moving to and from a bed to a chair (including a wheelchair)?: Total Help needed standing up from a chair using your arms (e.g., wheelchair or bedside chair)?: Total Help needed to walk in hospital room?: Total Help needed climbing 3-5 steps with a railing? : Total 6 Click Score: 6    End of Session Equipment Utilized During Treatment: Gait belt Activity Tolerance: Patient limited by fatigue;Patient limited by lethargy Patient left: with call bell/phone within reach;in chair;with chair alarm set Nurse Communication: Mobility status PT Visit Diagnosis: Unsteadiness on feet (R26.81);Muscle weakness (generalized) (M62.81);Repeated falls (R29.6);Difficulty in walking, not elsewhere classified (R26.2)     Time: 9924-2683 PT Time Calculation (min) (ACUTE ONLY): 28 min  Charges:  $Therapeutic Activity: 23-37 mins                     Bonney Leitz , PTA Acute Rehabilitation Services Pager [phone] Office [phone]    Denise Santiago Delay 08/03/2019, 2:27 PM

## 2019-08-03 NOTE — Progress Notes (Signed)
PROGRESS NOTE    Denise Santiago  GNF:[id number] DOB: 11-14-1955 DOA: 07/26/2019 PCP: Seward Carol, MD    Brief Narrative:   64 year old woman with prior history of hypertension, diabetes, hyperlipidemia, CVA presents to ED for generalized weakness and fall.  On arrival to ED her labs were abnormal for elevated troponin, hypokalemia and hypomagnesemia.  Electrolytes were all repleted and therapy evaluations are recommending SNF at this time.  She continued to have generalized weakness , some dysphagia n the setting of multiple strokes. It was followed with an MRI brain,  showing No evidence of acute intracranial abnormality. Stable chronic ischemic changes including multifocal chronic cortically based infarcts within the right cerebral hemisphere and multiple chronic small vessel lacunar infarcts.  As per the sister patient has lost more than 50 pounds since November after her last stroke. On further questioning, pt reports that she has altered taste and no appetite whatso ever, along with loose teeth , preventing her to eat any solid food. Malignancy work up has been initiated and CT abd , pelvis and chest with contrast ordered for further evaluation.  CT of the chest with contrast showed acute bilateral pulmonary embolism and CT of the abdomen showed ascending colon colitis.  Venous duplex of the lower extremities show acute DVT involving a single left posterior tibial vein.  She was started on IV heparin for acute PE and IV Zosyn for colitis.  Meanwhile I ordered esophagogram was ordered for evaluation of dysphagia.  SLP evaluation done recommended regular diet with thin liquids.  Patient seen and examined at bedside, sitting in the chair and cleaning up   Assessment & Plan:   Principal Problem:   Major depressive disorder Active Problems:   Essential hypertension   Type 2 diabetes mellitus without complication (HCC)   CVA (cerebral vascular accident) ([place])   Hyperlipidemia  Generalized weakness   Hypokalemia   Hyponatremia   Acute on chronic kidney failure (HCC)   Thrombocytopenia (HCC)   Elevated troponin   Lactic acidosis   AKI (acute kidney injury) ([place])   Palliative care by specialist   Goals of care, counseling/discussion   Xerostomia   Dry lips   Failure to thrive in adult   Physical debility   Generalized weakness , adult failure to thrive Probably secondary to electrolyte abnormalities and general deconditioning due to multiple strokes in the past.  Initial CT of the head shows old right posterior parietal infarct without any acute intracranial abnormalities. MRI brain shows No evidence of acute intracranial abnormality. Stable chronic ischemic changes including multifocal chronic cortically based infarcts within the right cerebral hemisphere and multiple chronic small vessel lacunar infarcts. PT evaluation recommending CIR but as patient does not have enough support/caregiver assistance at home and family is inclined towards SNF as per inpatient rehab admission coordinator.  TOC consulted for SNF placement at this time.  Due to lack of insurance,  placement to SNF has been difficult    Adult failure to thrive Probably secondary to deconditioning and poor appetite. Differential include depression post stroke vs general deconditioning  Psychiatry consulted for further evaluation of depression.  She was started on trazodone nightly. Further evaluation for malignancy ordered,  Tumour markers are negative.   CT chest with contrast  Shows Acute pulmonary emboli involve the right and left main pulmonary arteries as well as the lobar and segmental branches supplying all the lobes of both lungs. No evidence of right heart strain.  CT abdomen showed ascending colon colitis.  No evidence of  malignancy at this time.   Ascending colon colitis:  Currently on IV Zosyn for colitis.  Patient denies any nausea or vomiting and abdominal pain.  Plan to quickly  transition to oral antibiotics.   Bilateral pulmonary embolism Patient was started on IV heparin plan to transition to oral Eliquis in the next 24 hours. Venous duplex of the lower extremities shows acute DVT involving the single left posterior tibial vein. Echocardiogram ordered showed left ventricular ejection fraction of 60 to 65% without any regional wall abnormalities.  Left ventricular diastolic parameters are consistent with grade 1 diastolic dysfunction.  No right heart strain seen.  AKI superimposed on stage II CKD Creatinine improving with hydration.  Baseline creatinine appears to be around 1.1.  Repeat creatinine at 1.1.   Essential hypertension Blood pressure parameters are better when compared to the last 2 days. She is started on lisinopril and continue with hydralazine as needed.    Type 2 diabetes mellitus controlled.  Hemoglobin A1c is 6.9. CBG (last 3)  Recent Labs    08/03/19 0400 08/03/19 0816 08/03/19 1132  GLUCAP 105* 99 119*    CBGs are well controlled, continue with sliding scale insulin.     History of recent CVA Continue with Plavix and statin    Hyperlipidemia Continue with statin.    Lactic acidosis In the setting of dehydration. Resolved   UTI:  Completed the course of IV Rocephin.  Her urine looks cloudy.  Repeat UA.   Chronic thrombocytopenia Platelets have been low in the past . No obvious signs of bleeding.   Constipation  Resolved   Poor oral hygiene, loose teeth in the lower part of the mouth Missing dentures Dental consult requested and x-rays ordered.   DVT prophylaxis: Heparin Code Status: DNR Family Communication: None at bedside Disposition Plan:  . Patient came from: Home            . Anticipated d/c place: SNF . Barriers to d/c OR conditions which need to be met to effect a safe d/c: TOC consult in place for SNF placement, difficult placement due to lack of insurance.   Consultants:    CIR   Procedures: Echocardiogram.    Antimicrobials: ROCEPHIN.    Subjective: Patient denies any nausea vomiting or abdominal pain patient denies any chest pain or shortness of breath. Objective: Vitals:   08/02/19 2336 08/03/19 0401 08/03/19 0817 08/03/19 1136  BP: (!) 155/73 139/81 (!) 154/81 (!) 145/82  Pulse: 70 83 81 74  Resp: '18 17 20 16  '$ Temp: 97.7 F (36.5 C) 98.4 F (36.9 C) 98.4 F (36.9 C) 97.9 F (36.6 C)  TempSrc: Oral Oral Oral   SpO2: 97% 100% 99% 100%  Weight:      Height:       No intake or output data in the 24 hours ending 08/03/19 1217 Filed Weights   07/31/19 0416 08/01/19 0500 08/01/19 1700  Weight: 81.1 kg 81.6 kg 81.6 kg    Examination:  General exam: Alert sitting in the chair and comfortable Respiratory system: Clear to auscultation bilaterally, no wheezing or rhonchi Cardiovascular system: S1-S2 heard, regular rate rhythm, no JVD, no pedal edema Gastrointestinal system: Abdomen is soft, nontender nondistended bowel sounds normal Central nervous system:.  Alert and answering questions and following commands. Extremities: No LE edema, cyanosis or clubbing Skin: No rashes seen psychiatry: Flat affect.  Data Reviewed: I have personally reviewed following labs and imaging studies  CBC: Recent Labs  Lab 07/28/19 0358 07/29/19 0351  08/01/19 0545 08/02/19 0108 08/03/19 0322  WBC 5.7 4.8 4.8 4.9 5.0  NEUTROABS  --  3.4  --   --   --   HGB 13.9 14.1 14.5 14.1 14.6  HCT 40.2 41.5 43.5 41.1 42.5  MCV 88.7 89.1 91.4 88.8 88.4  PLT 97* 110* 101* 105* 599*   Basic Metabolic Panel: Recent Labs  Lab 07/29/19 0351 07/30/19 0754 07/31/19 1847 08/01/19 0545 08/03/19 0322  NA 137 139 140 139 143  K 4.6 4.9 4.2 4.0 3.6  CL 107 109 108 108 109  CO2 20* 20* 22 20* 23  GLUCOSE 104* 104* 84 121* 105*  BUN '14 17 15 12 11  '$ CREATININE 1.11* 1.18* 0.96 1.01* 1.18*  CALCIUM 8.9 8.9 9.0 8.7* 8.7*  MG  --  2.0 1.8  --  1.8  PHOS  --   --   1.9*  --  2.5   GFR: Estimated Creatinine Clearance: 47.7 mL/min (A) (by C-G formula based on SCr of 1.18 mg/dL (H)). Liver Function Tests: No results for input(s): AST, ALT, ALKPHOS, BILITOT, PROT, ALBUMIN in the last 168 hours. No results for input(s): LIPASE, AMYLASE in the last 168 hours. No results for input(s): AMMONIA in the last 168 hours. Coagulation Profile: No results for input(s): INR, PROTIME in the last 168 hours. Cardiac Enzymes: No results for input(s): CKTOTAL, CKMB, CKMBINDEX, TROPONINI in the last 168 hours. BNP (last 3 results) No results for input(s): PROBNP in the last 8760 hours. HbA1C: No results for input(s): HGBA1C in the last 72 hours. CBG: Recent Labs  Lab 08/02/19 1944 08/02/19 2335 08/03/19 0400 08/03/19 0816 08/03/19 1132  GLUCAP 122* 105* 105* 99 119*   Lipid Profile: No results for input(s): CHOL, HDL, LDLCALC, TRIG, CHOLHDL, LDLDIRECT in the last 72 hours. Thyroid Function Tests: No results for input(s): TSH, T4TOTAL, FREET4, T3FREE, THYROIDAB in the last 72 hours. Anemia Panel: No results for input(s): VITAMINB12, FOLATE, FERRITIN, TIBC, IRON, RETICCTPCT in the last 72 hours. Sepsis Labs: No results for input(s): PROCALCITON, LATICACIDVEN in the last 168 hours.  Recent Results (from the past 240 hour(s))  Culture, blood (Routine X 2) w Reflex to ID Panel     Status: None   Collection Time: 07/26/19 11:04 AM   Specimen: BLOOD LEFT HAND  Result Value Ref Range Status   Specimen Description BLOOD LEFT HAND  Final   Special Requests   Final    BOTTLES DRAWN AEROBIC AND ANAEROBIC Blood Culture adequate volume   Culture   Final    NO GROWTH 5 DAYS Performed at [facility], [address], [place], [place] [address]    Report Status 07/31/2019 FINAL  Final  SARS CORONAVIRUS 2 (TAT 6-24 HRS) Nasopharyngeal Nasopharyngeal Swab     Status: None   Collection Time: 07/26/19 12:44 PM   Specimen: Nasopharyngeal Swab  Result Value Ref  Range Status   SARS Coronavirus 2 NEGATIVE NEGATIVE Final    Comment: (NOTE) SARS-CoV-2 target nucleic acids are NOT DETECTED. The SARS-CoV-2 RNA is generally detectable in upper and lower respiratory specimens during the acute phase of infection. Negative results do not preclude SARS-CoV-2 infection, do not rule out co-infections with other pathogens, and should not be used as the sole basis for treatment or other patient management decisions. Negative results must be combined with clinical observations, patient history, and epidemiological information. The expected result is Negative. Fact Sheet for Patients: SugarRoll.be Fact Sheet for Healthcare Providers: https://www.woods-mathews.com/ This test is not yet  approved or cleared by the [place] and  has been authorized for detection and/or diagnosis of SARS-CoV-2 by FDA under an Emergency Use Authorization (EUA). This EUA will remain  in effect (meaning this test can be used) for the duration of the COVID-19 declaration under Section 56 4(b)(1) of the Act, 21 U.S.C. section 360bbb-3(b)(1), unless the authorization is terminated or revoked sooner. Performed at [facility], [place] [address], [place], [place] [address]   Culture, blood (Routine X 2) w Reflex to ID Panel     Status: None   Collection Time: 07/26/19  5:09 PM   Specimen: BLOOD LEFT HAND  Result Value Ref Range Status   Specimen Description BLOOD LEFT HAND  Final   Special Requests   Final    BOTTLES DRAWN AEROBIC ONLY Blood Culture results may not be optimal due to an inadequate volume of blood received in culture bottles   Culture   Final    NO GROWTH 5 DAYS Performed at [facility], [place] [address] [place], [place] [address]    Report Status 07/31/2019 FINAL  Final  Urine Culture     Status: Abnormal   Collection Time: 07/27/19  6:20 AM   Specimen: Urine, Random  Result Value Ref Range Status    Specimen Description URINE, RANDOM  Final   Special Requests   Final    NONE Performed at [facility], [place] [address] [place], [place] [address]    Culture 80,000 COLONIES/mL ESCHERICHIA COLI (A)  Final   Report Status 07/29/2019 FINAL  Final   Organism ID, Bacteria ESCHERICHIA COLI (A)  Final      Susceptibility   Escherichia coli - MIC*    AMPICILLIN <=2 SENSITIVE Sensitive     CEFAZOLIN <=4 SENSITIVE Sensitive     CEFTRIAXONE <=0.25 SENSITIVE Sensitive     CIPROFLOXACIN <=0.25 SENSITIVE Sensitive     GENTAMICIN <=1 SENSITIVE Sensitive     IMIPENEM <=0.25 SENSITIVE Sensitive     NITROFURANTOIN <=16 SENSITIVE Sensitive     TRIMETH/SULFA <=20 SENSITIVE Sensitive     AMPICILLIN/SULBACTAM <=2 SENSITIVE Sensitive     * 80,000 COLONIES/mL ESCHERICHIA COLI         Radiology Studies: CT CHEST W CONTRAST  Result Date: 08/01/2019 CLINICAL DATA:  Unintended weight loss EXAM: CT CHEST, ABDOMEN AND PELVIS WITHOUT CONTRAST TECHNIQUE: Multidetector CT imaging of the chest, abdomen and pelvis was performed following the standard protocol without IV contrast. COMPARISON:  Chest radiograph dated 07/26/2019. FINDINGS: CT CHEST FINDINGS Cardiovascular: Acute pulmonary emboli involve the right and left main pulmonary arteries as well as the lobar and segmental branches supplying all the lobes of both lungs. There is no evidence of right heart strain. Vascular calcifications are seen in the aortic arch. Normal heart size. No pericardial effusion. Mediastinum/Nodes: No enlarged mediastinal, hilar, or axillary lymph nodes. Thyroid gland, trachea, and esophagus demonstrate no significant findings. Lungs/Pleura: There is a trace left pleural effusion with associated atelectasis. There is mild dependent atelectasis. There is no pneumothorax or focal consolidation. Musculoskeletal: No chest wall mass or suspicious bone lesions identified. CT ABDOMEN PELVIS FINDINGS Hepatobiliary: The liver is  hypoattenuating relative to the spleen, suggestive of hepatic steatosis. No focal liver abnormality is seen. No gallstones, gallbladder wall thickening, or biliary dilatation. Pancreas: Unremarkable. No pancreatic ductal dilatation or surrounding inflammatory changes. Spleen: Normal in size without focal abnormality. Adrenals/Urinary Tract: Adrenal glands are unremarkable. Kidneys are normal, without renal calculi, focal lesion, or  hydronephrosis. Bladder is unremarkable. Stomach/Bowel: Stomach is within normal limits. Appendix appears normal. There is wall thickening with surrounding fat stranding and edema involving the ascending colon. There is no evidence of bowel obstruction. Vascular/Lymphatic: Aortic atherosclerosis. No enlarged abdominal or pelvic lymph nodes. Reproductive: Status post hysterectomy. No adnexal masses. Other: No abdominal wall hernia or abnormality. There is a small amount of fluid in the pelvis. Musculoskeletal: No acute or significant osseous findings. Degenerative changes are seen in the spine. IMPRESSION: 1. Acute pulmonary emboli involve the right and left main pulmonary arteries as well as the lobar and segmental branches supplying all the lobes of both lungs. No evidence of right heart strain. 2. Wall thickening with surrounding fat stranding and edema involving the ascending colon, consistent with an infectious or inflammatory colitis. 3. Small amount of fluid in the pelvis, likely reactive. 4. Findings which likely represent hepatic steatosis. Aortic Atherosclerosis (ICD10-I70.0). These results were called by telephone at the time of interpretation on 08/01/2019 at 3:37 pm to provider [facility] , who verbally acknowledged these results. Electronically Signed   By: Zerita Boers M.D.   On: 08/01/2019 15:39   CT ABDOMEN PELVIS W CONTRAST  Result Date: 08/01/2019 CLINICAL DATA:  Unintended weight loss EXAM: CT CHEST, ABDOMEN AND PELVIS WITHOUT CONTRAST TECHNIQUE: Multidetector CT  imaging of the chest, abdomen and pelvis was performed following the standard protocol without IV contrast. COMPARISON:  Chest radiograph dated 07/26/2019. FINDINGS: CT CHEST FINDINGS Cardiovascular: Acute pulmonary emboli involve the right and left main pulmonary arteries as well as the lobar and segmental branches supplying all the lobes of both lungs. There is no evidence of right heart strain. Vascular calcifications are seen in the aortic arch. Normal heart size. No pericardial effusion. Mediastinum/Nodes: No enlarged mediastinal, hilar, or axillary lymph nodes. Thyroid gland, trachea, and esophagus demonstrate no significant findings. Lungs/Pleura: There is a trace left pleural effusion with associated atelectasis. There is mild dependent atelectasis. There is no pneumothorax or focal consolidation. Musculoskeletal: No chest wall mass or suspicious bone lesions identified. CT ABDOMEN PELVIS FINDINGS Hepatobiliary: The liver is hypoattenuating relative to the spleen, suggestive of hepatic steatosis. No focal liver abnormality is seen. No gallstones, gallbladder wall thickening, or biliary dilatation. Pancreas: Unremarkable. No pancreatic ductal dilatation or surrounding inflammatory changes. Spleen: Normal in size without focal abnormality. Adrenals/Urinary Tract: Adrenal glands are unremarkable. Kidneys are normal, without renal calculi, focal lesion, or hydronephrosis. Bladder is unremarkable. Stomach/Bowel: Stomach is within normal limits. Appendix appears normal. There is wall thickening with surrounding fat stranding and edema involving the ascending colon. There is no evidence of bowel obstruction. Vascular/Lymphatic: Aortic atherosclerosis. No enlarged abdominal or pelvic lymph nodes. Reproductive: Status post hysterectomy. No adnexal masses. Other: No abdominal wall hernia or abnormality. There is a small amount of fluid in the pelvis. Musculoskeletal: No acute or significant osseous findings.  Degenerative changes are seen in the spine. IMPRESSION: 1. Acute pulmonary emboli involve the right and left main pulmonary arteries as well as the lobar and segmental branches supplying all the lobes of both lungs. No evidence of right heart strain. 2. Wall thickening with surrounding fat stranding and edema involving the ascending colon, consistent with an infectious or inflammatory colitis. 3. Small amount of fluid in the pelvis, likely reactive. 4. Findings which likely represent hepatic steatosis. Aortic Atherosclerosis (ICD10-I70.0). These results were called by telephone at the time of interpretation on 08/01/2019 at 3:37 pm to provider [facility] , who verbally acknowledged these results. Electronically Signed  By: Zerita Boers M.D.   On: 08/01/2019 15:39   ECHOCARDIOGRAM COMPLETE  Result Date: 08/02/2019    ECHOCARDIOGRAM REPORT   Patient Name:   Denise Santiago Date of Exam: 08/02/2019 Medical Rec #:  [id number]        Height:       62.0 in Accession #:    [id number]       Weight:       179.9 lb Date of Birth:  1956/01/03        BSA:          1.828 m Patient Age:    62 years         BP:           159/84 mmHg Patient Gender: F                HR:           71 bpm. Exam Location:  Inpatient Procedure: 2D Echo Indications:    stroke  History:        Patient has prior history of Echocardiogram examinations, most                 recent 03/20/2019. Signs/Symptoms:elevated troponin; Risk                 Factors:Diabetes, Dyslipidemia and Current Smoker.  Sonographer:    Johny Chess Referring Phys: Lewie Chamber Omare Bilotta IMPRESSIONS  1. Left ventricular ejection fraction, by estimation, is 60 to 65%. The left ventricle has normal function. The left ventricle has no regional wall motion abnormalities. There is mild left ventricular hypertrophy. Left ventricular diastolic parameters are consistent with Grade I diastolic dysfunction (impaired relaxation).  2. Right ventricular systolic function is normal. The  right ventricular size is normal. Tricuspid regurgitation signal is inadequate for assessing PA pressure.  3. The mitral valve is normal in structure. No evidence of mitral valve regurgitation.  4. The aortic valve is tricuspid. Aortic valve regurgitation is not visualized. No aortic stenosis is present.  5. The inferior vena cava is normal in size with greater than 50% respiratory variability, suggesting right atrial pressure of 3 mmHg. FINDINGS  Left Ventricle: Left ventricular ejection fraction, by estimation, is 60 to 65%. The left ventricle has normal function. The left ventricle has no regional wall motion abnormalities. The left ventricular internal cavity size was normal in size. There is  mild left ventricular hypertrophy. Left ventricular diastolic parameters are consistent with Grade I diastolic dysfunction (impaired relaxation). Right Ventricle: The right ventricular size is normal. No increase in right ventricular wall thickness. Right ventricular systolic function is normal. Tricuspid regurgitation signal is inadequate for assessing PA pressure. Left Atrium: Left atrial size was normal in size. Right Atrium: Right atrial size was normal in size. Pericardium: Trivial pericardial effusion is present. Mitral Valve: The mitral valve is normal in structure. No evidence of mitral valve regurgitation. Tricuspid Valve: The tricuspid valve is normal in structure. Tricuspid valve regurgitation is trivial. Aortic Valve: The aortic valve is tricuspid. Aortic valve regurgitation is not visualized. No aortic stenosis is present. Pulmonic Valve: The pulmonic valve was not well visualized. Pulmonic valve regurgitation is not visualized. Aorta: The aortic root is normal in size and structure. Venous: The inferior vena cava is normal in size with greater than 50% respiratory variability, suggesting right atrial pressure of 3 mmHg. IAS/Shunts: The interatrial septum was not well visualized.  LEFT VENTRICLE PLAX 2D LVIDd:  3.70 cm  Diastology LVIDs:         2.40 cm  LV e' lateral:   6.20 cm/s LV PW:         1.10 cm  LV E/e' lateral: 7.7 LV IVS:        0.90 cm  LV e' medial:    5.87 cm/s LVOT diam:     1.60 cm  LV E/e' medial:  8.2 LV SV:         42 LV SV Index:   23 LVOT Area:     2.01 cm  RIGHT VENTRICLE RV S prime:     15.00 cm/s TAPSE (M-mode): 1.5 cm LEFT ATRIUM             Index       RIGHT ATRIUM           Index LA diam:        2.60 cm 1.42 cm/m  RA Area:     11.80 cm LA Vol (A2C):   33.4 ml 18.28 ml/m RA Volume:   28.30 ml  15.49 ml/m LA Vol (A4C):   34.5 ml 18.88 ml/m LA Biplane Vol: 34.4 ml 18.82 ml/m  AORTIC VALVE LVOT Vmax:   104.00 cm/s LVOT Vmean:  71.900 cm/s LVOT VTI:    0.209 m  AORTA Ao Root diam: 2.80 cm MITRAL VALVE MV Area (PHT): 2.87 cm     SHUNTS MV Decel Time: 264 msec     Systemic VTI:  0.21 m MV E velocity: 48.00 cm/s   Systemic Diam: 1.60 cm MV A velocity: 104.00 cm/s MV E/A ratio:  0.46 Oswaldo Milian MD Electronically signed by Oswaldo Milian MD Signature Date/Time: 08/02/2019/2:26:03 PM    Final    VAS [place] LOWER EXTREMITY VENOUS (DVT)  Result Date: 08/02/2019  Lower Venous DVTStudy Indications: Pulmonary embolism.  Limitations: Body habitus, poor ultrasound/tissue interface and patient sitting upright. Comparison Study: No prior study Performing Technologist: Maudry Mayhew MHA, RDMS, RVT, RDCS  Examination Guidelines: A complete evaluation includes B-mode imaging, spectral Doppler, color Doppler, and power Doppler as needed of all accessible portions of each vessel. Bilateral testing is considered an integral part of a complete examination. Limited examinations for reoccurring indications may be performed as noted. The reflux portion of the exam is performed with the patient in reverse Trendelenburg.  +---------+---------------+---------+-----------+----------+--------------+ RIGHT    CompressibilityPhasicitySpontaneityPropertiesThrombus Aging  +---------+---------------+---------+-----------+----------+--------------+ CFV      Full           Yes      Yes                                 +---------+---------------+---------+-----------+----------+--------------+ SFJ      Full                                                        +---------+---------------+---------+-----------+----------+--------------+ FV Prox  Full                                                        +---------+---------------+---------+-----------+----------+--------------+ FV Mid   Full                                                        +---------+---------------+---------+-----------+----------+--------------+  FV DistalFull                                                        +---------+---------------+---------+-----------+----------+--------------+ PFV      Full                                                        +---------+---------------+---------+-----------+----------+--------------+ POP      Full           Yes      Yes                                 +---------+---------------+---------+-----------+----------+--------------+ PTV      Full                                                        +---------+---------------+---------+-----------+----------+--------------+ PERO     Full                                                        +---------+---------------+---------+-----------+----------+--------------+   +---------+---------------+---------+-----------+----------+--------------+ LEFT     CompressibilityPhasicitySpontaneityPropertiesThrombus Aging +---------+---------------+---------+-----------+----------+--------------+ CFV      Full           Yes      Yes                                 +---------+---------------+---------+-----------+----------+--------------+ SFJ      Full                                                         +---------+---------------+---------+-----------+----------+--------------+ FV Prox  Full                                                        +---------+---------------+---------+-----------+----------+--------------+ FV Mid   Full                                                        +---------+---------------+---------+-----------+----------+--------------+ FV DistalFull                                                        +---------+---------------+---------+-----------+----------+--------------+  PFV      Full                                                        +---------+---------------+---------+-----------+----------+--------------+ POP      Full           Yes      Yes                                 +---------+---------------+---------+-----------+----------+--------------+ PTV      None                    No                   Acute          +---------+---------------+---------+-----------+----------+--------------+   Left Technical Findings: Not visualized segments include peroneal veins.   Summary: RIGHT: - There is no evidence of deep vein thrombosis in the lower extremity.  - No cystic structure found in the popliteal fossa.  LEFT: - Findings consistent with acute deep vein thrombosis involving a single left posterior tibial vein. - No cystic structure found in the popliteal fossa.  *See table(s) above for measurements and observations. Electronically signed by Deitra Mayo MD on 08/02/2019 at 6:35:21 PM.    Final         Scheduled Meds: . antiseptic oral rinse  15 mL Mouth Rinse BID  . atorvastatin  80 mg Oral Daily  . clopidogrel  75 mg Oral Daily  . insulin aspart  0-15 Units Subcutaneous TID WC  . insulin aspart  0-5 Units Subcutaneous QHS  . latanoprost  1 drop Both Eyes QHS  . lisinopril  20 mg Oral Daily  . nicotine  21 mg Transdermal Daily  . polyethylene glycol  17 g Oral BID  . senna  1 tablet Oral QHS  . sodium chloride  flush  3 mL Intravenous Once  . traZODone  100 mg Oral QHS   Continuous Infusions: . heparin 600 Units/hr (08/03/19 0438)  . piperacillin-tazobactam (ZOSYN)  IV 3.375 g (08/03/19 0522)     LOS: 7 days        Hosie Poisson, MD [facility]   To contact the attending provider between 7A-7P or the covering provider during after hours 7P-7A, please log into the web site [email or web address] and access using universal [place] password for that web site. If you do not have the password, please call the hospital operator.  08/03/2019, 12:17 PM

## 2019-08-03 NOTE — Progress Notes (Signed)
ANTICOAGULATION CONSULT NOTE   Pharmacy Consult for  IV heparin Indication: pulmonary embolus  Allergies  Allergen Reactions  . Sulfur     Pt doesn't remember reaction    Patient Measurements: Height: 5\' 2"  (157.5 cm) Weight: 179 lb 14.3 oz (81.6 kg) IBW/kg (Calculated) : 50.1 Heparin Dosing Weight: 68.3 kg  Vital Signs: Temp: 98.4 F (36.9 C) (03/29 0401) Temp Source: Oral (03/29 0401) BP: 139/81 (03/29 0401) Pulse Rate: 83 (03/29 0401)  Labs: Recent Labs    07/31/19 1847 08/01/19 0545 08/01/19 0545 08/02/19 0108 08/02/19 0108 08/02/19 0739 08/02/19 1812 08/03/19 0322  HGB  --  14.5   < > 14.1  --   --   --  14.6  HCT  --  43.5  --  41.1  --   --   --  42.5  PLT  --  101*  --  105*  --   --   --  101*  HEPARINUNFRC  --   --   --  0.89*   < > 1.26* 1.02* 0.79*  CREATININE 0.96 1.01*  --   --   --   --   --  1.18*   < > = values in this interval not displayed.    Estimated Creatinine Clearance: 47.7 mL/min (A) (by C-G formula based on SCr of 1.18 mg/dL (H)).   Medical History: Past Medical History:  Diagnosis Date  . CVA (cerebral vascular accident) (HCC) 12/2018   And 03/2019, loop recorder inserted 03/2019  . Diabetes mellitus without complication (HCC)   . Hyperlipidemia   . Hypertension   . Obesity     Medications:  Infusions:  . heparin 700 Units/hr (08/02/19 1919)  . piperacillin-tazobactam (ZOSYN)  IV 3.375 g (08/02/19 2152)    Assessment: 64 yo female with PE seen on CT scan.  Pharmacy asked to begin IV heparin.  Baseline thrombocytopenia, Hgb WNL.  No anticoagulation noted PTA.  3/29 AM update:  Heparin level elevated No issues per RN  Goal of Therapy:  Heparin level 0.3-0.7 units/ml Monitor platelets by anticoagulation protocol: Yes   Plan:  Dec heparin to 600 units/hr 1200 heparin level  4/29, PharmD, BCPS Clinical Pharmacist Phone: [phone]

## 2019-08-03 NOTE — Progress Notes (Signed)
Palliative follow up  Visited with patient and daughter at beside.  RN in room working with patient.  Patient has a difficult time speaking and opening her mouth.    She allows me to look into her mouth and sticks out her tongue but is not able to open her mouth to show her oropharyngeal area.   White coating is on her tongue and gums.  She describes a painful burning sensation in her mouth and throat that keeps her from eating.    Ordered Nystatin swish and swallow QID (discussed swabbing if she is unable to swish and swallow) as well as diflucan IV 100 mg daily.  Communicated with Dr. Blake Divine.  PMT will touch base later in the week for symptom management.  Norvel Richards, PA-C Palliative Medicine Office:  [phone]  15 min Greater than 50%  of this time was spent counseling and coordinating care related to the above assessment and plan.

## 2019-08-04 DIAGNOSIS — I2699 Other pulmonary embolism without acute cor pulmonale: Secondary | ICD-10-CM | POA: Diagnosis present

## 2019-08-04 DIAGNOSIS — K529 Noninfective gastroenteritis and colitis, unspecified: Secondary | ICD-10-CM

## 2019-08-04 DIAGNOSIS — I82409 Acute embolism and thrombosis of unspecified deep veins of unspecified lower extremity: Secondary | ICD-10-CM

## 2019-08-04 DIAGNOSIS — B370 Candidal stomatitis: Secondary | ICD-10-CM

## 2019-08-04 LAB — GLUCOSE, CAPILLARY
Glucose-Capillary: 107 mg/dL — ABNORMAL HIGH (ref 70–99)
Glucose-Capillary: 97 mg/dL (ref 70–99)
Glucose-Capillary: 126 mg/dL — ABNORMAL HIGH (ref 70–99)
Glucose-Capillary: 111 mg/dL — ABNORMAL HIGH (ref 70–99)
Glucose-Capillary: 101 mg/dL — ABNORMAL HIGH (ref 70–99)

## 2019-08-04 LAB — CBC
HCT: 40.6 % (ref 36.0–46.0)
Hemoglobin: 13.8 g/dL (ref 12.0–15.0)
MCH: 30.1 pg (ref 26.0–34.0)
MCHC: 34 g/dL (ref 30.0–36.0)
MCV: 88.5 fL (ref 80.0–100.0)
Platelets: 100 10*3/uL — ABNORMAL LOW (ref 150–400)
RBC: 4.59 MIL/uL (ref 3.87–5.11)
RDW: 13.6 % (ref 11.5–15.5)
WBC: 4.9 10*3/uL (ref 4.0–10.5)
nRBC: 0 % (ref 0.0–0.2)

## 2019-08-04 LAB — HEPARIN LEVEL (UNFRACTIONATED): Heparin Unfractionated: 0.47 IU/mL (ref 0.30–0.70)

## 2019-08-04 MED ORDER — HYDRALAZINE HCL 25 MG PO TABS
25.0000 mg | ORAL_TABLET | Freq: Three times a day (TID) | ORAL | Status: DC
  Administered 2019-08-04 – 2019-08-27 (×69): 25 mg via ORAL
  Filled 2019-08-04 (×70): qty 1

## 2019-08-04 MED ORDER — AMOXICILLIN-POT CLAVULANATE 250-62.5 MG/5ML PO SUSR
500.0000 mg | Freq: Two times a day (BID) | ORAL | Status: AC
  Administered 2019-08-04 – 2019-08-08 (×10): 500 mg via ORAL
  Filled 2019-08-04 (×11): qty 10

## 2019-08-04 MED ORDER — APIXABAN 5 MG PO TABS
5.0000 mg | ORAL_TABLET | Freq: Two times a day (BID) | ORAL | Status: DC
  Administered 2019-08-11 – 2019-09-04 (×45): 5 mg via ORAL
  Filled 2019-08-04 (×46): qty 1

## 2019-08-04 MED ORDER — APIXABAN 5 MG PO TABS
10.0000 mg | ORAL_TABLET | Freq: Two times a day (BID) | ORAL | Status: AC
  Administered 2019-08-04 – 2019-08-11 (×14): 10 mg via ORAL
  Filled 2019-08-04 (×15): qty 2

## 2019-08-04 NOTE — Progress Notes (Signed)
The chaplain responded to a consult. The chaplain prayed for the patient. The patient was receptive of the prayer and thanked the chaplain for coming by. The chaplain is available if necessary.  Denise Santiago Chaplain Resident For questions concerning this note please contact me by pager [phone]

## 2019-08-04 NOTE — Progress Notes (Signed)
Calorie Count Note  48 hour calorie count ordered.  Diet: Dysphagia 1 with Thin liquids Supplements: Ensure Enlive QID, Magic Cup TID  No tickets available in calorie count envelope. Discussed with RN who states that she thinks nurses yesterday were not aware of calorie count. RD will extend calorie count another day to ensure accurate data collection.   Nutrition Dx: Moderate Malnutrition related to chronic illness(stroke) as evidenced by energy intake < 75% for > or equal to 1 month, percent weight loss, edema, mild fat depletion, mild muscle depletion.  Goal: Patient will meet greater than or equal to 90% of their needs  Intervention:  -Continue calorie count  -1:1 feeding assistance -Ensure Enlive po QID, each supplement provides 350 kcal and 20 grams of protein -Magic cup TID with meals, each supplement provides 290 kcal and 9 grams of protein  Eugene Gavia, MS, RD, LDN RD pager number and weekend/on-call pager number located in [place].

## 2019-08-04 NOTE — TOC Progression Note (Signed)
Transition of Care [facility]) - Progression Note    Patient Details  Name: Yeva Bissette MRN: [id number] Date of Birth: November 19, 1955  Transition of Care [facility]) CM/SW Contact  Baldemar Lenis, [place] Phone Number: 08/04/2019, 11:05 AM  Clinical Narrative:   CSW reached out to financial counseling with MD report that patient will most likely need long term placement and likely will not be returning back to living on her own and working after rehab, that she will need Medicaid and Disability applications completed to qualify for LOG. Per old Artist, they have been transitioned into new roles as of yesterday because the financial counseling office has been dissolved. CSW reached out to CSW Director and there have been no instructions on contacting the new financial counseling team at this time. CSW to follow.    Expected Discharge Plan: Skilled Nursing Facility Barriers to Discharge: Continued Medical Work up, Inadequate or no insurance, SNF Pending payor source - LOG, SNF Pending Medicaid, SNF Pending bed offer  Expected Discharge Plan and Services Expected Discharge Plan: Skilled Nursing Facility     Post Acute Care Choice: Skilled Nursing Facility Living arrangements for the past 2 months: Apartment                                       Social Determinants of Health (SDOH) Interventions    Readmission Risk Interventions No flowsheet data found.

## 2019-08-04 NOTE — Progress Notes (Addendum)
PROGRESS NOTE    Denise Santiago  GQB:[id number] DOB: [birth year] DOA: 07/26/2019 PCP: Seward Carol, MD    Brief Narrative:   64 year old woman with prior history of hypertension, diabetes, hyperlipidemia, CVA presents to ED for generalized weakness and fall.  On arrival to ED her labs were abnormal for elevated troponin, hypokalemia and hypomagnesemia.  Electrolytes were all repleted and therapy evaluations are recommending SNF at this time.  She continued to have generalized weakness , some dysphagia n the setting of multiple strokes. It was followed with an MRI brain,  showing No evidence of acute intracranial abnormality. Stable chronic ischemic changes including multifocal chronic cortically based infarcts within the right cerebral hemisphere and multiple chronic small vessel lacunar infarcts.  As per the sister patient has lost more than 50 pounds since November after her last stroke. On further questioning, pt reports that she has altered taste and no appetite whatso ever, along with loose teeth , preventing her to eat any solid food. Malignancy work up has been initiated and CT abd , pelvis and chest with contrast ordered for further evaluation.  CT of the chest with contrast showed acute bilateral pulmonary embolism and CT of the abdomen showed ascending colon colitis.  Venous duplex of the lower extremities show acute DVT involving a single left posterior tibial vein.  She was started on IV heparin for acute PE and IV Zosyn for colitis.  Meanwhile I ordered esophagogram was ordered for evaluation of dysphagia.  SLP evaluation done recommended regular diet with thin liquids. Patient was still refusing to eat solid food, unable to chew. SLP re consulted, her diet was changed to dysphagia 1 diet with thin liquids. Discussed with  Dentist on call, ordered Orthopantogram for further evaluation.      Assessment & Plan:   Principal Problem:   Major depressive disorder Active Problems:  Essential hypertension   Type 2 diabetes mellitus without complication (HCC)   CVA (cerebral vascular accident) ([name])   Hyperlipidemia   Generalized weakness   Hypokalemia   Hyponatremia   Acute on chronic kidney failure (HCC)   Thrombocytopenia (HCC)   Elevated troponin   Lactic acidosis   AKI (acute kidney injury) ([name])   Palliative care by specialist   Goals of care, counseling/discussion   Xerostomia   Dry lips   Failure to thrive in adult   Physical debility   Malnutrition of moderate degree   Esophageal thrush (HCC)   Palliative care encounter   Pulmonary embolism (HCC)   DVT (deep venous thrombosis) (HCC)   Thrush, oral   Colitis   Generalized weakness , adult failure to thrive Probably secondary to electrolyte abnormalities and general deconditioning due to multiple strokes in the past.  Initial CT of the head shows old right posterior parietal infarct without any acute intracranial abnormalities. MRI brain shows No evidence of acute intracranial abnormality. Stable chronic ischemic changes including multifocal chronic cortically based infarcts within the right cerebral hemisphere and multiple chronic small vessel lacunar infarcts. PT evaluation recommending CIR but as patient does not have enough support/caregiver assistance at home and family is inclined towards SNF as per inpatient rehab admission coordinator.  TOC consulted for SNF placement at this time.  Due to lack of insurance,  placement to SNF has been difficult    Adult failure to thrive Probably secondary to deconditioning and poor appetite and cognitive impairment. Psychiatry consulted for further evaluation of post stroke depression.  She was started on trazodone at night. Evaluation for malignancy with  tumor markers and imaging ordered.  Tumor markers have been negative so far.  CT chest with contrast  Shows Acute pulmonary emboli involve the right and left main pulmonary arteries as well as the lobar and  segmental branches supplying all the lobes of both lungs. No evidence of right heart strain.  CT abdomen showed ascending colon colitis.  No evidence of malignancy evident at this time.   Ascending colon colitis:  Currently on IV Zosyn for colitis.  Patient denies any nausea or vomiting and abdominal pain.  Transition to oral Augmentin to complete the course, she already received 3 days of rocephin , 3 days of IV zosyn and plan for 4 more days of Augmentin to complete a 10 day course.    Bilateral pulmonary embolism Patient was started on IV heparin plan to transition to oral Eliquis today.  Venous duplex of the lower extremities shows acute DVT involving the single left posterior tibial vein. Echocardiogram ordered showed left ventricular ejection fraction of 60 to 65% without any regional wall abnormalities.  Left ventricular diastolic parameters are consistent with grade 1 diastolic dysfunction.  No right heart strain seen.  AKI superimposed on stage II CKD Creatinine improving with hydration.  Baseline creatinine appears to be around 1.1.  Repeat creatinine at 1.1.   Essential hypertension Suboptimally controlled BP parameters, resume lisinopril and hydralazine 25 mg TID started.     Type 2 diabetes mellitus controlled.  Hemoglobin A1c is 6.9. CBG (last 3)  Recent Labs    08/04/19 0425 08/04/19 0808 08/04/19 1253  GLUCAP 107* 97 126*    CBGs are well controlled, continue with sliding scale insulin. No changes in medications    History of recent CVA Resume Plavix and statin.    Hyperlipidemia Resume statin.     Lactic acidosis In the setting of dehydration. Resolved   UTI:  Completed the course of IV Rocephin( 3 days ).      Chronic thrombocytopenia Platelets have been low in the past . No obvious signs of bleeding.   Constipation  Resolved  Poor oral hygiene, loose teeth in the lower part of the mouth, Oral thrush,  Missing dentures She was  started on IV diflucan and nystatin  Dental consult requested and orthopantogram ordered for further evaluation.    Moderate Malnutrition related to chronic illness: Dietary consulted and recommendations given.    DVT prophylaxis: Heparin decision to Eliquis Code Status: DNR Family Communication: None at bedside, called her sister and updated the plan Disposition Plan:  . Patient came from: Home            . Anticipated d/c place: SNF . Barriers to d/c OR conditions which need to be met to effect a safe d/c: TOC consult in place for SNF placement, difficult placement due to lack of insurance.   Consultants:   Dental consult  Palliative care   Procedures: Echocardiogram.    Antimicrobials: Anti-infectives (From admission, onward)   Start     Dose/Rate Route Frequency Ordered Stop   08/04/19 1015  amoxicillin-clavulanate (AUGMENTIN) 250-62.5 MG/5ML suspension 500 mg     500 mg Oral Every 12 hours 08/04/19 0844 08/09/19 0959   08/03/19 2000  fluconazole (DIFLUCAN) IVPB 100 mg     100 mg 50 mL/hr over 60 Minutes Intravenous Every 24 hours 08/03/19 1802     08/01/19 1715  piperacillin-tazobactam (ZOSYN) IVPB 3.375 g  Status:  Discontinued     3.375 g 12.5 mL/hr over 240 Minutes Intravenous Every 8  hours 08/01/19 1712 08/04/19 0844   07/30/19 0000  cefTRIAXone (ROCEPHIN) 1 g in sodium chloride 0.9 % 100 mL IVPB  Status:  Discontinued     1 g 200 mL/hr over 30 Minutes Intravenous Every 24 hours 07/29/19 1420 07/29/19 1421   07/29/19 1421  cefTRIAXone (ROCEPHIN) 1 g in sodium chloride 0.9 % 100 mL IVPB  Status:  Discontinued     1 g 200 mL/hr over 30 Minutes Intravenous Every 24 hours 07/29/19 1421 08/01/19 0716       Subjective: Patient did not want to eat solid food even today with assistance..  She denies any abdominal pain, nausea or vomiting or diarrhea.  No chest pain or shortness of breath or dizziness. Objective: Vitals:   08/03/19 2323 08/04/19 0332 08/04/19 0805  08/04/19 1250  BP: (!) 148/71 137/73 (!) 155/79 (!) 161/82  Pulse: 77 79 79 82  Resp: 19 19 17 15   Temp: 98.7 F (37.1 C) 98.2 F (36.8 C) 98 F (36.7 C) 98.5 F (36.9 C)  TempSrc: Oral Oral Oral Oral  SpO2: 98% 98% 100% 98%  Weight:      Height:        Intake/Output Summary (Last 24 hours) at 08/04/2019 1627 Last data filed at 08/03/2019 1815 Gross per 24 hour  Intake 20 ml  Output --  Net 20 ml   Filed Weights   07/31/19 0416 08/01/19 0500 08/01/19 1700  Weight: 81.1 kg 81.6 kg 81.6 kg    Examination:  General exam: Alert, does not appear to be in any kind of distress Respiratory system: Clear to auscultation bilaterally, no wheezing or rhonchi Cardiovascular system: S1-S2 heard, regular rate rhythm, no JVD, no pedal edema Gastrointestinal system: Abdomen is soft, nontender, nondistended bowel sounds normal Central nervous system:.  Alert answering questions and following commands Extremities: No pedal edema or cyanosis Skin: No rashes seen psychiatry: Flat affect  Data Reviewed: I have personally reviewed following labs and imaging studies  CBC: Recent Labs  Lab 07/29/19 0351 08/01/19 0545 08/02/19 0108 08/03/19 0322 08/04/19 0229  WBC 4.8 4.8 4.9 5.0 4.9  NEUTROABS 3.4  --   --   --   --   HGB 14.1 14.5 14.1 14.6 13.8  HCT 41.5 43.5 41.1 42.5 40.6  MCV 89.1 91.4 88.8 88.4 88.5  PLT 110* 101* 105* 101* 403*   Basic Metabolic Panel: Recent Labs  Lab 07/29/19 0351 07/30/19 0754 07/31/19 1847 08/01/19 0545 08/03/19 0322  NA 137 139 140 139 143  K 4.6 4.9 4.2 4.0 3.6  CL 107 109 108 108 109  CO2 20* 20* 22 20* 23  GLUCOSE 104* 104* 84 121* 105*  BUN 14 17 15 12 11   CREATININE 1.11* 1.18* 0.96 1.01* 1.18*  CALCIUM 8.9 8.9 9.0 8.7* 8.7*  MG  --  2.0 1.8  --  1.8  PHOS  --   --  1.9*  --  2.5   GFR: Estimated Creatinine Clearance: 47.7 mL/min (A) (by C-G formula based on SCr of 1.18 mg/dL (H)). Liver Function Tests: No results for input(s): AST,  ALT, ALKPHOS, BILITOT, PROT, ALBUMIN in the last 168 hours. No results for input(s): LIPASE, AMYLASE in the last 168 hours. No results for input(s): AMMONIA in the last 168 hours. Coagulation Profile: No results for input(s): INR, PROTIME in the last 168 hours. Cardiac Enzymes: No results for input(s): CKTOTAL, CKMB, CKMBINDEX, TROPONINI in the last 168 hours. BNP (last 3 results) No results for input(s): PROBNP in  the last 8760 hours. HbA1C: No results for input(s): HGBA1C in the last 72 hours. CBG: Recent Labs  Lab 08/03/19 1132 08/03/19 1557 08/04/19 0425 08/04/19 0808 08/04/19 1253  GLUCAP 119* 115* 107* 97 126*   Lipid Profile: No results for input(s): CHOL, HDL, LDLCALC, TRIG, CHOLHDL, LDLDIRECT in the last 72 hours. Thyroid Function Tests: No results for input(s): TSH, T4TOTAL, FREET4, T3FREE, THYROIDAB in the last 72 hours. Anemia Panel: No results for input(s): VITAMINB12, FOLATE, FERRITIN, TIBC, IRON, RETICCTPCT in the last 72 hours. Sepsis Labs: No results for input(s): PROCALCITON, LATICACIDVEN in the last 168 hours.  Recent Results (from the past 240 hour(s))  Culture, blood (Routine X 2) w Reflex to ID Panel     Status: None   Collection Time: 07/26/19 11:04 AM   Specimen: BLOOD LEFT HAND  Result Value Ref Range Status   Specimen Description BLOOD LEFT HAND  Final   Special Requests   Final    BOTTLES DRAWN AEROBIC AND ANAEROBIC Blood Culture adequate volume   Culture   Final    NO GROWTH 5 DAYS Performed at [facility], [address] [place], [place] [address]    Report Status 07/31/2019 FINAL  Final  SARS CORONAVIRUS 2 (TAT 6-24 HRS) Nasopharyngeal Nasopharyngeal Swab     Status: None   Collection Time: 07/26/19 12:44 PM   Specimen: Nasopharyngeal Swab  Result Value Ref Range Status   SARS Coronavirus 2 NEGATIVE NEGATIVE Final    Comment: (NOTE) SARS-CoV-2 target nucleic acids are NOT DETECTED. The SARS-CoV-2 RNA is generally detectable in  upper and lower respiratory specimens during the acute phase of infection. Negative results do not preclude SARS-CoV-2 infection, do not rule out co-infections with other pathogens, and should not be used as the sole basis for treatment or other patient management decisions. Negative results must be combined with clinical observations, patient history, and epidemiological information. The expected result is Negative. Fact Sheet for Patients: SugarRoll.be Fact Sheet for Healthcare Providers: https://www.woods-mathews.com/ This test is not yet approved or cleared by the [place] FDA and  has been authorized for detection and/or diagnosis of SARS-CoV-2 by FDA under an Emergency Use Authorization (EUA). This EUA will remain  in effect (meaning this test can be used) for the duration of the COVID-19 declaration under Section 56 4(b)(1) of the Act, 21 U.S.C. section 360bbb-3(b)(1), unless the authorization is terminated or revoked sooner. Performed at [facility], [place] [address] [place], [place] [address]   Culture, blood (Routine X 2) w Reflex to ID Panel     Status: None   Collection Time: 07/26/19  5:09 PM   Specimen: BLOOD LEFT HAND  Result Value Ref Range Status   Specimen Description BLOOD LEFT HAND  Final   Special Requests   Final    BOTTLES DRAWN AEROBIC ONLY Blood Culture results may not be optimal due to an inadequate volume of blood received in culture bottles   Culture   Final    NO GROWTH 5 DAYS Performed at [facility], [place] [address], [place], [place] [address]    Report Status 07/31/2019 FINAL  Final  Urine Culture     Status: Abnormal   Collection Time: 07/27/19  6:20 AM   Specimen: Urine, Random  Result Value Ref Range Status   Specimen Description URINE, RANDOM  Final   Special Requests   Final    NONE Performed at [facility], [place] [address], [place], [place]  [address]    Culture 80,000  COLONIES/mL ESCHERICHIA COLI (A)  Final   Report Status 07/29/2019 FINAL  Final   Organism ID, Bacteria ESCHERICHIA COLI (A)  Final      Susceptibility   Escherichia coli - MIC*    AMPICILLIN <=2 SENSITIVE Sensitive     CEFAZOLIN <=4 SENSITIVE Sensitive     CEFTRIAXONE <=0.25 SENSITIVE Sensitive     CIPROFLOXACIN <=0.25 SENSITIVE Sensitive     GENTAMICIN <=1 SENSITIVE Sensitive     IMIPENEM <=0.25 SENSITIVE Sensitive     NITROFURANTOIN <=16 SENSITIVE Sensitive     TRIMETH/SULFA <=20 SENSITIVE Sensitive     AMPICILLIN/SULBACTAM <=2 SENSITIVE Sensitive     * 80,000 COLONIES/mL ESCHERICHIA COLI         Radiology Studies: No results found.      Scheduled Meds: . amoxicillin-clavulanate  500 mg Oral Q12H  . antiseptic oral rinse  15 mL Mouth Rinse BID  . atorvastatin  80 mg Oral Daily  . clopidogrel  75 mg Oral Daily  . feeding supplement (ENSURE ENLIVE)  237 mL Oral QID  . hydrALAZINE  25 mg Oral Q8H  . insulin aspart  0-15 Units Subcutaneous TID WC  . insulin aspart  0-5 Units Subcutaneous QHS  . latanoprost  1 drop Both Eyes QHS  . lisinopril  20 mg Oral Daily  . nicotine  21 mg Transdermal Daily  . nystatin  5 mL Oral QID  . polyethylene glycol  17 g Oral BID  . senna  1 tablet Oral QHS  . sodium chloride flush  3 mL Intravenous Once  . traZODone  100 mg Oral QHS   Continuous Infusions: . fluconazole (DIFLUCAN) IV 100 mg (08/03/19 2141)  . heparin 600 Units/hr (08/03/19 0438)     LOS: 8 days        Hosie Poisson, MD [facility]   To contact the attending provider between 7A-7P or the covering provider during after hours 7P-7A, please log into the web site [email or web address] and access using universal [place] password for that web site. If you do not have the password, please call the hospital operator.  08/04/2019, 4:27 PM
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
## 2019-08-04 NOTE — Progress Notes (Signed)
ANTICOAGULATION CONSULT NOTE   Pharmacy Consult for  IV Heparin > Apixaban Indication: pulmonary embolus, DVT  Allergies  Allergen Reactions  . Sulfur     Pt doesn't remember reaction    Patient Measurements: Height: 5\' 2"  (157.5 cm) Weight: 179 lb 14.3 oz (81.6 kg) IBW/kg (Calculated) : 50.1 Heparin Dosing Weight: 68.3 kg  Vital Signs: Temp: 98.5 F (36.9 C) (03/30 1250) Temp Source: Oral (03/30 1250) BP: 161/82 (03/30 1250) Pulse Rate: 82 (03/30 1250)  Labs: Recent Labs    08/02/19 0108 08/02/19 0739 08/03/19 0322 08/03/19 1230 08/04/19 0229  HGB 14.1  --  14.6  --  13.8  HCT 41.1  --  42.5  --  40.6  PLT 105*  --  101*  --  100*  HEPARINUNFRC 0.89*   < > 0.79* 0.53 0.47  CREATININE  --   --  1.18*  --   --    < > = values in this interval not displayed.    Estimated Creatinine Clearance: 47.7 mL/min (A) (by C-G formula based on SCr of 1.18 mg/dL (H)).   Medical History: Past Medical History:  Diagnosis Date  . CVA (cerebral vascular accident) (HCC) 12/2018   And 03/2019, loop recorder inserted 03/2019  . Diabetes mellitus without complication (HCC)   . Hyperlipidemia   . Hypertension   . Obesity     Assessment: 64 yr old female with PE seen on CT scan.  Pharmacy was consulted 08/01/19 evening to begin IV heparin.  Baseline thrombocytopenia, Hgb WNL prior to start of IV heparin.  No anticoagulation noted PTA.  In addition to bilateral PE, venous duplex of the lower extremities on 08/02/19 showed acute DVT involving the single left posterior tibial vein.  Heparin level is 0.47 units/ml this AM; pt remains therapeutic on heparin 600 units/hr. HgbWNL/stable, platelet count 100 low/stable (was 88 on admit 07/26/19), chronic thrombocytopenia noted.  No bleeding reported.  Pharmacy has been consulted to transition pt from IV heparin to apixaban.   Goal of Therapy:  Heparin level 0.3-0.7 units/ml Monitor platelets by anticoagulation protocol: Yes   Plan:   Stop heparin infusion at 1800 this evening; at the same time, start apixaban 10 mg po BID X 7 days, followed by apixaban 5 mg PO BID Monitor daily CBC Monitor renal function Monitor for signs/symptoms of bleeding  07/28/19, PharmD, BCPS, [facility] Clinical Pharmacist 08/04/19, 16:45 PM

## 2019-08-04 NOTE — Progress Notes (Signed)
ANTICOAGULATION CONSULT NOTE   Pharmacy Consult for  IV heparin Indication: pulmonary embolus  Allergies  Allergen Reactions  . Sulfur     Pt doesn't remember reaction    Patient Measurements: Height: 5\' 2"  (157.5 cm) Weight: 179 lb 14.3 oz (81.6 kg) IBW/kg (Calculated) : 50.1 Heparin Dosing Weight: 68.3 kg  Vital Signs: Temp: 98 F (36.7 C) (03/30 0805) Temp Source: Oral (03/30 0805) BP: 155/79 (03/30 0805) Pulse Rate: 79 (03/30 0805)  Labs: Recent Labs    08/02/19 0108 08/02/19 0739 08/03/19 0322 08/03/19 1230 08/04/19 0229  HGB 14.1  --  14.6  --  13.8  HCT 41.1  --  42.5  --  40.6  PLT 105*  --  101*  --  100*  HEPARINUNFRC 0.89*   < > 0.79* 0.53 0.47  CREATININE  --   --  1.18*  --   --    < > = values in this interval not displayed.    Estimated Creatinine Clearance: 47.7 mL/min (A) (by C-G formula based on SCr of 1.18 mg/dL (H)).   Medical History: Past Medical History:  Diagnosis Date  . CVA (cerebral vascular accident) (HCC) 12/2018   And 03/2019, loop recorder inserted 03/2019  . Diabetes mellitus without complication (HCC)   . Hyperlipidemia   . Hypertension   . Obesity     Medications:  Infusions:  . fluconazole (DIFLUCAN) IV 100 mg (08/03/19 2141)  . heparin 600 Units/hr (08/03/19 08/05/19)    Assessment: 64 yo female with PE seen on CT scan.  Pharmacy consulted 08/01/19 to begin IV heparin.  Baseline thrombocytopenia, Hgb WNL prior to start of IV heparin.  No anticoagulation noted PTA.  Heparin level is 0.47 , remains therapeutic on heparin 600 units/hr.  Hgb wnl/stable, pltc 100 low/stable (was 88 on admit 07/26/19), chronic thrombocytopenia noted.  No bleeding reported.  Goal of Therapy:  Heparin level 0.3-0.7 units/ml Monitor platelets by anticoagulation protocol: Yes   Plan:  Continue heparin at 600 units/hr Daily heparin level and CBC  Thank you for allowing pharmacy to be part of this patients care team. 07/28/19,  RPh Clinical Pharmacist Please check AMION for all [facility] Pharmacy phone numbers After 10:00 PM, call Main Pharmacy [phone] 08/04/2019  10:59 AM

## 2019-08-05 ENCOUNTER — Inpatient Hospital Stay (HOSPITAL_COMMUNITY): Payer: Self-pay

## 2019-08-05 DIAGNOSIS — I639 Cerebral infarction, unspecified: Secondary | ICD-10-CM

## 2019-08-05 DIAGNOSIS — B370 Candidal stomatitis: Secondary | ICD-10-CM

## 2019-08-05 DIAGNOSIS — E440 Moderate protein-calorie malnutrition: Secondary | ICD-10-CM

## 2019-08-05 DIAGNOSIS — K529 Noninfective gastroenteritis and colitis, unspecified: Secondary | ICD-10-CM

## 2019-08-05 LAB — CBC
HCT: 40.1 % (ref 36.0–46.0)
Hemoglobin: 13.5 g/dL (ref 12.0–15.0)
MCH: 30.1 pg (ref 26.0–34.0)
MCHC: 33.7 g/dL (ref 30.0–36.0)
MCV: 89.3 fL (ref 80.0–100.0)
Platelets: 100 10*3/uL — ABNORMAL LOW (ref 150–400)
RBC: 4.49 MIL/uL (ref 3.87–5.11)
RDW: 13.7 % (ref 11.5–15.5)
WBC: 4.3 10*3/uL (ref 4.0–10.5)
nRBC: 0 % (ref 0.0–0.2)

## 2019-08-05 LAB — URINALYSIS, ROUTINE W REFLEX MICROSCOPIC
Glucose, UA: NEGATIVE mg/dL
Hgb urine dipstick: NEGATIVE
Ketones, ur: 5 mg/dL — AB
Leukocytes,Ua: NEGATIVE
Nitrite: NEGATIVE
Protein, ur: 100 mg/dL — AB
Specific Gravity, Urine: 1.034 — ABNORMAL HIGH (ref 1.005–1.030)
pH: 5 (ref 5.0–8.0)

## 2019-08-05 LAB — BASIC METABOLIC PANEL
Anion gap: 9 (ref 5–15)
BUN: 12 mg/dL (ref 8–23)
CO2: 21 mmol/L — ABNORMAL LOW (ref 22–32)
Calcium: 8.6 mg/dL — ABNORMAL LOW (ref 8.9–10.3)
Chloride: 109 mmol/L (ref 98–111)
Creatinine, Ser: 1.19 mg/dL — ABNORMAL HIGH (ref 0.44–1.00)
GFR calc Af Amer: 56 mL/min — ABNORMAL LOW (ref >60)
GFR calc non Af Amer: 48 mL/min — ABNORMAL LOW (ref >60)
Glucose, Bld: 129 mg/dL — ABNORMAL HIGH (ref 70–99)
Potassium: 3.1 mmol/L — ABNORMAL LOW (ref 3.5–5.1)
Sodium: 139 mmol/L (ref 135–145)

## 2019-08-05 LAB — GLUCOSE, CAPILLARY
Glucose-Capillary: 128 mg/dL — ABNORMAL HIGH (ref 70–99)
Glucose-Capillary: 110 mg/dL — ABNORMAL HIGH (ref 70–99)
Glucose-Capillary: 117 mg/dL — ABNORMAL HIGH (ref 70–99)
Glucose-Capillary: 133 mg/dL — ABNORMAL HIGH (ref 70–99)
Glucose-Capillary: 106 mg/dL — ABNORMAL HIGH (ref 70–99)

## 2019-08-05 MED ORDER — POTASSIUM CHLORIDE CRYS ER 20 MEQ PO TBCR
40.0000 meq | EXTENDED_RELEASE_TABLET | Freq: Once | ORAL | Status: AC
  Administered 2019-08-05: 40 meq via ORAL
  Filled 2019-08-05: qty 2

## 2019-08-05 MED ORDER — POTASSIUM CHLORIDE 20 MEQ PO PACK
40.0000 meq | PACK | Freq: Once | ORAL | Status: AC
  Administered 2019-08-05: 40 meq via ORAL
  Filled 2019-08-05: qty 2

## 2019-08-05 NOTE — Progress Notes (Signed)
Potassium 3.1, Kirby informed.

## 2019-08-05 NOTE — Progress Notes (Signed)
Calorie Count Note  48 hour calorie count ordered.  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  Goal: Patient will meet greater than or equal to 90% of their needs  Intervention:  -Continue calorie count due to inadequate documentation during the first 24 hours of the calorie count -1:1 feeding assistance -Ensure Enlive poQID, each supplement provides 350 kcal and 20 grams of protein -Magic cup TID with meals, each supplement provides 290 kcal and 9 grams of protein  Eugene Gavia, MS, RD, LDN RD pager number and weekend/on-call pager number located in [place].

## 2019-08-05 NOTE — Progress Notes (Signed)
Physical Therapy Treatment Patient Details Name: Denise Santiago MRN: [id number] DOB: Jun 04, 1955 Today's Date: 08/05/2019    History of Present Illness 64 y.o. female with a hx of DM, HTN, HLD, CVA 12/2018 & 03/2019, ongoing tob use who presented to ED with generalized weakness and elevated troponin, thought to be non specific by cardiology. pt reports weakness after COVID vaccine 07/16/19. The day prior to admission pt slid out of chair to floor and remained through the night.     PT Comments    Pt continues to rapidly decline in strength.  She is no longer able to grip and is unable to move into standing.  Informed MD of concerns and need for neuro consult.  Continue to recommend SNF placement.     Follow Up Recommendations  Supervision/Assistance - 24 hour;SNF     Equipment Recommendations  None recommended by PT    Recommendations for Other Services       Precautions / Restrictions Precautions Precautions: Fall Precaution Comments: reports falls since COVID vaccine Restrictions Weight Bearing Restrictions: No    Mobility  Bed Mobility Overal bed mobility: Needs Assistance Bed Mobility: Supine to Sit;Sit to Supine     Supine to sit: Max assist;+2 for physical assistance Sit to supine: Total assist;+2 for physical assistance   General bed mobility comments: Max top move to edge of bed, used bed pad to advance hips and external assistance to move LEs to edge of bed and to elevate trunk into a seated position.  Transfers Overall transfer level: Needs assistance Equipment used: Rolling walker (2 wheeled) Transfers: Sit to/from Stand           General transfer comment: Attempted to stand x3 reps and patient only able to initiate movement with +2 total, her hips never left the bed despite repeated attempts.  She lacked coordination to hold the stedy frame and move into standing.  Ambulation/Gait Ambulation/Gait assistance: (Unable)               Stairs              Wheelchair Mobility    Modified Rankin (Stroke Patients Only) Modified Rankin (Stroke Patients Only) Pre-Morbid Rankin Score: No significant disability Modified Rankin: Moderately severe disability     Balance Overall balance assessment: Needs assistance   Sitting balance-Leahy Scale: Zero       Standing balance-Leahy Scale: Poor Standing balance comment: unable to stand x3 attempts                            Cognition Arousal/Alertness: Awake/alert Behavior During Therapy: Flat affect Overall Cognitive Status: No family/caregiver present to determine baseline cognitive functioning Area of Impairment: Safety/judgement;Awareness;Following commands                     Memory: Decreased short-term memory Following Commands: Follows one step commands with increased time;Follows one step commands inconsistently Safety/Judgement: Decreased awareness of safety;Decreased awareness of deficits Awareness: Emergent Problem Solving: Slow processing;Difficulty sequencing;Decreased initiation General Comments: Pt continues to be confused reports, she walked to the bathroom across the hall last night.  Pt has had a stedy functional decline.      Exercises      General Comments        Pertinent Vitals/Pain Pain Assessment: No/denies pain Faces Pain Scale: No hurt Pain Location: L anterior thigh due to 3n1 pinch in commode Pain Descriptors / Indicators: Tender Pain Intervention(s): Monitored during session;Repositioned  Home Living                      Prior Function            PT Goals (current goals can now be found in the care plan section) Acute Rehab PT Goals Patient Stated Goal: none Potential to Achieve Goals: Good Progress towards PT goals: Progressing toward goals    Frequency    Min 3X/week      PT Plan Current plan remains appropriate    Co-evaluation              AM-PAC PT "6 Clicks" Mobility    Outcome Measure  Help needed turning from your back to your side while in a flat bed without using bedrails?: Total Help needed moving from lying on your back to sitting on the side of a flat bed without using bedrails?: Total Help needed moving to and from a bed to a chair (including a wheelchair)?: Total Help needed standing up from a chair using your arms (e.g., wheelchair or bedside chair)?: Total Help needed to walk in hospital room?: Total Help needed climbing 3-5 steps with a railing? : Total 6 Click Score: 6    End of Session Equipment Utilized During Treatment: Gait belt Activity Tolerance: Patient limited by fatigue;Patient limited by lethargy Patient left: with chair alarm set;in bed;with bed alarm set Nurse Communication: Mobility status PT Visit Diagnosis: Unsteadiness on feet (R26.81);Muscle weakness (generalized) (M62.81);Repeated falls (R29.6);Difficulty in walking, not elsewhere classified (R26.2)     Time: 3716-9678 PT Time Calculation (min) (ACUTE ONLY): 25 min  Charges:  $Therapeutic Activity: 23-37 mins                     Bonney Leitz , PTA Acute Rehabilitation Services Pager [phone] Office [phone]     Daina Cara Artis Delay 08/05/2019, 5:45 PM

## 2019-08-05 NOTE — Progress Notes (Signed)
Sister had a talk with her today about not eating and also not trying to do anything for herself.  She has to be fed each meal because of her lack of fine motor coordination in her hands and also because she does not want to eat and often refuses.  Dr did order MRI, as she has had a change, in that she is not able to assist to get out of bed or to do anything for herself.  PTA confirmed that she was walking last week and now she cannot even get out of the bed.

## 2019-08-05 NOTE — Plan of Care (Signed)
  Problem: Education: Goal: Knowledge of General Education information will improve Description: Including pain rating scale, medication(s)/side effects and non-pharmacologic comfort measures Outcome: Progressing   Problem: Clinical Measurements: Goal: Will remain free from infection Outcome: Progressing Goal: Diagnostic test results will improve Outcome: Progressing Goal: Respiratory complications will improve Outcome: Progressing Goal: Cardiovascular complication will be avoided Outcome: Progressing   Problem: Activity: Goal: Risk for activity intolerance will decrease Outcome: Progressing   Problem: Nutrition: Goal: Adequate nutrition will be maintained Outcome: Progressing   Problem: Coping: Goal: Level of anxiety will decrease Outcome: Progressing   Problem: Elimination: Goal: Will not experience complications related to bowel motility Outcome: Progressing Goal: Will not experience complications related to urinary retention Outcome: Progressing   Problem: Pain Managment: Goal: General experience of comfort will improve Outcome: Progressing   Problem: Safety: Goal: Ability to remain free from injury will improve Outcome: Progressing   Problem: Skin Integrity: Goal: Risk for impaired skin integrity will decrease Outcome: Progressing   

## 2019-08-05 NOTE — Progress Notes (Signed)
Transitions of Care Pharmacist Note  Olene Godfrey is a 64 y.o. female that has been diagnosed with PE and will be prescribed Eliquis (apixaban) at discharge.   Patient Education: I provided the following education on Eliquis to the patient: How to take the medication Described what the medication is Signs of bleeding Signs/symptoms of VTE and stroke  Answered their questions  Discharge Medications Plan: The patient is going to be discharged to SNF so medications will not be sent through the [facility] [place] pharmacy.   If the patient is not discharged to SNF and would like to have the Transitions of Care pharmacy fill their discharge medications, please call us at [phone].    Thank you,   Fara Olden, PharmD PGY-1 Pharmacy Resident   Please check amion for clinical pharmacist contact number   August 05, 2019

## 2019-08-05 NOTE — Progress Notes (Signed)
PROGRESS NOTE  Denise Santiago YCX:[id number] DOB: 1956-04-08 DOA: 07/26/2019 PCP: Seward Carol, MD  HPI/Recap of past 24 hours:  Poor oral intake, very weak She denies nausea vomiting, denies abdominal pain Calorie count underway  Assessment/Plan: Principal Problem:   Major depressive disorder Active Problems:   Essential hypertension   Type 2 diabetes mellitus without complication (HCC)   CVA (cerebral vascular accident) ([place])   Hyperlipidemia   Generalized weakness   Hypokalemia   Hyponatremia   Acute on chronic kidney failure (HCC)   Thrombocytopenia (HCC)   Elevated troponin   Lactic acidosis   AKI (acute kidney injury) ([place])   Palliative care by specialist   Goals of care, counseling/discussion   Xerostomia   Dry lips   Failure to thrive in adult   Physical debility   Malnutrition of moderate degree   Esophageal thrush (HCC)   Palliative care encounter   Pulmonary embolism ([place])   DVT (deep venous thrombosis) ([place])   Thrush, oral   Colitis    FTT/progressive weakness/poor oral intake with reported weight loss: Per sister patient started have strange behavior since Christmas, she started eat less But patient has been able to perform ADLs even driving to work on March 15 Per RN and physical therapist, patient is weaker today, will repeat MRI of the brain Consider neurology consult   Ascending colon colitis:  -Blood culture no growth Currently on IV Zosyn for colitis.  Patient denies any nausea or vomiting and abdominal pain.  Transition to oral Augmentin to complete the course, she already received 3 days of rocephin , 3 days of IV zosyn and plan for 4 more days of Augmentin to complete a 10 day course.   E. coli UTI -Fully treated   Hypokalemia, replace K, check mag  Bilateral pulmonary embolism Patient was started on IV heparin plan to transition to oral Eliquis  Venous duplex of the lower extremities shows acute DVT involving the single left  posterior tibial vein. Echocardiogram ordered showed left ventricular ejection fraction of 60 to 65% without any regional wall abnormalities.  Left ventricular diastolic parameters are consistent with grade 1 diastolic dysfunction.  No right heart strain seen.  Thrombocytopenia, acute on chronic -Platelet 88 on presentation, improving, today is 100  AKI superimposed on stage II CKD Creatinine 1.38 on presentation, today is 1.19 Repeat BMP in the morning Monitor renal function  History of recurrent CVA -MRI brain obtained March 25 no acute CVA -Continue Plavix and statin -She is on Eliquis now due to PE  Body mass index is 34.35 kg/m.   First Covid shot on March 13   DVT Prophylaxis: On Eliquis  Code Status: DNR  Family Communication: Sister over the phone with his permission Disposition Plan:    Patient came from:                            home                                                                              Anticipated d/c place: SNF  Barriers to d/c OR conditions which need to be met to effect a  safe d/c:  Difficult placement due to lack of insurance  Consultants:  Cardiology  Psychiatry  Dentistry  Palliative care  PT/OT/social worker  Dietitian  Procedures:  None  Antibiotics:  As described above   Objective: BP (P) 140/66 (BP Location: Right Arm)   Pulse (P) 92   Temp (P) 98 F (36.7 C) (Oral)   Resp (P) 18   Ht 5' 2"  (1.575 m)   Wt 85.2 kg   SpO2 (P) 99%   BMI 34.35 kg/m   Intake/Output Summary (Last 24 hours) at 08/05/2019 1552 Last data filed at 08/05/2019 0320 Gross per 24 hour  Intake 236.79 ml  Output --  Net 236.79 ml   Filed Weights   08/01/19 0500 08/01/19 1700 08/05/19 0424  Weight: 81.6 kg 81.6 kg 85.2 kg    Exam: Patient is examined daily including today on 08/05/2019, exams remain the same as of yesterday except that has changed    General:  NAD, aaox3  Cardiovascular: RRR  Respiratory:  CTABL  Abdomen: Soft/ND/NT, positive BS  Musculoskeletal: No Edema  Neuro: alert, oriented x3, bilateral upper extremity weakness, bilateral lower extremity weakness barely able to lift up against gravity  Data Reviewed: Basic Metabolic Panel: Recent Labs  Lab 07/30/19 0754 07/31/19 1847 08/01/19 0545 08/03/19 0322 08/05/19 0353  NA 139 140 139 143 139  K 4.9 4.2 4.0 3.6 3.1*  CL 109 108 108 109 109  CO2 20* 22 20* 23 21*  GLUCOSE 104* 84 121* 105* 129*  BUN 17 15 12 11 12   CREATININE 1.18* 0.96 1.01* 1.18* 1.19*  CALCIUM 8.9 9.0 8.7* 8.7* 8.6*  MG 2.0 1.8  --  1.8  --   PHOS  --  1.9*  --  2.5  --    Liver Function Tests: No results for input(s): AST, ALT, ALKPHOS, BILITOT, PROT, ALBUMIN in the last 168 hours. No results for input(s): LIPASE, AMYLASE in the last 168 hours. No results for input(s): AMMONIA in the last 168 hours. CBC: Recent Labs  Lab 08/01/19 0545 08/02/19 0108 08/03/19 0322 08/04/19 0229 08/05/19 0353  WBC 4.8 4.9 5.0 4.9 4.3  HGB 14.5 14.1 14.6 13.8 13.5  HCT 43.5 41.1 42.5 40.6 40.1  MCV 91.4 88.8 88.4 88.5 89.3  PLT 101* 105* 101* 100* 100*   Cardiac Enzymes:   No results for input(s): CKTOTAL, CKMB, CKMBINDEX, TROPONINI in the last 168 hours. BNP (last 3 results) Recent Labs    07/26/19 0810  BNP 64.2    ProBNP (last 3 results) No results for input(s): PROBNP in the last 8760 hours.  CBG: Recent Labs  Lab 08/04/19 1253 08/04/19 1649 08/04/19 2144 08/05/19 0617 08/05/19 1221  GLUCAP 126* 111* 101* 128* 110*    Recent Results (from the past 240 hour(s))  Culture, blood (Routine X 2) w Reflex to ID Panel     Status: None   Collection Time: 07/26/19  5:09 PM   Specimen: BLOOD LEFT HAND  Result Value Ref Range Status   Specimen Description BLOOD LEFT HAND  Final   Special Requests   Final    BOTTLES DRAWN AEROBIC ONLY Blood Culture results may not be optimal due to an inadequate volume of blood received in culture bottles    Culture   Final    NO GROWTH 5 DAYS Performed at [facility], [place] [address], [place], [place] [address]    Report Status 07/31/2019 FINAL  Final  Urine Culture     Status:  Abnormal   Collection Time: 07/27/19  6:20 AM   Specimen: Urine, Random  Result Value Ref Range Status   Specimen Description URINE, RANDOM  Final   Special Requests   Final    NONE Performed at [facility], [address], [place], [place] [address]    Culture 80,000 COLONIES/mL ESCHERICHIA COLI (A)  Final   Report Status 07/29/2019 FINAL  Final   Organism ID, Bacteria ESCHERICHIA COLI (A)  Final      Susceptibility   Escherichia coli - MIC*    AMPICILLIN <=2 SENSITIVE Sensitive     CEFAZOLIN <=4 SENSITIVE Sensitive     CEFTRIAXONE <=0.25 SENSITIVE Sensitive     CIPROFLOXACIN <=0.25 SENSITIVE Sensitive     GENTAMICIN <=1 SENSITIVE Sensitive     IMIPENEM <=0.25 SENSITIVE Sensitive     NITROFURANTOIN <=16 SENSITIVE Sensitive     TRIMETH/SULFA <=20 SENSITIVE Sensitive     AMPICILLIN/SULBACTAM <=2 SENSITIVE Sensitive     * 80,000 COLONIES/mL ESCHERICHIA COLI     Studies: No results found.  Scheduled Meds: . amoxicillin-clavulanate  500 mg Oral Q12H  . antiseptic oral rinse  15 mL Mouth Rinse BID  . apixaban  10 mg Oral BID   Followed by  . [START ON 08/11/2019] apixaban  5 mg Oral BID  . atorvastatin  80 mg Oral Daily  . clopidogrel  75 mg Oral Daily  . feeding supplement (ENSURE ENLIVE)  237 mL Oral QID  . hydrALAZINE  25 mg Oral Q8H  . insulin aspart  0-15 Units Subcutaneous TID WC  . insulin aspart  0-5 Units Subcutaneous QHS  . latanoprost  1 drop Both Eyes QHS  . lisinopril  20 mg Oral Daily  . nicotine  21 mg Transdermal Daily  . nystatin  5 mL Oral QID  . polyethylene glycol  17 g Oral BID  . senna  1 tablet Oral QHS  . sodium chloride flush  3 mL Intravenous Once  . traZODone  100 mg Oral QHS    Continuous Infusions: . fluconazole (DIFLUCAN) IV 100 mg (08/04/19 2016)      Time spent: 54mns I have personally reviewed and interpreted on  08/05/2019 daily labs, tele strips, imagings as discussed above under date review session and assessment and plans.  I reviewed all nursing notes, pharmacy notes, consultant notes,  vitals, pertinent old records  I have discussed plan of care as described above with RN , patient and family on 08/05/2019   FFlorencia ReasonsMD, PhD, FACP  [facility]  Available via Epic secure chat 7am-7pm for nonurgent issues Please page for urgent issues, pager number available through [email or web address] .   08/05/2019, 3:52 PM  LOS: 9 days

## 2019-08-05 NOTE — Progress Notes (Signed)
Daily Progress Note   Patient Name: Denise Santiago       Date: 08/05/19 DOB: 06-Oct-1955  Age: 64 y.o. MRN#: [id number] Attending Physician: Albertine Grates, MD Primary Care Physician: Renford Dills, MD Admit Date: 07/26/2019  Reason for Consultation/Follow-up: Establishing goals of care and Non pain symptom management  Subjective: Patient awake, alert, answers simple questions. Reports relief from medications for thrush. Denies pain or discomfort. Understands she needs SNF for rehab. Appetite poor. Encouraged oral intake and nutritional supplements.  Length of Stay: 10  Current Medications: Scheduled Meds:  . amoxicillin-clavulanate  500 mg Oral Q12H  . antiseptic oral rinse  15 mL Mouth Rinse BID  . apixaban  10 mg Oral BID   Followed by  . [START ON 08/11/2019] apixaban  5 mg Oral BID  . atorvastatin  80 mg Oral Daily  . clopidogrel  75 mg Oral Daily  . feeding supplement (ENSURE ENLIVE)  237 mL Oral QID  . hydrALAZINE  25 mg Oral Q8H  . insulin aspart  0-15 Units Subcutaneous TID WC  . insulin aspart  0-5 Units Subcutaneous QHS  . latanoprost  1 drop Both Eyes QHS  . lisinopril  20 mg Oral Daily  . nicotine  21 mg Transdermal Daily  . nystatin  5 mL Oral QID  . polyethylene glycol  17 g Oral BID  . senna  1 tablet Oral QHS  . sodium chloride flush  3 mL Intravenous Once  . traZODone  100 mg Oral QHS    Continuous Infusions: . fluconazole (DIFLUCAN) IV 100 mg (08/05/19 2024)    PRN Meds: acetaminophen **OR** acetaminophen, ondansetron **OR** ondansetron (ZOFRAN) IV, vitamin A & D  Physical Exam Vitals and nursing note reviewed.  HENT:     Mouth/Throat:     Mouth: Mucous membranes are dry.     Comments: No thrush visualized Pulmonary:     Effort: No tachypnea, accessory  muscle usage or respiratory distress.  Skin:    General: Skin is warm and dry.  Neurological:     Mental Status: She is alert and oriented to person, place, and time.            Vital Signs: BP 130/62 (BP Location: Right Arm)   Pulse 82   Temp 97.8 F (36.6 C) (Oral)   Resp 16  Ht 5\' 2"  (1.575 m)   Wt 85.3 kg   SpO2 100%   BMI 34.40 kg/m  SpO2: SpO2: 100 % O2 Device: O2 Device: Room Air O2 Flow Rate:    Intake/output summary:   Intake/Output Summary (Last 24 hours) at 08/06/2019 1049 Last data filed at 08/05/2019 2124 Gross per 24 hour  Intake 160 ml  Output 490 ml  Net -330 ml   LBM: Last BM Date: 08/04/19 Baseline Weight: Weight: 83.1 kg Most recent weight: Weight: 85.3 kg       Palliative Assessment/Data: PPS 40%      Patient Active Problem List   Diagnosis Date Noted  . Pulmonary embolism (HCC) 08/04/2019  . DVT (deep venous thrombosis) (HCC) 08/04/2019  . Thrush, oral 08/04/2019  . Colitis 08/04/2019  . Malnutrition of moderate degree 08/03/2019  . Esophageal thrush (HCC)   . Palliative care encounter   . Palliative care by specialist   . Goals of care, counseling/discussion   . Xerostomia   . Dry lips   . Failure to thrive in adult   . Physical debility   . Major depressive disorder 08/01/2019  . AKI (acute kidney injury) (HCC) 07/27/2019  . Hyperlipidemia   . Generalized weakness   . Hypokalemia   . Hyponatremia   . Acute on chronic kidney failure (HCC)   . Thrombocytopenia (HCC)   . Elevated troponin   . Lactic acidosis   . Essential hypertension 03/19/2019  . Type 2 diabetes mellitus without complication (HCC) 03/19/2019  . Left hemiparesis (HCC) 03/19/2019  . CVA (cerebral vascular accident) (HCC) 03/19/2019  . Right hemiparesis (HCC) 03/19/2019  . Acute ischemic right MCA stroke (HCC) 12/10/2018    Palliative Care Assessment & Plan   Patient Profile: 64 year old woman with prior history of hypertension, diabetes, hyperlipidemia,  CVA presents to ED for generalized weakness and fall. On arrival to ED her labs were abnormal for elevated troponin, hypokalemia and hypomagnesemia. Electrolytes were all repleted and therapy evaluations are recommending SNF at this time. She continued to have generalized weakness , some dysphagia n the setting of multiple strokes. It was followed with anMRI brain,showingno evidence of acute intracranial abnormality. Stable chronic ischemic changes including multifocal chroniccortically based infarcts within the right cerebral hemisphere and multiple chronic small vessel lacunar infarcts.   As per the sister patient has lost more than 50 pounds since November after her last stroke.On further questioning, pt reports that she has altered taste and no appetite whatso ever, along with loose teeth , preventing her to eat any solid food. Malignancy work up has been initiated and CT abd , pelvis and chest with contrast ordered forfurther evaluation. CT of the chest with contrast showed acute bilateral pulmonary embolism and CT of the abdomen showed ascending colon colitis. She was started on IV heparin for acute PE and IV Zosyn for colitis.Meanwhile I orderedesophagogram was ordered for evaluation of dysphagia. SLP evaluation done recommended regular diet with thin liquids.  Palliative care was asked to consult in the setting of poor PO intake and weight loss.   Assessment: Hx of CVA's Bilateral pulmonary embolism Ascending colin colitis AKI superimposed on stage II CKD Thrombocytopenia, acute on chronic E.coli UTI Failure to thrive Weakness  Recommendations/Plan:  MOST form completed this admission and in chart. DNR/DNI, treat the treatable.   Continue current plan of care and medical management  TOC team assisting with disposition. Needs SNF placement. Outpatient palliative referral recommended.  Continue nystatin and diflucan for thrush. Patient reports  improvement of  symptoms.  Code Status: DNR   Code Status Orders  (From admission, onward)         Start     Ordered   07/26/19 1509  Do not attempt resuscitation (DNR)  Continuous    Question Answer Comment  In the event of cardiac or respiratory ARREST Do not call a "code blue"   In the event of cardiac or respiratory ARREST Do not perform Intubation, CPR, defibrillation or ACLS   In the event of cardiac or respiratory ARREST Use medication by any route, position, wound care, and other measures to relive pain and suffering. May use oxygen, suction and manual treatment of airway obstruction as needed for comfort.      07/26/19 1510        Code Status History    Date Active Date Inactive Code Status Order ID Comments User Context   03/19/2019 0142 03/21/2019 1948 Full Code [id number]  Neena Rhymes, MD ED   12/10/2018 1015 12/11/2018 2120 Full Code [id number]  Epifanio Lesches, MD ED   Advance Care Planning Activity       Prognosis:   Unable to determine  Discharge Planning:  [place] for rehab with Palliative care service follow-up  Care plan was discussed with patient, RN  Thank you for allowing the Palliative Medicine Team to assist in the care of this patient.   Time In: 1000 Time Out: 1015 Total Time 15 Prolonged Time Billed no      Greater than 50%  of this time was spent counseling and coordinating care related to the above assessment and plan.  Ihor Dow, DNP, FNP-C Palliative Medicine Team  Phone: [phone] Fax: [phone]  Please contact Palliative Medicine Team phone at [phone] for questions and concerns.

## 2019-08-05 NOTE — Consult Note (Signed)
Dental Consult, at request of Dr. Blake Divine. Patient refuses food due to posterior mouth pain. No maxillary teeth present. Mandibular dentition present from #20-30. Significant mobility of remaining teeth due to severe periodontal disease and bone loss. No abscesses present or gross decay. Some evidence of candida deposits on mandibular alveolar ridge distal #20 and on tongue. 3x3 mm black macule present on lower right lip, facial #27 on labial mucosa. When questioned about oral pain, pt says that teeth don't hurt and agreed that pain is more posterior. Nystatin was prescribed after dental consult ordered, and pt says pain is somewhat better. Oral pain likely due to fungus. Recommend no dental treatment until patient's condition improves. Followup with a dentist for evaluation of lower teeth and macule is warranted if patient is able to better tolerate dental treatment at another time. Recommeded that patient continue to receive nutrition from ensure shakes and other liquids.

## 2019-08-05 NOTE — Discharge Instructions (Signed)
Information on my medicine - ELIQUIS (apixaban)  This medication education was reviewed with me or my healthcare representative as part of my discharge preparation.    Why was Eliquis prescribed for you? Eliquis was prescribed to treat blood clots that may have been found in the veins of your legs (deep vein thrombosis) or in your lungs (pulmonary embolism) and to reduce the risk of them occurring again.  What do You need to know about Eliquis ? The starting dose is 10 mg (two 5 mg tablets) taken TWICE daily for the FIRST SEVEN (7) DAYS, then on  4/6/202 at evening dose time the dose is reduced to ONE 5 mg tablet taken TWICE daily.  Eliquis may be taken with or without food.   Try to take the dose about the same time in the morning and in the evening. If you have difficulty swallowing the tablet whole please discuss with your pharmacist how to take the medication safely.  Take Eliquis exactly as prescribed and DO NOT stop taking Eliquis without talking to the doctor who prescribed the medication.  Stopping may increase your risk of developing a new blood clot.  Refill your prescription before you run out.  After discharge, you should have regular check-up appointments with your healthcare provider that is prescribing your Eliquis.    What do you do if you miss a dose? If a dose of ELIQUIS is not taken at the scheduled time, take it as soon as possible on the same day and twice-daily administration should be resumed. The dose should not be doubled to make up for a missed dose.  Important Safety Information A possible side effect of Eliquis is bleeding. You should call your healthcare provider right away if you experience any of the following: ? Bleeding from an injury or your nose that does not stop. ? Unusual colored urine (red or dark brown) or unusual colored stools (red or black). ? Unusual bruising for unknown reasons. ? A serious fall or if you hit your head (even if there is  no bleeding).  Some medicines may interact with Eliquis and might increase your risk of bleeding or clotting while on Eliquis. To help avoid this, consult your healthcare provider or pharmacist prior to using any new prescription or non-prescription medications, including herbals, vitamins, non-steroidal anti-inflammatory drugs (NSAIDs) and supplements.  This website has more information on Eliquis (apixaban): http://[email or web address].com/eliquis/home

## 2019-08-06 DIAGNOSIS — G629 Polyneuropathy, unspecified: Secondary | ICD-10-CM

## 2019-08-06 LAB — MAGNESIUM: Magnesium: 2 mg/dL (ref 1.7–2.4)

## 2019-08-06 LAB — HEPATIC FUNCTION PANEL
ALT: 52 U/L — ABNORMAL HIGH (ref 0–44)
AST: 72 U/L — ABNORMAL HIGH (ref 15–41)
Albumin: 2.5 g/dL — ABNORMAL LOW (ref 3.5–5.0)
Alkaline Phosphatase: 54 U/L (ref 38–126)
Bilirubin, Direct: 0.4 mg/dL — ABNORMAL HIGH (ref 0.0–0.2)
Indirect Bilirubin: 0.7 mg/dL (ref 0.3–0.9)
Total Bilirubin: 1.1 mg/dL (ref 0.3–1.2)
Total Protein: 5.1 g/dL — ABNORMAL LOW (ref 6.5–8.1)

## 2019-08-06 LAB — BASIC METABOLIC PANEL
Anion gap: 9 (ref 5–15)
BUN: 14 mg/dL (ref 8–23)
CO2: 23 mmol/L (ref 22–32)
Calcium: 8.9 mg/dL (ref 8.9–10.3)
Chloride: 110 mmol/L (ref 98–111)
Creatinine, Ser: 1.09 mg/dL — ABNORMAL HIGH (ref 0.44–1.00)
GFR calc Af Amer: >60 mL/min (ref >60)
GFR calc non Af Amer: 54 mL/min — ABNORMAL LOW (ref >60)
Glucose, Bld: 126 mg/dL — ABNORMAL HIGH (ref 70–99)
Potassium: 4.7 mmol/L (ref 3.5–5.1)
Sodium: 142 mmol/L (ref 135–145)

## 2019-08-06 LAB — TSH: TSH: 6.01 u[IU]/mL — ABNORMAL HIGH (ref 0.350–4.500)

## 2019-08-06 LAB — GLUCOSE, CAPILLARY
Glucose-Capillary: 121 mg/dL — ABNORMAL HIGH (ref 70–99)
Glucose-Capillary: 108 mg/dL — ABNORMAL HIGH (ref 70–99)
Glucose-Capillary: 85 mg/dL (ref 70–99)
Glucose-Capillary: 88 mg/dL (ref 70–99)

## 2019-08-06 LAB — CK: Total CK: 308 U/L — ABNORMAL HIGH (ref 38–234)

## 2019-08-06 MED ORDER — BISACODYL 10 MG RE SUPP: 10.0000 mg | Freq: Every day | RECTAL | Status: AC

## 2019-08-06 MED ORDER — SODIUM CHLORIDE 0.9 % IV SOLN: INTRAVENOUS | Status: AC

## 2019-08-06 NOTE — Progress Notes (Signed)
Calorie Count Note  48 hour calorie count ordered.  Diet: Dysphagia 1/Thins Supplements: Ensure Enlive QID, Magic Cup  08/05/19 Breakfast: 120 kcal, 6 grams protein Lunch: 195 kcal, 13.5 grams protein Dinner: 97.5 kcal, 5 grams protein Supplements: 1 Ensure Enlive documented, providing 350 kcal, 20 grams protein  Total intake: 762 kcal (43.5% of minimum estimated needs)  44.5 grams protein (40.5% of minimum estimated needs)  Nutrition Dx: Moderate Malnutritionrelated to chronic illness(stroke)as evidenced by energy intake < 75% for > or equal to 1 month, percent weight loss, edema, mild fat depletion, mild muscle depletion.  Goal: Patient will meet greater than or equal to 90% of their needs  Intervention:  -1:1 feeding assistance -Ensure Enlive poQID, each supplement provides 350 kcal and 20 grams of protein -Magic cup TID with meals, each supplement provides 290 kcal and 9 grams of protein   Eugene Gavia, MS, RD, LDN RD pager number and weekend/on-call pager number located in [place].

## 2019-08-06 NOTE — Consult Note (Signed)
NEURO HOSPITALIST CONSULT NOTE   Requestig physician: Dr. Roda Shutters  Reason for Consult: Diffuse weakness with ataxia  History obtained from:   Patient and Chart     HPI:                                                                                                                                          Denise Santiago is an 64 y.o. female with a PMHx of HTN, strokes in August and November of 2020, s/p loop recorder placement and DM, who was admitted on 3/21 for evaluation of generalized weakness. She had received the Covid vaccine the past Saturday prior to admission (March 13) and since then had been feeling weak and lethargic with no energy and decreased appetite. The day prior to admission she slid out of her chair onto the floor and was unable to get up due to generalized weakness - she stayed on the floor all night long and weakness was such that even with the help of her niece she was unable to get up. EMS was called on the morning of admission and she was brought to the ED for further assessment. Initial assessment revealed that she lives alone, uses a walker or cane for ambulation and smokes 1 ppd of cigarettes. She denied SOB, vision changes, slurred speech, fever, cough, wheezing and urinary symptoms on initial ED evaluation. VSS in the ED. Labs revealed hypokalemia and elevated troponin. CBC was unremarkable. TSH was somewhat elevated at 6.0. Of note, she was taking Lipitor prior to admission.   During this admission she was diagnosed with colitis of the ascending colon. AST/ALT were normal on admission but increased slightly to 72 and 52 during her hospital stay. BUN/Cr were suggestive of volume depletion at the time of admission, but have improved. CK on admission was 429, 2x greater than the upper limit of normal and has decreased to 308 as of today (4/1). MRI on 3/31 showed no acute abnormality, with chronic multifocal infarcts noted in the right cerebral hemisphere, in  conjunction with chronic small vessel disease and mild diffuse atrophy.   She was diagnosed with bilateral PE this admission and started on IV heparin; acute DVT of the left posterior tibial vein was diagnosed with ultrasound. Acute on chronic thrombocytopenia is also on her problem list; platelets were 88 on admission and have improved to 100.  She has had some problem initiating bowel movements, but not urination. Last BM was on about the 28th, per patient. She has had no incontinence. She does not endorse numbness.   She is currently on Plavix and Lipitor for her strokes, as well as Eliquis for her PE.   MRI brain 3/31: -- No evidence of acute intracranial abnormality, including acute infarction. -- Redemonstrated multifocal chronic cortical/subcortical infarcts  within the right cerebral hemisphere. -- Stable chronic small vessel ischemic disease with multiple chronic lacunar infarcts. -- Mild generalized parenchymal atrophy, unchanged.  Past Medical History:  Diagnosis Date  . CVA (cerebral vascular accident) (HCC) 12/2018   And 03/2019, loop recorder inserted 03/2019  . Diabetes mellitus without complication (HCC)   . Hyperlipidemia   . Hypertension   . Obesity     Past Surgical History:  Procedure Laterality Date  . ABDOMINAL HYSTERECTOMY    . BUBBLE STUDY  03/20/2019   Procedure: BUBBLE STUDY;  Surgeon: Wendall Stade, MD;  Location: [facility] ENDOSCOPY;  Service: Cardiovascular;;  . LOOP RECORDER INSERTION N/A 03/20/2019   Procedure: LOOP RECORDER INSERTION;  Surgeon: Regan Lemming, MD;  Location: [facility] INVASIVE CV LAB;  Service: Cardiovascular;  Laterality: N/A;  . TEE WITHOUT CARDIOVERSION N/A 03/20/2019   Procedure: TRANSESOPHAGEAL ECHOCARDIOGRAM (TEE);  Surgeon: Wendall Stade, MD;  Location: [facility] ENDOSCOPY;  Service: Cardiovascular;  Laterality: N/A;    No family history on file.            Social History:  reports that she has been smoking cigarettes. She has been  smoking about 1.00 pack per day. She has never used smokeless tobacco. She reports current alcohol use. She reports that she does not use drugs.  Allergies  Allergen Reactions  . Sulfur     Pt doesn't remember reaction    MEDICATIONS:                                                                                                                     Prior to Admission:  Medications Prior to Admission  Medication Sig Dispense Refill Last Dose  . acetaminophen (TYLENOL) 325 MG tablet Take 2 tablets (650 mg total) by mouth every 6 (six) hours as needed for mild pain (or Fever >/= 101). (Patient taking differently: Take 325 mg by mouth every 6 (six) hours as needed for mild pain or moderate pain (or Fever >/= 101). ) 12 tablet 0 07/22/2019  . atorvastatin (LIPITOR) 80 MG tablet Take 1 tablet (80 mg total) by mouth daily. 30 tablet 11 07/22/2019  . citalopram (CELEXA) 10 MG tablet Take 1 tablet (10 mg total) by mouth daily. 30 tablet 0 07/22/2019  . clopidogrel (PLAVIX) 75 MG tablet Take 75 mg by mouth daily.    07/15/2019 at Unknown time  . hydrochlorothiazide (HYDRODIURIL) 25 MG tablet Take 25 mg by mouth daily.   07/22/2019  . latanoprost (XALATAN) 0.005 % ophthalmic solution Place 1 drop into both eyes at bedtime. 2.5 mL 12 07/22/2019  . lisinopril (ZESTRIL) 40 MG tablet Take 40 mg by mouth daily.   07/22/2019  . metFORMIN (GLUCOPHAGE) 850 MG tablet Take 1 tablet (850 mg total) by mouth 2 (two) times daily with a meal. 60 tablet 0 Past Month at Unknown time  . Investigational - Study Medication Study name: -See attached Additional study details: -See attached BMS Axiomatic  stroke prevention trial (Patient not taking: Reported on  07/26/2019) 1 each PRN Not Taking at Unknown time  . Investigational - Study Medication Study name: BMS Axiomatic  stroke prevention trial Additional study details:  BMS Axiomatic  stroke prevention trial (Patient not taking: Reported on 07/26/2019) 1 each PRN Not Taking at  Unknown time  . Investigational - Study Medication Study name: BMS Axiomatic  stroke prevention trial Additional study details: (Patient not taking: Reported on 07/26/2019) 1 each PRN Not Taking at Unknown time  . lisinopril-hydrochlorothiazide (ZESTORETIC) 20-12.5 MG tablet Take 1 tablet by mouth daily. Start Monday 03/23/2019 (Patient not taking: Reported on 07/26/2019) 30 tablet 5 Not Taking at Unknown time   Scheduled: . amoxicillin-clavulanate  500 mg Oral Q12H  . antiseptic oral rinse  15 mL Mouth Rinse BID  . apixaban  10 mg Oral BID   Followed by  . [START ON 08/11/2019] apixaban  5 mg Oral BID  . atorvastatin  80 mg Oral Daily  . bisacodyl  10 mg Rectal Daily  . clopidogrel  75 mg Oral Daily  . feeding supplement (ENSURE ENLIVE)  237 mL Oral QID  . hydrALAZINE  25 mg Oral Q8H  . insulin aspart  0-15 Units Subcutaneous TID WC  . insulin aspart  0-5 Units Subcutaneous QHS  . latanoprost  1 drop Both Eyes QHS  . lisinopril  20 mg Oral Daily  . nicotine  21 mg Transdermal Daily  . nystatin  5 mL Oral QID  . polyethylene glycol  17 g Oral BID  . senna  1 tablet Oral QHS  . sodium chloride flush  3 mL Intravenous Once  . traZODone  100 mg Oral QHS   Continuous: . sodium chloride 100 mL/hr at 08/06/19 1344  . fluconazole (DIFLUCAN) IV 100 mg (08/05/19 2024)     ROS:                                                                                                                                       As per HPI. The patient denies any additional symptoms.   Blood pressure 132/64, pulse 89, temperature 97.7 F (36.5 C), temperature source Oral, resp. rate 18, height 5\' 2"  (1.575 m), weight 85.3 kg, SpO2 94 %.   General Examination:                                                                                                       Physical Exam  HEENT-  [place]/AT. Dried mucus on lips is prominently noted. Decreased  hydration of oral mucosa.  Lungs- Respirations unlabored   Extremities- No edema  Neurological Examination Mental Status: Awake and alert. Fully oriented. Speech is fluent with intact comprehension and naming. No dysarthria. Slightly nasal quality to her speech. Cranial Nerves: II: Visual fields intact with no extinction to DSS. Pupils are equal.   III,IV, VI: No ptosis. EOMI without nystagmus.   V,VII: No facial droop. Smile is symmetric and normal in the context of poor dentition and dried mucus. Eyelild closure is moderately weak. Facial temp sensation is equal bilaterally.  VIII: Hearing intact to voice.  IX,X: Mildly nasal quality to speech.  XI: Symmetric  XII: midline tongue extension but does not protrude completely outside of mouth.  Motor: BUE: Deltoids 3/5 on right, 2-3/5 on left. Biceps and triceps 4-/5 bilaterally, grip 4-/5 bilaterally. Cannot maintain LUE elevated antigravity at shoulder level for > 5 seconds.  BLE 2-3/5 hip flexion, 3/5 knee extension. Poor effort with ankle dorsi/plantar flexion.  Sensory: Temp and light touch intact throughout, bilaterally Deep Tendon Reflexes: 1+ right brachioradialis. Trace right biceps, left biceps and left brachioradialis. 0 patellae and achilles bilaterally.  Plantars: Right: downgoing   Left: downgoing Cerebellar: Dysmetric and dyssinergic FNF bilaterally with slight asymmetry. Unable to test H-S due to weakness.  Gait: Unable to assess.    Lab Results: Basic Metabolic Panel: Recent Labs  Lab 07/31/19 1847 07/31/19 1847 08/01/19 0545 08/01/19 0545 08/03/19 0322 08/05/19 0353 08/06/19 0533  NA 140  --  139  --  143 139 142  K 4.2  --  4.0  --  3.6 3.1* 4.7  CL 108  --  108  --  109 109 110  CO2 22  --  20*  --  23 21* 23  GLUCOSE 84  --  121*  --  105* 129* 126*  BUN 15  --  12  --  11 12 14   CREATININE 0.96  --  1.01*  --  1.18* 1.19* 1.09*  CALCIUM 9.0   < > 8.7*   < > 8.7* 8.6* 8.9  MG 1.8  --   --   --  1.8  --  2.0  PHOS 1.9*  --   --   --  2.5  --   --    < > = values  in this interval not displayed.    CBC: Recent Labs  Lab 08/01/19 0545 08/02/19 0108 08/03/19 0322 08/04/19 0229 08/05/19 0353  WBC 4.8 4.9 5.0 4.9 4.3  HGB 14.5 14.1 14.6 13.8 13.5  HCT 43.5 41.1 42.5 40.6 40.1  MCV 91.4 88.8 88.4 88.5 89.3  PLT 101* 105* 101* 100* 100*    Cardiac Enzymes: Recent Labs  Lab 08/06/19 0533  CKTOTAL 308*    Lipid Panel: No results for input(s): CHOL, TRIG, HDL, CHOLHDL, VLDL, LDLCALC in the last 168 hours.  Imaging: MR BRAIN WO CONTRAST  Result Date: 08/05/2019 CLINICAL DATA:  Stroke suspected. EXAM: MRI HEAD WITHOUT CONTRAST TECHNIQUE: Multiplanar, multiecho pulse sequences of the brain and surrounding structures were obtained without intravenous contrast. COMPARISON:  Brain MRI 07/30/2019 FINDINGS: Brain: There is no evidence of acute infarct. No evidence of intracranial mass. No midline shift or extra-axial fluid collection. Redemonstrated chronic cortical/subcortical infarcts in the posterior right cerebral hemisphere involving portions of the right temporal, occipital and parietal lobes. Redemonstrated chronic cortical/subcortical infarcts within the right frontal lobe. Unchanged chronic lacunar infarcts within the right corona radiata and bilateral basal ganglia. Stable background mild chronic small vessel  ischemic disease. As before, there is chronic hemosiderin deposition associated with the right cerebral infarcts and within the bilateral basal ganglia. Stable, mild generalized parenchymal atrophy. Vascular: Flow voids maintained within the proximal large arterial vessels. Skull and upper cervical spine: No focal marrow lesion. Sinuses/Orbits: Visualized orbits demonstrate no acute abnormality. Minimal ethmoid sinus mucosal thickening. Trace fluid within bilateral mastoid air cells. IMPRESSION: No evidence of acute intracranial abnormality, including acute infarction. Redemonstrated multifocal chronic cortical/subcortical infarcts within the  right cerebral hemisphere. Stable chronic small vessel ischemic disease with multiple chronic lacunar infarcts. Mild generalized parenchymal atrophy, unchanged. Electronically Signed   By: Jackey LogeKyle  Golden DO   On: 08/05/2019 22:44    Assessment: 64 year old female with a 3 week history of progressive bilateral upper and lower extremity weakness.  1. Exam reveals hypoactive reflexes, 3-4/5 bilateral upper and 3/5 bilateral lower extremity weakness without clear asymmetry or proximal-distal gradient. BUE ataxia is present, slightly worse on the right - unable to test for lower extremity ataxia due to weakness. No oculomotor defect, but there is some weakness to eyelid closure. Uncertain if there is weakness of muscles of mastication or pharyngeal weakness - there is no drooling or gurgling respirations, but speech has somewhat of a nasal quality. No lingual dysarthria, but patient was unable to fully protrude her tongue . 2. 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 Statin myopathy is also possible but less likely as the CK level was not markedly elevated.  3. 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  4. Chronic right cerebral hemisphere ischemic infarctions. The locations of the infarction, although relatively large in size, do not appear to intersect the motor tracts significantly as they predominantly involve the temporal and inferior parietal regions. However, the old stroke may explain the asymmetric left worse than right upper extremity weakness seen on exam.   Recommendations: 1. Obtain MRI of cervical, thoracic and lumbar spine with and without contrast.  2. An LP would be optimal to assess for  possible albuminocytologic dissociation (a typical finding in GBS). Unfortunately, give her PE, holding anticoagulation for an LP would carry a greater risk of morbidity/mortality than the overall benefit associated with the diagnostic information obtained from a CSF sample.    3. Therefore, if MRI of cervical, thoracic and lumbar spine is unremarkable, would initiate a 5 day course of empiric IVIG at 400 mg/kg per day  4. Discontinue Lipitor. 5. Continue Plavix for stroke prevention.  6. Continue Eliquis for DVT and PE treatment.    Electronically signed: Dr. Caryl PinaEric Lakeeta Dobosz 08/06/2019, 2:37 PM

## 2019-08-06 NOTE — Progress Notes (Signed)
  Speech Language Pathology Treatment: Dysphagia  Patient Details Name: Denise Santiago MRN: [id number] DOB: 1956/05/05 Today's Date: 08/06/2019 Time: 0175-1025 SLP Time Calculation (min) (ACUTE ONLY): 10 min  Assessment / Plan / Recommendation Clinical Impression  Pt was seen for dysphagia treatment. Nursing reported that the pt's p.o. has been poor but that she has been tolerating pills without difficulty. Pt tolerated puree solids and thin liquids without overt s/sx of aspiration. Mod cues were intermittently needed to swallow puree solids due to oral holding. Trials of dysphagia 2 solids were administered but pt required max cues to initiate mastication and mastication was minimal. It is also noteworthy that pt swallowed whole pieces of chopped peaches. A diet upgrade is not clinically indicated at this time. SLP will continue to follow pt.    HPI HPI: 64 year old woman with prior history of hypertension, diabetes, hyperlipidemia, CVA presents to ED for generalized weakness and fall.  Probably secondary to electrolyte abnormalities per MD. MRI shows chronic cortically based infarct within the posterior right cerebral hemisphere predominantly affecting the right temporal and occipital lobes and with minimal involvement of the adjacent right parietal lobe. Redemonstrated chronic cortically based infarct within right frontal operculum. Stable background chronic small vessel ischemic disease with chronic lacunar infarcts in the right corona radiata and bilateral basal ganglia.      SLP Plan  Continue with current plan of care       Recommendations  Diet recommendations: Dysphagia 1 (puree);Thin liquid Liquids provided via: Cup;Straw Medication Administration: Crushed with puree Supervision: Full supervision/cueing for compensatory strategies;Trained caregiver to feed patient Compensations: Slow rate;Small sips/bites;Follow solids with liquid Postural Changes and/or Swallow Maneuvers: Seated  upright 90 degrees                Oral Care Recommendations: Oral care BID Follow up Recommendations: 24 hour supervision/assistance SLP Visit Diagnosis: Dysphagia, unspecified (R13.10) Plan: Continue with current plan of care       Denise Santiago I. Denise Clock, Denise Santiago, Denise Santiago Acute Rehabilitation Services Office number [phone] Pager [phone]                Denise Santiago 08/06/2019, 5:05 PM

## 2019-08-06 NOTE — Progress Notes (Addendum)
PROGRESS NOTE  Denise Santiago HER:[id number] DOB: [birth year] DOA: 07/26/2019 PCP: Seward Carol, MD  HPI/Recap of past 24 hours:  Poor oral intake, very weak She denies nausea vomiting,  She reports lower abdominal pain today, she thinks she is constipated Calorie count underway  Assessment/Plan: Principal Problem:   Major depressive disorder Active Problems:   Essential hypertension   Type 2 diabetes mellitus without complication ([place])   CVA (cerebral vascular accident) ([place])   Hyperlipidemia   Generalized weakness   Hypokalemia   Hyponatremia   Acute on chronic kidney failure (HCC)   Thrombocytopenia (HCC)   Elevated troponin   Lactic acidosis   AKI (acute kidney injury) ([place])   Palliative care by specialist   Goals of care, counseling/discussion   Xerostomia   Dry lips   Failure to thrive in adult   Physical debility   Malnutrition of moderate degree   Esophageal thrush ([place])   Palliative care encounter   Pulmonary embolism ([place])   DVT (deep venous thrombosis) ([place])   Thrush, oral   Colitis    FTT/progressive weakness/poor oral intake with reported weight loss: Per sister patient started have strange behavior since Christmas, she started eat less But patient has been able to perform ADLs even driving to work on March 15 Per RN and physical therapist, patient is weaker today,   repeat MRI of the brain no acute findings, CK mildly elevated to 300 range, (she has been on Lipitor Alice since August 8563, now on increased dose) -May need to stop or decrease statin Case discussed with neurology Dr. Cheral Marker who will do formal neurology consult to assess movement disorder, possible myopathy.  Elevated AST/ALT -Likely due to myopathy, may need to stop or decrease statin.   Ascending colon colitis:  -Blood culture no growth Currently on IV Zosyn for colitis.  Patient denies any nausea or vomiting and abdominal pain.  Transition to oral Augmentin to complete the  course, she already received 3 days of rocephin , 3 days of IV zosyn and plan for 4 more days of Augmentin to complete a 10 day course.   E. coli UTI -Fully treated   Hypokalemia, replace K, check mag  Bilateral pulmonary embolism Patient was started on IV heparin plan to transition to oral Eliquis  Venous duplex of the lower extremities shows acute DVT involving the single left posterior tibial vein. Echocardiogram ordered showed left ventricular ejection fraction of 60 to 65% without any regional wall abnormalities.  Left ventricular diastolic parameters are consistent with grade 1 diastolic dysfunction.  No right heart strain seen.  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   First Covid shot on March 13   DVT Prophylaxis: On Eliquis  Code Status: DNR  Family Communication: Sister over the phone with his permission Disposition Plan:    Patient came from:                            home  Anticipated d/c place: SNF  Barriers to d/c OR conditions which need to be met to effect a safe d/c:  Difficult placement due to lack of insurance  Consultants:  Cardiology  Psychiatry  Dentistry  Palliative care  PT/OT/social worker  Dietitian  Neurology  Procedures:  None  Antibiotics:  As described above   Objective: BP 130/62 (BP Location: Right Arm)   Pulse 82   Temp 97.8 F (36.6 C) (Oral)   Resp 16   Ht _0  (1.575 m)   Wt 85.3 kg   SpO2 100%   BMI 34.40 kg/m     Intake/Output Summary (Last 24 hours) at 08/06/2019 1121 Last data filed at 08/05/2019 2124 Gross per 24 hour  Intake 160 ml  Output 490 ml  Net -330 ml   Filed Weights   08/01/19 1700 08/05/19 0424 08/06/19 0334  Weight: 81.6 kg 85.2 kg 85.3 kg    Exam: Patient is examined daily including today on 08/06/2019, exams remain the same as of yesterday except that has changed    General:  NAD, aaox3  Cardiovascular: RRR  Respiratory: CTABL  Abdomen: Soft/ND/NT, positive BS  Musculoskeletal: No Edema  Neuro: alert, oriented x3, bilateral upper extremity weakness, bilateral lower extremity weakness barely able to lift up against gravity  Data Reviewed: Basic Metabolic Panel: Recent Labs  Lab 07/31/19 1847 08/01/19 0545 08/03/19 0322 08/05/19 0353 08/06/19 0533  NA 140 139 143 139 142  K 4.2 4.0 3.6 3.1* 4.7  CL 108 108 109 109 110  CO2 22 20* 23 21* 23  GLUCOSE 84 121* 105* 129* 126*  BUN _1 CREATININE 0.96 1.01* 1.18* 1.19* 1.09*  CALCIUM 9.0 8.7* 8.7* 8.6* 8.9  MG 1.8  --  1.8  --  2.0  PHOS 1.9*  --  2.5  --   --    Liver Function Tests: Recent Labs  Lab 08/06/19 0533  AST 72*  ALT 52*  ALKPHOS 54  BILITOT 1.1  PROT 5.1*  ALBUMIN 2.5*   No results for input(s): LIPASE, AMYLASE in the last 168 hours. No results for input(s): AMMONIA in the last 168 hours. CBC: Recent Labs  Lab 08/01/19 0545 08/02/19 0108 08/03/19 0322 08/04/19 0229 08/05/19 0353  WBC 4.8 4.9 5.0 4.9 4.3  HGB 14.5 14.1 14.6 13.8 13.5  HCT 43.5 41.1 42.5 40.6 40.1  MCV 91.4 88.8 88.4 88.5 89.3  PLT 101* 105* 101* 100* 100*   Cardiac Enzymes:   Recent Labs  Lab 08/06/19 0533  CKTOTAL 308*   BNP (last 3 results) Recent Labs    07/26/19 0810  BNP 64.2    ProBNP (last 3 results) No results for input(s): PROBNP in the last 8760 hours.  CBG: Recent Labs  Lab 08/05/19 1221 08/05/19 1645 08/05/19 2028 08/05/19 2129 08/06/19 0610  GLUCAP 110* 117* 133*  106* 121*    No results found for this or any previous visit (from the past 240 hour(s)).   Studies: MR BRAIN WO CONTRAST  Result Date: 08/05/2019 CLINICAL DATA:  Stroke suspected. EXAM: MRI HEAD WITHOUT CONTRAST TECHNIQUE: Multiplanar, multiecho pulse sequences of the brain and surrounding structures were obtained without intravenous contrast. COMPARISON:  Brain MRI 07/30/2019 FINDINGS: Brain: There is no evidence of acute infarct. No evidence of intracranial mass. No midline shift or extra-axial fluid collection. Redemonstrated chronic cortical/subcortical infarcts in the posterior right cerebral hemisphere involving portions of the right temporal, occipital and parietal lobes. Redemonstrated chronic cortical/subcortical infarcts within  the right frontal lobe. Unchanged chronic lacunar infarcts within the right corona radiata and bilateral basal ganglia. Stable background mild chronic small vessel ischemic disease. As before, there is chronic hemosiderin deposition associated with the right cerebral infarcts and within the bilateral basal ganglia. Stable, mild generalized parenchymal atrophy. Vascular: Flow voids maintained within the proximal large arterial vessels. Skull and upper cervical spine: No focal marrow lesion. Sinuses/Orbits: Visualized orbits demonstrate no acute abnormality. Minimal ethmoid sinus mucosal thickening. Trace fluid within bilateral mastoid air cells. IMPRESSION: No evidence of acute intracranial abnormality, including acute infarction. Redemonstrated multifocal chronic cortical/subcortical infarcts within the right cerebral hemisphere. Stable chronic small vessel ischemic disease with multiple chronic lacunar infarcts. Mild generalized parenchymal atrophy, unchanged. Electronically Signed   By: Kellie Simmering DO   On: 08/05/2019 22:44    Scheduled Meds: . amoxicillin-clavulanate  500 mg Oral Q12H  . antiseptic oral rinse  15 mL Mouth Rinse BID  . apixaban  10 mg Oral BID    Followed by  . [START ON 08/11/2019] apixaban  5 mg Oral BID  . atorvastatin  80 mg Oral Daily  . clopidogrel  75 mg Oral Daily  . feeding supplement (ENSURE ENLIVE)  237 mL Oral QID  . hydrALAZINE  25 mg Oral Q8H  . insulin aspart  0-15 Units Subcutaneous TID WC  . insulin aspart  0-5 Units Subcutaneous QHS  . latanoprost  1 drop Both Eyes QHS  . lisinopril  20 mg Oral Daily  . nicotine  21 mg Transdermal Daily  . nystatin  5 mL Oral QID  . polyethylene glycol  17 g Oral BID  . senna  1 tablet Oral QHS  . sodium chloride flush  3 mL Intravenous Once  . traZODone  100 mg Oral QHS    Continuous Infusions: . fluconazole (DIFLUCAN) IV 100 mg (08/05/19 2024)     Time spent: 56mns, case discussed with neurology Dr. LCheral MarkerI have personally reviewed and interpreted on  08/06/2019 daily labs, tele strips, imagings as discussed above under date review session and assessment and plans.  I reviewed all nursing notes, pharmacy notes, consultant notes,  vitals, pertinent old records  I have discussed plan of care as described above with RN , patient and family on 08/06/2019   FFlorencia ReasonsMD, PhD, FACP  [facility]  Available via Epic secure chat 7am-7pm for nonurgent issues Please page for urgent issues, pager number available through [facility] .   08/06/2019, 11:21 AM  LOS: 10 days

## 2019-08-06 NOTE — Plan of Care (Signed)
  Problem: Education: Goal: Knowledge of General Education information will improve Description: Including pain rating scale, medication(s)/side effects and non-pharmacologic comfort measures Outcome: Progressing   Problem: Clinical Measurements: Goal: Will remain free from infection Outcome: Progressing Goal: Diagnostic test results will improve Outcome: Progressing Goal: Respiratory complications will improve Outcome: Progressing Goal: Cardiovascular complication will be avoided Outcome: Progressing   Problem: Activity: Goal: Risk for activity intolerance will decrease Outcome: Progressing   Problem: Nutrition: Goal: Adequate nutrition will be maintained Outcome: Progressing   Problem: Coping: Goal: Level of anxiety will decrease Outcome: Progressing   Problem: Elimination: Goal: Will not experience complications related to bowel motility Outcome: Progressing Goal: Will not experience complications related to urinary retention Outcome: Progressing   Problem: Pain Managment: Goal: General experience of comfort will improve Outcome: Progressing   Problem: Safety: Goal: Ability to remain free from injury will improve Outcome: Progressing   Problem: Skin Integrity: Goal: Risk for impaired skin integrity will decrease Outcome: Progressing   

## 2019-08-07 ENCOUNTER — Inpatient Hospital Stay (HOSPITAL_COMMUNITY): Payer: Self-pay

## 2019-08-07 LAB — GLUCOSE, CAPILLARY
Glucose-Capillary: 102 mg/dL — ABNORMAL HIGH (ref 70–99)
Glucose-Capillary: 137 mg/dL — ABNORMAL HIGH (ref 70–99)
Glucose-Capillary: 94 mg/dL (ref 70–99)
Glucose-Capillary: 99 mg/dL (ref 70–99)
Glucose-Capillary: 83 mg/dL (ref 70–99)

## 2019-08-07 LAB — CBC
HCT: 38.1 % (ref 36.0–46.0)
Hemoglobin: 12.8 g/dL (ref 12.0–15.0)
MCH: 30.3 pg (ref 26.0–34.0)
MCHC: 33.6 g/dL (ref 30.0–36.0)
MCV: 90.1 fL (ref 80.0–100.0)
Platelets: 113 10*3/uL — ABNORMAL LOW (ref 150–400)
RBC: 4.23 MIL/uL (ref 3.87–5.11)
RDW: 14.1 % (ref 11.5–15.5)
WBC: 5.3 10*3/uL (ref 4.0–10.5)
nRBC: 0 % (ref 0.0–0.2)

## 2019-08-07 LAB — COMPREHENSIVE METABOLIC PANEL
ALT: 73 U/L — ABNORMAL HIGH (ref 0–44)
AST: 84 U/L — ABNORMAL HIGH (ref 15–41)
Albumin: 2.6 g/dL — ABNORMAL LOW (ref 3.5–5.0)
Alkaline Phosphatase: 63 U/L (ref 38–126)
Anion gap: 12 (ref 5–15)
BUN: 11 mg/dL (ref 8–23)
CO2: 23 mmol/L (ref 22–32)
Calcium: 8.9 mg/dL (ref 8.9–10.3)
Chloride: 106 mmol/L (ref 98–111)
Creatinine, Ser: 1.06 mg/dL — ABNORMAL HIGH (ref 0.44–1.00)
GFR calc Af Amer: >60 mL/min (ref >60)
GFR calc non Af Amer: 55 mL/min — ABNORMAL LOW (ref >60)
Glucose, Bld: 119 mg/dL — ABNORMAL HIGH (ref 70–99)
Potassium: 3.5 mmol/L (ref 3.5–5.1)
Sodium: 141 mmol/L (ref 135–145)
Total Bilirubin: 0.5 mg/dL (ref 0.3–1.2)
Total Protein: 5.7 g/dL — ABNORMAL LOW (ref 6.5–8.1)

## 2019-08-07 LAB — CK: Total CK: 185 U/L (ref 38–234)

## 2019-08-07 MED ORDER — LORAZEPAM 2 MG/ML IJ SOLN
1.0000 mg | Freq: Once | INTRAMUSCULAR | Status: AC | PRN
  Administered 2019-08-07: 08:00:00 1 mg via INTRAVENOUS
  Filled 2019-08-07: qty 1

## 2019-08-07 MED ORDER — POTASSIUM CHLORIDE 2 MEQ/ML IV SOLN
INTRAVENOUS | Status: AC
  Filled 2019-08-07 (×2): qty 1000

## 2019-08-07 MED ORDER — GADOBUTROL 1 MMOL/ML IV SOLN
9.0000 mL | Freq: Once | INTRAVENOUS | Status: AC | PRN
  Administered 2019-08-07: 9 mL via INTRAVENOUS

## 2019-08-07 MED ORDER — LORAZEPAM BOLUS VIA INFUSION: 1.0000 mg | Freq: Once | INTRAVENOUS | Status: DC | PRN

## 2019-08-07 NOTE — Progress Notes (Signed)
MRI of cervical, thoracic and lumbar spine with and without contrast has been ordered.   Electronically signed: Dr. Caryl Pina

## 2019-08-07 NOTE — Progress Notes (Signed)
PT Cancellation Note  Patient Details Name: Denise Santiago MRN: [id number] DOB: 05/16/1955   Cancelled Treatment:    Reason Eval/Treat Not Completed: (P) Patient not medically ready(pt given ativan pre MRI, she is sleepy deeply and would not rouse for PT session.  Will f/u per POC.)   Kameo Bains Artis Delay 08/07/2019, 3:06 PM  Bonney Leitz , PTA Acute Rehabilitation Services Pager [phone] Office [phone]

## 2019-08-07 NOTE — Progress Notes (Signed)
PROGRESS NOTE  Denise Santiago YCX:[id number] DOB: 1955-11-15 DOA: 07/26/2019 PCP: Seward Carol, MD  HPI/Recap of past 24 hours:  She is drowsy after returning from MRI Poor oral intake remains very poor,  She is very weak  Calorie count underway  Assessment/Plan: Principal Problem:   Major depressive disorder Active Problems:   Essential hypertension   Type 2 diabetes mellitus without complication (HCC)   CVA (cerebral vascular accident) ([place])   Hyperlipidemia   Generalized weakness   Hypokalemia   Hyponatremia   Acute on chronic kidney failure (HCC)   Thrombocytopenia (HCC)   Elevated troponin   Lactic acidosis   AKI (acute kidney injury) ([place])   Palliative care by specialist   Goals of care, counseling/discussion   Xerostomia   Dry lips   Failure to thrive in adult   Physical debility   Malnutrition of moderate degree   Esophageal thrush ([place])   Palliative care encounter   Pulmonary embolism ([place])   DVT (deep venous thrombosis) ([place])   Thrush, oral   Colitis    FTT/progressive weakness/poor oral intake with reported weight loss: Per sister patient started have strange behavior since Christmas, she started eat less But patient has been able to perform ADLs even driving to work on March 15 Per RN and physical therapist, patient is weaker today,   repeat MRI of the brain no acute findings, CK mildly elevated to 300 range, (she has been on Lipitor Alice since August 4970, now on increased dose) -May need to stop or decrease statin -neurology Dr. Cheral Marker consulted to assess movement disorder, possible myopathy. -We will follow recommendation  Elevated AST/ALT -Likely due to myopathy, may need to stop or decrease statin.   Ascending colon colitis:  -Blood culture no growth Currently on IV Zosyn for colitis.  Patient denies any nausea or vomiting and abdominal pain.  Transition to oral Augmentin to complete the course, she already received 3 days of  rocephin , 3 days of IV zosyn and plan for 4 more days of Augmentin to complete a 10 day course.   E. coli UTI -Fully treated  Urinary retention, required in and out cath last night with 500 cc urine drained Continue bladder scan, may require in and out cath versus indwelling Foley   Hypokalemia, replace K, check mag  Bilateral pulmonary embolism Patient was started on IV heparin plan to transition to oral Eliquis  Venous duplex of the lower extremities shows acute DVT involving the single left posterior tibial vein. Echocardiogram ordered showed left ventricular ejection fraction of 60 to 65% without any regional wall abnormalities.  Left ventricular diastolic parameters are consistent with grade 1 diastolic dysfunction.  No right heart strain seen.  Thrombocytopenia, acute on chronic -Platelet 88 on presentation, improving, today is 113  AKI superimposed on stage II CKD Creatinine 1.38 on presentation, today is 1. 06 Repeat BMP in the morning Monitor renal function  History of recurrent CVA -MRI brain obtained March 25 no acute CVA -Continue Plavix and statin -She is on Eliquis now due to PE  Constipation; continue MiraLAX Senokot, will add on suppository for now  Body mass index is 35.65 kg/m.   First Covid shot on March 13   DVT Prophylaxis: On Eliquis  Code Status: DNR  Family Communication: Sister over the phone with his permission on April 1 Disposition Plan:    Patient came from:  home                                                                              Anticipated d/c place: SNF  Barriers to d/c OR conditions which need to be met to effect a safe d/c:  Difficult placement due to lack of insurance  Consultants:  Cardiology  Psychiatry  Dentistry  Palliative care  PT/OT/social worker  Dietitian  Neurology  Procedures:  None  Antibiotics:  As described above   Objective: BP (!) 121/51 (BP  Location: Left Arm)    Pulse 88    Temp (!) 97.4 F (36.3 C) (Oral)    Resp 16    Ht _0  (1.575 m)    Wt 88.4 kg    SpO2 99%    BMI 35.65 kg/m   Intake/Output Summary (Last 24 hours) at 08/07/2019 1518 Last data filed at 08/07/2019 1660 Gross per 24 hour  Intake --  Output 600 ml  Net -600 ml   Filed Weights   08/05/19 0424 08/06/19 0334 08/07/19 0500  Weight: 85.2 kg 85.3 kg 88.4 kg    Exam: Patient is examined daily including today on 08/07/2019, exams remain the same as of yesterday except that has changed    General:  NAD, drowsy after Ativan  Cardiovascular: RRR  Respiratory: CTABL  Abdomen: Soft/ND/NT, positive BS  Musculoskeletal: No Edema  Neuro: Drowsy ,bilateral upper extremity weakness, bilateral lower extremity weakness barely able to lift up against gravity  Data Reviewed: Basic Metabolic Panel: Recent Labs  Lab 07/31/19 1847 07/31/19 1847 08/01/19 0545 08/03/19 0322 08/05/19 0353 08/06/19 0533 08/07/19 0508  NA 140   < > 139 143 139 142 141  K 4.2   < > 4.0 3.6 3.1* 4.7 3.5  CL 108   < > 108 109 109 110 106  CO2 22   < > 20* 23 21* 23 23  GLUCOSE 84   < > 121* 105* 129* 126* 119*  BUN 15   < > _1 CREATININE 0.96   < > 1.01* 1.18* 1.19* 1.09* 1.06*  CALCIUM 9.0   < > 8.7* 8.7* 8.6* 8.9 8.9  MG 1.8  --   --  1.8  --  2.0  --   PHOS 1.9*  --   --  2.5  --   --   --    < > = values in this interval not displayed.   Liver Function Tests: Recent Labs  Lab 08/06/19 0533 08/07/19 0508  AST 72* 84*  ALT 52* 73*  ALKPHOS 54 63  BILITOT 1.1 0.5  PROT 5.1* 5.7*  ALBUMIN 2.5* 2.6*   No results for input(s): LIPASE, AMYLASE in the last 168 hours. No results for input(s): AMMONIA in the last 168 hours. CBC: Recent Labs  Lab 08/02/19 0108 08/03/19 0322 08/04/19 0229 08/05/19 0353 08/07/19 0508  WBC 4.9 5.0 4.9 4.3 5.3  HGB 14.1 14.6 13.8 13.5 12.8  HCT 41.1 42.5 40.6 40.1 38.1  MCV 88.8 88.4 88.5 89.3 90.1  PLT 105* 101* 100*  100* 113*   Cardiac Enzymes:   Recent Labs  Lab 08/06/19 0533 08/07/19 0508  CKTOTAL  308* 185   BNP (last 3 results) Recent Labs    07/26/19 0810  BNP 64.2    ProBNP (last 3 results) No results for input(s): PROBNP in the last 8760 hours.  CBG: Recent Labs  Lab 08/06/19 1700 08/06/19 2106 08/07/19 0633 08/07/19 0758 08/07/19 1143  GLUCAP 85 88 102* 137* 94    No results found for this or any previous visit (from the past 240 hour(s)).   Studies: MR CERVICAL SPINE W WO CONTRAST  Result Date: 08/07/2019 CLINICAL DATA:  Myelopathy. Weakness. EXAM: MRI CERVICAL SPINE WITHOUT AND WITH CONTRAST TECHNIQUE: Multiplanar and multiecho pulse sequences of the cervical spine, to include the craniocervical junction and cervicothoracic junction, were obtained without and with intravenous contrast. CONTRAST:  38m GADAVIST GADOBUTROL 1 MMOL/ML IV SOLN COMPARISON:  Cervical spine CT 03/19/2019 FINDINGS: The study is severely motion degraded despite repeat imaging. Alignment: Normal. Vertebrae: No fracture. Chronically advanced disc degeneration at C5-6 and C6-7 with disc space narrowing and degenerative fatty marrow changes in addition to low level edema and patchy enhancement in the C5 and C6 vertebral bodies. No fluid signal in the C5-6 or C6-7 disc spaces or gross endplate erosion. Cord: Limited assessment due to motion artifact. No definite cord signal abnormality or abnormal enhancement. Posterior Fossa, vertebral arteries, paraspinal tissues: Small volume prevertebral fluid throughout the cervical spine with mild diffuse prevertebral enhancement. Partially visualized lipoma in the posterior right lower neck/suprascapular region. Disc levels: Detailed characterization of degenerative changes is limited by motion. Disc degeneration is greatest at C5-6 and C6-7 where broad-based posterior disc osteophyte complexes result in mild-to-moderate spinal stenosis and likely moderate bilateral neural  foraminal stenosis. There is no gross cord compression. Multilevel facet arthrosis is asymmetrically worse on the left. IMPRESSION: 1. Severely motion degraded examination. 2. Small volume prevertebral fluid and mild prevertebral enhancement throughout the cervical spine of uncertain etiology. There is low level marrow edema in the C5 and C6 vertebral bodies, however the appearance is more suggestive of degenerative changes without convincing evidence of discitis. 3. Disc degeneration greatest at C5-6 and C6-7 with suspected mild-to-moderate spinal stenosis and moderate neural foraminal stenosis. 4. No gross cervical spinal cord signal abnormality or cord compression. Electronically Signed   By: ALogan BoresM.D.   On: 08/07/2019 12:04   MR THORACIC SPINE W WO CONTRAST  Result Date: 08/07/2019 CLINICAL DATA:  Myelopathy. Weakness. EXAM: MRI THORACIC WITHOUT AND WITH CONTRAST TECHNIQUE: Multiplanar and multiecho pulse sequences of the thoracic spine were obtained without and with intravenous contrast. CONTRAST:  939mGADAVIST GADOBUTROL 1 MMOL/ML IV SOLN COMPARISON:  CT chest 08/01/2019 FINDINGS: The study is severely motion degraded. Alignment: Normal. Vertebrae: Diffusely heterogeneous bone marrow signal, nonspecific. No destructive osseous lesion, fracture, or evidence of discitis. Cord:  Normal cord signal and morphology. Paraspinal and other soft tissues: Trace bilateral pleural effusions. Prevertebral enhancement in the included lower cervical spine extending to the T2 level. No paraspinal fluid collection. Disc levels: Mild thoracic spondylosis without evidence of significant spinal stenosis, spinal cord mass effect, or compressive neural foraminal stenosis. IMPRESSION: 1. Severely motion degraded examination. 2. Prevertebral enhancement in the lower cervical and upper thoracic spine. See separate cervical spine MRI report. 3. No evidence of discitis or osteomyelitis in the thoracic spine. 4. Mild thoracic  spondylosis without significant stenosis. Electronically Signed   By: AlLogan Bores.D.   On: 08/07/2019 12:12   MR Lumbar Spine W Wo Contrast  Result Date: 08/07/2019 CLINICAL DATA:  Myelopathy. Weakness. EXAM: MRI  LUMBAR SPINE WITHOUT AND WITH CONTRAST TECHNIQUE: Multiplanar and multiecho pulse sequences of the lumbar spine were obtained without and with intravenous contrast. CONTRAST:  19m GADAVIST GADOBUTROL 1 MMOL/ML IV SOLN COMPARISON:  CT abdomen and pelvis 08/01/2019 FINDINGS: The study is moderately to severely motion degraded. Segmentation: Standard. Alignment:  Normal. Vertebrae: Diffusely heterogeneous bone marrow signal, nonspecific. No destructive osseous lesion, fracture, or evidence of discitis. Conus medullaris and cauda equina: Conus extends to the L2 level. Conus and cauda equina appear normal. Paraspinal and other soft tissues: No paraspinal fluid collection. Disc levels: L1-2: Minimal disc bulging and mild facet hypertrophy without stenosis. L2-3: Mild disc bulging and mild facet hypertrophy without stenosis. L3-4: Mild disc bulging and mild facet hypertrophy without stenosis. L4-5: Disc bulging slightly eccentric to the left and severe facet hypertrophy result in mild spinal stenosis and mild right and moderate left neural foraminal stenosis with potential left L4 nerve root impingement. L5-S1: Disc bulging eccentric to the left and severe facet hypertrophy result in mild right and moderate left neural foraminal stenosis with potential left L5 nerve root impingement. No spinal stenosis. IMPRESSION: 1. Moderately to severely motion degraded examination. 2. Multilevel disc and facet degeneration, worst at L4-5 where there is mild spinal stenosis and moderate left neural foraminal stenosis. 3. Moderate left neural foraminal stenosis at L5-S1. Electronically Signed   By: ALogan BoresM.D.   On: 08/07/2019 12:17    Scheduled Meds:  amoxicillin-clavulanate  500 mg Oral Q12H   antiseptic  oral rinse  15 mL Mouth Rinse BID   apixaban  10 mg Oral BID   Followed by   [Derrill MemoON 08/11/2019] apixaban  5 mg Oral BID   atorvastatin  80 mg Oral Daily   bisacodyl  10 mg Rectal Daily   clopidogrel  75 mg Oral Daily   feeding supplement (ENSURE ENLIVE)  237 mL Oral QID   hydrALAZINE  25 mg Oral Q8H   insulin aspart  0-15 Units Subcutaneous TID WC   insulin aspart  0-5 Units Subcutaneous QHS   latanoprost  1 drop Both Eyes QHS   lisinopril  20 mg Oral Daily   nicotine  21 mg Transdermal Daily   nystatin  5 mL Oral QID   polyethylene glycol  17 g Oral BID   senna  1 tablet Oral QHS   sodium chloride flush  3 mL Intravenous Once   traZODone  100 mg Oral QHS    Continuous Infusions:  fluconazole (DIFLUCAN) IV 100 mg (08/06/19 2116)   lactated ringers with kcl       Time spent: 341ms,  I have personally reviewed and interpreted on  08/07/2019 daily labs, tele strips, imagings as discussed above under date review session and assessment and plans.  I reviewed all nursing notes, pharmacy notes, consultant notes,  vitals, pertinent old records  I have discussed plan of care as described above with RN , patient  on 08/07/2019   FaFlorencia ReasonsD, PhD, FACP  [facility]  Available via Epic secure chat 7am-7pm for nonurgent issues Please page for urgent issues, pager number available through [email or web address] .   08/07/2019, 3:18 PM  LOS: 11 days

## 2019-08-08 DIAGNOSIS — G610 Guillain-Barre syndrome: Principal | ICD-10-CM

## 2019-08-08 LAB — COMPREHENSIVE METABOLIC PANEL
ALT: 66 U/L — ABNORMAL HIGH (ref 0–44)
AST: 64 U/L — ABNORMAL HIGH (ref 15–41)
Albumin: 2.5 g/dL — ABNORMAL LOW (ref 3.5–5.0)
Alkaline Phosphatase: 60 U/L (ref 38–126)
Anion gap: 9 (ref 5–15)
BUN: 12 mg/dL (ref 8–23)
CO2: 24 mmol/L (ref 22–32)
Calcium: 8.7 mg/dL — ABNORMAL LOW (ref 8.9–10.3)
Chloride: 110 mmol/L (ref 98–111)
Creatinine, Ser: 1.01 mg/dL — ABNORMAL HIGH (ref 0.44–1.00)
GFR calc Af Amer: >60 mL/min (ref >60)
GFR calc non Af Amer: 59 mL/min — ABNORMAL LOW (ref >60)
Glucose, Bld: 105 mg/dL — ABNORMAL HIGH (ref 70–99)
Potassium: 3.7 mmol/L (ref 3.5–5.1)
Sodium: 143 mmol/L (ref 135–145)
Total Bilirubin: 0.8 mg/dL (ref 0.3–1.2)
Total Protein: 5.4 g/dL — ABNORMAL LOW (ref 6.5–8.1)

## 2019-08-08 LAB — GLUCOSE, CAPILLARY
Glucose-Capillary: 99 mg/dL (ref 70–99)
Glucose-Capillary: 94 mg/dL (ref 70–99)
Glucose-Capillary: 89 mg/dL (ref 70–99)
Glucose-Capillary: 86 mg/dL (ref 70–99)

## 2019-08-08 LAB — HEPATITIS PANEL, ACUTE
HCV Ab: NONREACTIVE
Hep A IgM: NONREACTIVE
Hep B C IgM: NONREACTIVE
Hepatitis B Surface Ag: NONREACTIVE

## 2019-08-08 LAB — AMMONIA: Ammonia: 20 umol/L (ref 9–35)

## 2019-08-08 LAB — MAGNESIUM: Magnesium: 2 mg/dL (ref 1.7–2.4)

## 2019-08-08 LAB — RPR: RPR Ser Ql: NONREACTIVE

## 2019-08-08 LAB — CK: Total CK: 206 U/L (ref 38–234)

## 2019-08-08 MED ORDER — CHLORHEXIDINE GLUCONATE CLOTH 2 % EX PADS
6.0000 | MEDICATED_PAD | Freq: Every day | CUTANEOUS | Status: DC
  Administered 2019-08-08 – 2019-08-23 (×15): 6 via TOPICAL

## 2019-08-08 MED ORDER — IMMUNE GLOBULIN (HUMAN) 10 GM/100ML IV SOLN
400.0000 mg/kg | INTRAVENOUS | Status: AC
  Administered 2019-08-08 – 2019-08-12 (×5): 35 g via INTRAVENOUS
  Filled 2019-08-08: qty 100
  Filled 2019-08-08: qty 50
  Filled 2019-08-08: qty 200
  Filled 2019-08-08: qty 300
  Filled 2019-08-08: qty 200

## 2019-08-08 MED ORDER — MIRTAZAPINE 15 MG PO TABS
7.5000 mg | ORAL_TABLET | Freq: Every day | ORAL | Status: DC
  Administered 2019-08-08 – 2019-08-11 (×4): 7.5 mg via ORAL
  Filled 2019-08-08 (×5): qty 1

## 2019-08-08 MED ORDER — POTASSIUM CHLORIDE 2 MEQ/ML IV SOLN
INTRAVENOUS | Status: AC
  Filled 2019-08-08 (×4): qty 1000

## 2019-08-08 NOTE — Progress Notes (Signed)
ANTICOAGULATION CONSULT NOTE   Pharmacy Consult for apixaban Indication: pulmonary embolus, DVT  Labs: Recent Labs    08/06/19 0533 08/07/19 0508 08/08/19 0420  HGB  --  12.8  --   HCT  --  38.1  --   PLT  --  113*  --   CREATININE 1.09* 1.06* 1.01*  CKTOTAL 308* 185 206    Estimated Creatinine Clearance: 56.6 mL/min (A) (by C-G formula based on SCr of 1.01 mg/dL (H)).   Assessment: 64 yr old female with PE seen on CT scan. In addition to bilateral PE, venous duplex of the lower extremities on 08/02/19 showed acute DVT involving the single left posterior tibial vein. No anticoagulation noted PTA. Baseline thrombocytopenia noted. No bleeding noted.   Plan:  Apixaban 10 mg PO BID X 7 days (thru am 4/6), followed by apixaban 5 mg PO BID Education completed  Pharmacy signing off but will continue to follow peripherally - please re-consult if needed  Thank you for involving pharmacy in this patient's care.  Loura Back, PharmD, BCPS Clinical Pharmacist Clinical phone for 08/08/2019 until 3p is [phone] 08/08/2019 10:37 AM  **Pharmacist phone directory can be found on [email or web address] listed under [facility]**

## 2019-08-08 NOTE — Progress Notes (Addendum)
Subjective: Patient awake, alert, NAD. No family at bedside.   Objective: Current vital signs: BP (!) 151/81 (BP Location: Left Arm)   Pulse 87   Temp (!) 97.5 F (36.4 C) (Oral)   Resp 18   Ht  (1.575 m)   Wt 84.2 kg   SpO2 98%   BMI 33.95 kg/m  Vital signs in last 24 hours: Temp:  [97.4 F (36.3 C)-97.9 F (36.6 C)] 97.5 F (36.4 C) (04/03 0730) Pulse Rate:  [80-95] 87 (04/03 0730) Resp:  [14-20] 18 (04/03 0730) BP: (115-174)/(51-90) 151/81 (04/03 0730) SpO2:  [48 %-100 %] 98 % (04/03 0730) Weight:  [84.2 kg] 84.2 kg (04/03 0500)  Intake/Output from previous day: 04/02 0701 - 04/03 0700 In: 1966.7 [I.V.:1916.7; IV Piggyback:50] Out: 1250 [Urine:1250] Intake/Output this shift: No intake/output data recorded. Nutritional status:  Diet Order            DIET - DYS 1 Room service appropriate? Yes; Fluid consistency: Thin  Diet effective now              Physical Exam  Constitutional: Appears well-developed and well-nourished.  Psych: Affect appropriate to situation Eyes: Normal external eye and conjunctiva. HENT: Normocephalic, no lesions, without obvious abnormality.  Mucopus membranes dry. Musculoskeletal-no joint tenderness, deformity or swelling Cardiovascular: Normal rate and regular rhythm.  Respiratory: Effort normal, non-labored breathing saturations WNL on RA GI: Soft.  No distension. There is no tenderness.  Skin: WDI  Neurologic Exam: Ment: Awake, alert, oriented x 4.no dysarthria (mouth dry) CN: PERRL, VFF, EOEMI , no nystagmus noted. Face symmetric. Facial LT sensation intact. Midline tongue extension Motor: triceps 4-/5 bilaterally Deltoids 3-4-/5 bilaterally Hip flexion 3-/5 bilaterally Cannot raise BLE antigravity. DTR: patellar  And achilles 0 bilaterally Brachioradialis: 1+ bilaterally Cerebellar: unable to test HTS Dysmetria present with FNF bilaterally Gait: UTA  Lab Results: Results for orders placed or performed during the  hospital encounter of 07/26/19 (from the past 48 hour(s))  Glucose, capillary     Status: Abnormal   Collection Time: 08/06/19 12:34 PM  Result Value Ref Range   Glucose-Capillary 108 (H) 70 - 99 mg/dL    Comment: Glucose reference range applies only to samples taken after fasting for at least 8 hours.   Comment 1 Notify RN    Comment 2 Document in Chart   Glucose, capillary     Status: None   Collection Time: 08/06/19  5:00 PM  Result Value Ref Range   Glucose-Capillary 85 70 - 99 mg/dL    Comment: Glucose reference range applies only to samples taken after fasting for at least 8 hours.   Comment 1 Notify RN    Comment 2 Document in Chart   Glucose, capillary     Status: None   Collection Time: 08/06/19  9:06 PM  Result Value Ref Range   Glucose-Capillary 88 70 - 99 mg/dL    Comment: Glucose reference range applies only to samples taken after fasting for at least 8 hours.   Comment 1 Notify RN    Comment 2 Document in Chart   Comprehensive metabolic panel     Status: Abnormal   Collection Time: 08/07/19  5:08 AM  Result Value Ref Range   Sodium 141 135 - 145 mmol/L   Potassium 3.5 3.5 - 5.1 mmol/L   Chloride 106 98 - 111 mmol/L   CO2 23 22 - 32 mmol/L   Glucose, Bld 119 (H) 70 - 99 mg/dL    Comment: Glucose  reference range applies only to samples taken after fasting for at least 8 hours.   BUN 11 8 - 23 mg/dL   Creatinine, Ser 0.07 (H) 0.44 - 1.00 mg/dL   Calcium 8.9 8.9 - 12.1 mg/dL   Total Protein 5.7 (L) 6.5 - 8.1 g/dL   Albumin 2.6 (L) 3.5 - 5.0 g/dL   AST 84 (H) 15 - 41 U/L   ALT 73 (H) 0 - 44 U/L   Alkaline Phosphatase 63 38 - 126 U/L   Total Bilirubin 0.5 0.3 - 1.2 mg/dL   GFR calc non Af Amer 55 (L) >60 mL/min   GFR calc Af Amer >60 >60 mL/min   Anion gap 12 5 - 15    Comment: Performed at [facility], [address], [place], [place] [address]  CK     Status: None   Collection Time: 08/07/19  5:08 AM  Result Value Ref Range   Total CK 185 38 - 234 U/L     Comment: Performed at [facility], [address], [place], [place] [address]  CBC     Status: Abnormal   Collection Time: 08/07/19  5:08 AM  Result Value Ref Range   WBC 5.3 4.0 - 10.5 K/uL   RBC 4.23 3.87 - 5.11 MIL/uL   Hemoglobin 12.8 12.0 - 15.0 g/dL   HCT 82.6 41.5 - 83.0 %   MCV 90.1 80.0 - 100.0 fL   MCH 30.3 26.0 - 34.0 pg   MCHC 33.6 30.0 - 36.0 g/dL   RDW 94.0 76.8 - 08.8 %   Platelets 113 (L) 150 - 400 K/uL    Comment: REPEATED TO VERIFY Immature Platelet Fraction may be clinically indicated, consider ordering this additional test [id number] CONSISTENT WITH PREVIOUS RESULT    nRBC 0.0 0.0 - 0.2 %    Comment: Performed at [facility], [address], [place], [place] [address]  Glucose, capillary     Status: Abnormal   Collection Time: 08/07/19  6:33 AM  Result Value Ref Range   Glucose-Capillary 102 (H) 70 - 99 mg/dL    Comment: Glucose reference range applies only to samples taken after fasting for at least 8 hours.   Comment 1 Notify RN    Comment 2 Document in Chart   Glucose, capillary     Status: Abnormal   Collection Time: 08/07/19  7:58 AM  Result Value Ref Range   Glucose-Capillary 137 (H) 70 - 99 mg/dL    Comment: Glucose reference range applies only to samples taken after fasting for at least 8 hours.  Glucose, capillary     Status: None   Collection Time: 08/07/19 11:43 AM  Result Value Ref Range   Glucose-Capillary 94 70 - 99 mg/dL    Comment: Glucose reference range applies only to samples taken after fasting for at least 8 hours.  Glucose, capillary     Status: None   Collection Time: 08/07/19  3:32 PM  Result Value Ref Range   Glucose-Capillary 99 70 - 99 mg/dL    Comment: Glucose reference range applies only to samples taken after fasting for at least 8 hours.  Glucose, capillary     Status: None   Collection Time: 08/07/19  9:12 PM  Result Value Ref Range   Glucose-Capillary 83 70 - 99 mg/dL    Comment: Glucose reference  range applies only to samples taken after fasting for at least 8 hours.  Comprehensive metabolic panel  Status: Abnormal   Collection Time: 08/08/19  4:20 AM  Result Value Ref Range   Sodium 143 135 - 145 mmol/L   Potassium 3.7 3.5 - 5.1 mmol/L   Chloride 110 98 - 111 mmol/L   CO2 24 22 - 32 mmol/L   Glucose, Bld 105 (H) 70 - 99 mg/dL    Comment: Glucose reference range applies only to samples taken after fasting for at least 8 hours.   BUN 12 8 - 23 mg/dL   Creatinine, Ser 7.61 (H) 0.44 - 1.00 mg/dL   Calcium 8.7 (L) 8.9 - 10.3 mg/dL   Total Protein 5.4 (L) 6.5 - 8.1 g/dL   Albumin 2.5 (L) 3.5 - 5.0 g/dL   AST 64 (H) 15 - 41 U/L   ALT 66 (H) 0 - 44 U/L   Alkaline Phosphatase 60 38 - 126 U/L   Total Bilirubin 0.8 0.3 - 1.2 mg/dL   GFR calc non Af Amer 59 (L) >60 mL/min   GFR calc Af Amer >60 >60 mL/min   Anion gap 9 5 - 15    Comment: Performed at [facility], [address], [place], [place] [address]  Ammonia     Status: None   Collection Time: 08/08/19  4:20 AM  Result Value Ref Range   Ammonia 20 9 - 35 umol/L    Comment: Performed at [facility], [address], [place], [place] [address]  Magnesium     Status: None   Collection Time: 08/08/19  4:20 AM  Result Value Ref Range   Magnesium 2.0 1.7 - 2.4 mg/dL    Comment: Performed at [facility], [address], [place], [place] [address]  CK     Status: None   Collection Time: 08/08/19  4:20 AM  Result Value Ref Range   Total CK 206 38 - 234 U/L    Comment: Performed at [facility], [address] [place], [place] [address]  Glucose, capillary     Status: None   Collection Time: 08/08/19  6:05 AM  Result Value Ref Range   Glucose-Capillary 99 70 - 99 mg/dL    Comment: Glucose reference range applies only to samples taken after fasting for at least 8 hours.    No results found for this or any previous visit (from the past 240 hour(s)).  Lipid Panel No results for input(s): CHOL, TRIG,  HDL, CHOLHDL, VLDL, LDLCALC in the last 72 hours.  Studies/Results: MR CERVICAL SPINE W WO CONTRAST  Result Date: 08/07/2019 CLINICAL DATA:  Myelopathy. Weakness. EXAM: MRI CERVICAL SPINE WITHOUT AND WITH CONTRAST TECHNIQUE: Multiplanar and multiecho pulse sequences of the cervical spine, to include the craniocervical junction and cervicothoracic junction, were obtained without and with intravenous contrast. CONTRAST:  38mL GADAVIST GADOBUTROL 1 MMOL/ML IV SOLN COMPARISON:  Cervical spine CT 03/19/2019 FINDINGS: The study is severely motion degraded despite repeat imaging. Alignment: Normal. Vertebrae: No fracture. Chronically advanced disc degeneration at C5-6 and C6-7 with disc space narrowing and degenerative fatty marrow changes in addition to low level edema and patchy enhancement in the C5 and C6 vertebral bodies. No fluid signal in the C5-6 or C6-7 disc spaces or gross endplate erosion. Cord: Limited assessment due to motion artifact. No definite cord signal abnormality or abnormal enhancement. Posterior Fossa, vertebral arteries, paraspinal tissues: Small volume prevertebral fluid throughout the cervical spine with mild diffuse prevertebral enhancement. Partially visualized lipoma in the posterior right lower neck/suprascapular region. Disc levels:  Detailed characterization of degenerative changes is limited by motion. Disc degeneration is greatest at C5-6 and C6-7 where broad-based posterior disc osteophyte complexes result in mild-to-moderate spinal stenosis and likely moderate bilateral neural foraminal stenosis. There is no gross cord compression. Multilevel facet arthrosis is asymmetrically worse on the left. IMPRESSION: 1. Severely motion degraded examination. 2. Small volume prevertebral fluid and mild prevertebral enhancement throughout the cervical spine of uncertain etiology. There is low level marrow edema in the C5 and C6 vertebral bodies, however the appearance is more suggestive of  degenerative changes without convincing evidence of discitis. 3. Disc degeneration greatest at C5-6 and C6-7 with suspected mild-to-moderate spinal stenosis and moderate neural foraminal stenosis. 4. No gross cervical spinal cord signal abnormality or cord compression. Electronically Signed   By: Logan Bores M.D.   On: 08/07/2019 12:04   MR THORACIC SPINE W WO CONTRAST  Result Date: 08/07/2019 CLINICAL DATA:  Myelopathy. Weakness. EXAM: MRI THORACIC WITHOUT AND WITH CONTRAST TECHNIQUE: Multiplanar and multiecho pulse sequences of the thoracic spine were obtained without and with intravenous contrast. CONTRAST:  25mL GADAVIST GADOBUTROL 1 MMOL/ML IV SOLN COMPARISON:  CT chest 08/01/2019 FINDINGS: The study is severely motion degraded. Alignment: Normal. Vertebrae: Diffusely heterogeneous bone marrow signal, nonspecific. No destructive osseous lesion, fracture, or evidence of discitis. Cord:  Normal cord signal and morphology. Paraspinal and other soft tissues: Trace bilateral pleural effusions. Prevertebral enhancement in the included lower cervical spine extending to the T2 level. No paraspinal fluid collection. Disc levels: Mild thoracic spondylosis without evidence of significant spinal stenosis, spinal cord mass effect, or compressive neural foraminal stenosis. IMPRESSION: 1. Severely motion degraded examination. 2. Prevertebral enhancement in the lower cervical and upper thoracic spine. See separate cervical spine MRI report. 3. No evidence of discitis or osteomyelitis in the thoracic spine. 4. Mild thoracic spondylosis without significant stenosis. Electronically Signed   By: Logan Bores M.D.   On: 08/07/2019 12:12   MR Lumbar Spine W Wo Contrast  Result Date: 08/07/2019 CLINICAL DATA:  Myelopathy. Weakness. EXAM: MRI LUMBAR SPINE WITHOUT AND WITH CONTRAST TECHNIQUE: Multiplanar and multiecho pulse sequences of the lumbar spine were obtained without and with intravenous contrast. CONTRAST:  74mL GADAVIST  GADOBUTROL 1 MMOL/ML IV SOLN COMPARISON:  CT abdomen and pelvis 08/01/2019 FINDINGS: The study is moderately to severely motion degraded. Segmentation: Standard. Alignment:  Normal. Vertebrae: Diffusely heterogeneous bone marrow signal, nonspecific. No destructive osseous lesion, fracture, or evidence of discitis. Conus medullaris and cauda equina: Conus extends to the L2 level. Conus and cauda equina appear normal. Paraspinal and other soft tissues: No paraspinal fluid collection. Disc levels: L1-2: Minimal disc bulging and mild facet hypertrophy without stenosis. L2-3: Mild disc bulging and mild facet hypertrophy without stenosis. L3-4: Mild disc bulging and mild facet hypertrophy without stenosis. L4-5: Disc bulging slightly eccentric to the left and severe facet hypertrophy result in mild spinal stenosis and mild right and moderate left neural foraminal stenosis with potential left L4 nerve root impingement. L5-S1: Disc bulging eccentric to the left and severe facet hypertrophy result in mild right and moderate left neural foraminal stenosis with potential left L5 nerve root impingement. No spinal stenosis. IMPRESSION: 1. Moderately to severely motion degraded examination. 2. Multilevel disc and facet degeneration, worst at L4-5 where there is mild spinal stenosis and moderate left neural foraminal stenosis. 3. Moderate left neural foraminal stenosis at L5-S1. Electronically Signed   By: Logan Bores M.D.   On: 08/07/2019 12:17    Medications:  Scheduled: .  amoxicillin-clavulanate  500 mg Oral Q12H  . antiseptic oral rinse  15 mL Mouth Rinse BID  . apixaban  10 mg Oral BID   Followed by  . [START ON 08/11/2019] apixaban  5 mg Oral BID  . atorvastatin  80 mg Oral Daily  . bisacodyl  10 mg Rectal Daily  . clopidogrel  75 mg Oral Daily  . feeding supplement (ENSURE ENLIVE)  237 mL Oral QID  . hydrALAZINE  25 mg Oral Q8H  . insulin aspart  0-15 Units Subcutaneous TID WC  . insulin aspart  0-5 Units  Subcutaneous QHS  . latanoprost  1 drop Both Eyes QHS  . lisinopril  20 mg Oral Daily  . nicotine  21 mg Transdermal Daily  . nystatin  5 mL Oral QID  . sodium chloride flush  3 mL Intravenous Once  . traZODone  100 mg Oral QHS   Continuous: . fluconazole (DIFLUCAN) IV 100 mg (08/07/19 2158)  . lactated ringers with kcl 50 mL/hr at 08/07/19 1644    Assessment: 64 year old female with a 3 week history of progressive bilateral upper and lower extremity weakness.  1. Exam reveals hypoactive reflexes, 3-4/5 bilateral upper and 3/5 bilateral lower extremity weakness without clear asymmetry or proximal-distal gradient. BUE ataxia is present, slightly worse on the right - unable to test for lower extremity ataxia due to weakness. No oculomotor defect, but there is some weakness to eyelid closure. Uncertain if there is weakness of muscles of mastication or pharyngeal weakness - there is no drooling or gurgling respirations, but speech has somewhat of a nasal quality. No lingual dysarthria, but patient was unable to fully protrude her tongue . 2. 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 Statin myopathy is also possible but less likely as the CK level was not markedly elevated.  3. 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  4. Chronic right cerebral hemisphere ischemic infarctions. The locations of the infarction, although relatively large in size, do not appear to intersect the motor tracts significantly as they predominantly involve the temporal and inferior parietal regions. However, the old stroke may explain the asymmetric left worse than right upper extremity weakness seen on exam.  5. In the  context of degenerative spondylosis, MRI of cervical and thoracic spine show no no gross cervical spinal cord signal abnormality or cord compression to explain the patient's motor symptoms and signs. MRI of lumbar spine also with spondylosis but no conus medullaris or cauda equina compression.    Recommendations: 1. An LP would be optimal to assess for possible albuminocytologic dissociation (a typical finding in GBS). Unfortunately, give her PE, holding anticoagulation for an LP would carry a greater risk of morbidity/mortality than the overall benefit associated with the diagnostic information obtained from a CSF sample.    2. Lipitor has been discontinued. On Plavix for stroke prevention and Eliquis for DVT and PE treatment. 3. As MRI of cervical, thoracic and lumbar spine are unremarkable, would initiate a 5 day course of empiric IVIG at 400 mg/kg per day, given that LP cannot be safely performed. This has been discussed with the patient who agrees to start IVIG for 5 days. 4. Will need to be on IVF and kept well-hydrated during IVIG treatment . IVIG 400 mg/kg  qd x 5 days ordered (today will be day 1/5) 5. 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    LOS: 12 days   @Electronically  signed: Dr. Caryl PinaEric Almin Livingstone@ 08/08/2019  7:31 AM

## 2019-08-08 NOTE — Progress Notes (Signed)
PROGRESS NOTE  Denise Santiago JOI:[id number] DOB: [birth year] DOA: 07/26/2019 PCP: Seward Carol, MD  HPI/Recap of past 24 hours:  She does not appear to be in acute distress, denies pain, no fever Poor oral intake remains very poor,  She is very weak    Assessment/Plan: Principal Problem:   Major depressive disorder Active Problems:   Essential hypertension   Type 2 diabetes mellitus without complication (HCC)   CVA (cerebral vascular accident) ([place])   Hyperlipidemia   Generalized weakness   Hypokalemia   Hyponatremia   Acute on chronic kidney failure (HCC)   Thrombocytopenia (HCC)   Elevated troponin   Lactic acidosis   AKI (acute kidney injury) ([place])   Palliative care by specialist   Goals of care, counseling/discussion   Xerostomia   Dry lips   Failure to thrive in adult   Physical debility   Malnutrition of moderate degree   Esophageal thrush (HCC)   Palliative care encounter   Pulmonary embolism ([place])   DVT (deep venous thrombosis) ([place])   Thrush, oral   Colitis    FTT/progressive weakness/poor oral intake with reported weight loss: -Per sister patient started have strange behavior since Christmas, she started eat less, But patient has been able to perform ADLs, even driving to work on March 15 -Patient now is very weak not able to lift lower extremity against gravity,bilateral r arm are very weak as well - repeat MRI of the brain no acute findings,  -neurology Dr. Cheral Marker consulted to assess movement disorder, possible myopathy.  -MRI spine no acute findings, per neurology Dr. 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  Patient denies any nausea or vomiting and abdominal pain.  Transition to oral Augmentin to complete the course, she already received 3 days of rocephin , 3 days of IV zosyn and plan for 4 more days of Augmentin to complete a 10 day course.   E. coli UTI -Fully treated  Urinary retention, required in and out cath last night with 500 cc urine drained Continue bladder scan, may require in and out cath versus indwelling Foley   Hypokalemia, replace K, check mag  Bilateral pulmonary embolism Patient was started on IV heparin plan to transition to oral Eliquis  Venous duplex of the lower extremities shows acute DVT involving the single left posterior tibial vein. Echocardiogram ordered showed left ventricular ejection fraction of 60 to 65% without any regional wall abnormalities.  Left ventricular diastolic parameters are consistent with grade 1 diastolic dysfunction.  No right heart strain seen.  Thrombocytopenia, acute on chronic -Platelet 88 on presentation, improving, today is 113  AKI superimposed on stage II CKD Creatinine 1.38 on presentation, today is 1. 01 Repeat BMP in the morning Monitor renal function  History of recurrent CVA -MRI brain obtained March 25 no acute CVA -Continue Plavix and statin -She is on Eliquis now due to PE  Constipation; appear resolved  Body mass index is 33.95 kg/m.   First Covid vaccine  (  [employer] )on March 13   DVT Prophylaxis: On Eliquis  Code Status: DNR  Family Communication: Sister over the phone with his permission on April 1 and on 4/3 Disposition Plan:    Patient came from:                            home                                                                               Anticipated d/c place: SNF  Barriers to d/c OR conditions which need to be met to effect a safe d/c: getting IVIG treatment Difficult placement due to lack of insurance  Consultants:  Cardiology  Psychiatry  Dentistry  Palliative care  PT/OT/social worker  Dietitian  Neurology  Procedures:  None  Antibiotics:  As described above   Objective: BP 132/63   Pulse 88   Temp 97.8 F (36.6 C) (Oral)   Resp 18   Ht 5' 2"  (1.575 m)   Wt 84.2 kg   SpO2 96%   BMI 33.95 kg/m   Intake/Output Summary (Last 24 hours) at 08/08/2019 1434 Last data filed at 08/08/2019 0617 Gross per 24 hour  Intake 1966.7 ml  Output 650 ml  Net 1316.7 ml   Filed Weights   08/06/19 0334 08/07/19 0500 08/08/19 0500  Weight: 85.3 kg 88.4 kg 84.2 kg    Exam: Patient is examined daily including today on 08/08/2019, exams remain the same as of yesterday except that has changed    General:  NAD  Cardiovascular: RRR  Respiratory: CTABL  Abdomen: Soft/ND/NT, positive BS  Musculoskeletal: No Edema  Neuro: NAD, bilateral upper extremity weakness, bilateral lower extremity weakness barely able to lift up against gravity  Data Reviewed: Basic Metabolic Panel: Recent Labs  Lab 08/03/19 0322 08/05/19 0353 08/06/19 0533 08/07/19 0508 08/08/19 0420  NA 143 139 142 141 143  K 3.6 3.1* 4.7 3.5 3.7  CL 109 109 110 106 110  CO2 23 21* 23 23 24   GLUCOSE 105* 129* 126* 119* 105*  BUN 11 12 14 11 12   CREATININE 1.18* 1.19* 1.09* 1.06* 1.01*  CALCIUM 8.7* 8.6* 8.9 8.9 8.7*  MG 1.8  --  2.0  --  2.0  PHOS 2.5  --   --   --   --    Liver Function Tests: Recent Labs  Lab 08/06/19 0533 08/07/19 0508 08/08/19 0420  AST 72* 84* 64*  ALT 52* 73* 66*  ALKPHOS 54 63 60  BILITOT 1.1 0.5 0.8  PROT 5.1* 5.7* 5.4*  ALBUMIN 2.5* 2.6* 2.5*   No results for input(s): LIPASE, AMYLASE in the last 168 hours. Recent Labs  Lab 08/08/19 0420  AMMONIA 20    CBC: Recent Labs  Lab 08/02/19 0108 08/03/19 0322 08/04/19 0229 08/05/19 0353 08/07/19 0508  WBC 4.9 5.0 4.9 4.3 5.3  HGB 14.1 14.6 13.8 13.5 12.8  HCT 41.1 42.5 40.6 40.1 38.1  MCV 88.8 88.4 88.5 89.3 90.1  PLT 105* 101* 100* 100* 113*   Cardiac Enzymes:   Recent Labs  Lab 08/06/19 0533 08/07/19 0508 08/08/19 0420  CKTOTAL 308* 185  206   BNP (last 3 results) Recent Labs    07/26/19 0810  BNP 64.2    ProBNP (last 3 results) No results for input(s): PROBNP in the last 8760 hours.  CBG: Recent Labs  Lab 08/07/19 1143 08/07/19 1532 08/07/19 2112 08/08/19 0605 08/08/19 1114  GLUCAP 94 99 83 99 94    No results found for this or any previous visit (from the past 240 hour(s)).   Studies: No results found.  Scheduled Meds: . amoxicillin-clavulanate  500 mg Oral Q12H  . antiseptic oral rinse  15 mL Mouth Rinse BID  . apixaban  10 mg Oral BID   Followed by  . [START ON 08/11/2019] apixaban  5 mg Oral BID  . clopidogrel  75 mg Oral Daily  . feeding supplement (ENSURE ENLIVE)  237 mL Oral QID  . hydrALAZINE  25 mg Oral Q8H  . insulin aspart  0-15 Units Subcutaneous TID WC  . insulin aspart  0-5 Units Subcutaneous QHS  . latanoprost  1 drop Both Eyes QHS  . lisinopril  20 mg Oral Daily  . nicotine  21 mg Transdermal Daily  . nystatin  5 mL Oral QID  . sodium chloride flush  3 mL Intravenous Once  . traZODone  100 mg Oral QHS    Continuous Infusions: . fluconazole (DIFLUCAN) IV 100 mg (08/07/19 2158)  . Immune Globulin 10% 35 g (08/08/19 1245)  . lactated ringers with kcl 50 mL/hr at 08/07/19 1644     Time spent: 77mns,  I have personally reviewed and interpreted on  08/08/2019 daily labs, tele strips, imagings as discussed above under date review session and assessment and plans.  I reviewed all nursing notes, pharmacy notes, consultant notes,  vitals, pertinent old records  I have discussed plan of care as described above with RN , patient at  bedside and sister over the phone on 08/08/2019   FFlorencia ReasonsMD, PhD, FACP  [facility]  Available via Epic secure chat 7am-7pm for nonurgent issues Please page for urgent issues, pager number available through [email or web address] .   08/08/2019, 2:34 PM  LOS: 12 days

## 2019-08-09 LAB — GLUCOSE, CAPILLARY
Glucose-Capillary: 112 mg/dL — ABNORMAL HIGH (ref 70–99)
Glucose-Capillary: 143 mg/dL — ABNORMAL HIGH (ref 70–99)
Glucose-Capillary: 85 mg/dL (ref 70–99)
Glucose-Capillary: 89 mg/dL (ref 70–99)

## 2019-08-09 NOTE — Progress Notes (Signed)
Patient retaining urine. Patient has had 3 I/O catheterizations. MD Notified and instructed RN to place foley catheter. Patient educated.Will continue to monitor. Melony Overly, RN

## 2019-08-09 NOTE — Progress Notes (Addendum)
PROGRESS NOTE  Denise Santiago ZOX:[id number] DOB: [birth year] DOA: 07/26/2019 PCP: Seward Carol, MD   Brief summary:  Chief Complaint: Generalized weakness Denise Santiago is a 64 y.o. female with medical history significant of hypertension, diabetes mellitus, hyperlipidemia, CVA on 12/25/2018 and 03/27/2019 (has loop recorder), tobacco abuse presents to emergency department due to generalized weakness.  Patient tells me that she received Covid vaccine last Saturday and since then she has been feeling weak, lethargic, has no energy and has decreased appetite.  Yesterday  she slid out of her chair onto the floor and was not able to get up due to generalized weakness.  She stayed on the floor all night long.  Her niece was with her at that time but she could not get her up as well.  EMS was called this morning and brought here to the emergency department for further evaluation and management.  Patient denies headache, blurry vision, slurred speech, head trauma, loss of consciousness, seizures, chest pain, shortness of breath, palpitation, leg swelling, fever, chills, nausea, vomiting, diarrhea, cough, congestion, wheezing, dysuria or any other urinary symptoms.  She lives alone and uses walker and cane for ambulation.  She continues to smoke 1 pack of cigarettes per day however denies alcohol, recent drug use.  As per the sister, patient has lost more than 50 pounds since November after her last stroke. On further questioning, pt reports that she has altered taste and no appetite whatso ever, along with loose teeth , preventing her to eat any solid food. Malignancy work up has been initiated and CT abd , pelvis and chest with contrast ordered  On 3/23fr further evaluation.  CT of the chest with contrast showed acute bilateral pulmonary embolism and CT of the abdomen showed ascending colon colitis.   Per sister patient started have strange behavior since Christmas, she started to eat less,  But patient has been able to perform ADLs, even driving to work on March 15.  She received her first Covid vaccine on March 13, second dose scheduled on April 7     HPI/Recap of past 24 hours:   She is  very drowsy, very weak She does not eat much at all   Assessment/Plan: Principal Problem:   Major depressive disorder Active Problems:   Essential hypertension   Type 2 diabetes mellitus without complication (HCC)   [place]) [place]   Hyperlipidemia   Generalized weakness   Hypokalemia   Hyponatremia   Acute on chronic kidney failure (HCC)   Thrombocytopenia (HCC)   Elevated troponin   Lactic acidosis   [place]   Palliative care by specialist   Goals of care, counseling/discussion   Xerostomia   Dry lips   Failure to thrive in adult   Physical debility   Malnutrition of moderate degree   Esophageal thrush (HCC)   Palliative care encounter   Pulmonary embolism (HCC)   [place]   Thrush, oral   Colitis    FTT/progressive weakness/poor oral intake with reported weight loss: -Per sister patient started have strange behavior since Christmas, she started eat less, But patient has been able to perform ADLs, even driving to work on March 15 -Patient now is very weak not able to lift lower extremity against gravity,bilateral r arm are very weak as well - repeat MRI of the brain no acute findings,  -neurology Dr. LCheral Markerconsulted to assess movement disorder, possible myopathy.  -MRI spine no acute findings, per  neurology Dr. 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, statin discontinued per neurology  recommendation    Poor appetite: (Sister report patient started having appetite issues since July 2020) -Calorie count by nutrition -start Remeron for appetite stimulant, continue nutrition supplement - esophagram and orthopantogram ordered on March 27th, It does not appear to have done, presumably due to lethargy and does not take oral meds reliably  -If she has no improvement with IVIG treatment, may have to consider nutrition supplement per tube  Ascending colon colitis, incidental finding on CT scan during malignancy work-up -Patient denies any nausea or vomiting and abdominal pain. -Blood culture no growth  -Treated with 10 days of antibiotics including Rocephin , Zosyn then oral Augmentin . - She is also started on Diflucan since March 29, DC Diflucan on April 4.  Bilateral pulmonary embolism(incidental finding on ct chest during malignancy work-up) Patient was started on IV heparin plan to transition to oral Eliquis  Venous duplex of the lower extremities shows acute DVT involving the single left posterior tibial vein. Echocardiogram ordered showed left ventricular ejection fraction of 60 to 65% without any regional wall abnormalities.  Left ventricular diastolic parameters are consistent with grade 1 diastolic dysfunction.  No right heart strain seen.  Thrombocytopenia, acute on chronic -Platelet 88 on presentation, improving -Possibly due to PE and DVT  E. coli UTI -Fully treated  Urinary retention, Foley inserted on April 3  AKI superimposed on stage II CKD Creatinine 1.38 on presentation, today is 1. 01 Repeat BMP in the morning Monitor renal function  Hypokalemia remain low continue to replace K,  Mag 2.0   History of recurrent CVA -CVA on 12/25/2018 and 03/27/2019 (has loop recorder), -MRI brain obtained March 25 no acute CVA -Continue Plavix and statin -She is on Eliquis now due to PE  Constipation; appear resolved  Body mass index is 33.95 kg/m.    First Covid vaccine  [employer] )on March 13   DVT Prophylaxis: On Eliquis  Code Status: DNR  Family Communication: Sister over the phone with his permission on April 1 and on 4/3 Disposition Plan:    Patient came from:                            home                                                                              Anticipated d/c place: SNF  Barriers to d/c OR conditions which need to be met to effect a safe d/c: getting IVIG treatment, poor oral intake Difficult placement due to lack of insurance  Consultants:  Cardiology  Psychiatry  Dentistry  Palliative care  PT/OT/social worker  Dietitian  Neurology  Procedures:  None  Antibiotics:  As described above   Objective: BP (!) 152/75 (BP Location: Left Arm)   Pulse 93   Temp 98.2 F (36.8 C) (Axillary)  Resp 20   Ht 5' 2"  (1.575 m)   Wt 84.2 kg   SpO2 100%   BMI 33.95 kg/m   Intake/Output Summary (Last 24 hours) at 08/09/2019 1152 Last data filed at 08/09/2019 0600 Gross per 24 hour  Intake 2176.42 ml  Output 400 ml  Net 1776.42 ml   Filed Weights   08/06/19 0334 08/07/19 0500 08/08/19 0500  Weight: 85.3 kg 88.4 kg 84.2 kg    Exam: Patient is examined daily including today on 08/09/2019, exams remain the same as of yesterday except that has changed    General: Very drowsy,   Cardiovascular: RRR  Respiratory: CTABL  Abdomen: Soft/ND/NT, positive BS  Musculoskeletal: No Edema  Neuro: NAD, bilateral upper extremity weakness, bilateral lower extremity weakness barely able to lift up against gravity  Data Reviewed: Basic Metabolic Panel: Recent Labs  Lab 08/03/19 0322 08/05/19 0353 08/06/19 0533 08/07/19 0508 08/08/19 0420  NA 143 139 142 141 143  K 3.6 3.1* 4.7 3.5 3.7  CL 109 109 110 106 110  CO2 23 21* 23 23 24   GLUCOSE 105* 129* 126* 119* 105*  BUN 11 12 14 11 12   CREATININE 1.18* 1.19* 1.09* 1.06* 1.01*  CALCIUM 8.7* 8.6* 8.9 8.9 8.7*  MG 1.8  --  2.0  --  2.0   PHOS 2.5  --   --   --   --    Liver Function Tests: Recent Labs  Lab 08/06/19 0533 08/07/19 0508 08/08/19 0420  AST 72* 84* 64*  ALT 52* 73* 66*  ALKPHOS 54 63 60  BILITOT 1.1 0.5 0.8  PROT 5.1* 5.7* 5.4*  ALBUMIN 2.5* 2.6* 2.5*   No results for input(s): LIPASE, AMYLASE in the last 168 hours. Recent Labs  Lab 08/08/19 0420  AMMONIA 20   CBC: Recent Labs  Lab 08/03/19 0322 08/04/19 0229 08/05/19 0353 08/07/19 0508  WBC 5.0 4.9 4.3 5.3  HGB 14.6 13.8 13.5 12.8  HCT 42.5 40.6 40.1 38.1  MCV 88.4 88.5 89.3 90.1  PLT 101* 100* 100* 113*   Cardiac Enzymes:   Recent Labs  Lab 08/06/19 0533 08/07/19 0508 08/08/19 0420  CKTOTAL 308* 185 206   BNP (last 3 results) Recent Labs    07/26/19 0810  BNP 64.2    ProBNP (last 3 results) No results for input(s): PROBNP in the last 8760 hours.  CBG: Recent Labs  Lab 08/08/19 1114 08/08/19 1633 08/08/19 2134 08/09/19 0611 08/09/19 1118  GLUCAP 94 89 86 89 85    No results found for this or any previous visit (from the past 240 hour(s)).   Studies: No results found.  Scheduled Meds: . antiseptic oral rinse  15 mL Mouth Rinse BID  . apixaban  10 mg Oral BID   Followed by  . [START ON 08/11/2019] apixaban  5 mg Oral BID  . Chlorhexidine Gluconate Cloth  6 each Topical Daily  . clopidogrel  75 mg Oral Daily  . feeding supplement (ENSURE ENLIVE)  237 mL Oral QID  . hydrALAZINE  25 mg Oral Q8H  . insulin aspart  0-15 Units Subcutaneous TID WC  . insulin aspart  0-5 Units Subcutaneous QHS  . latanoprost  1 drop Both Eyes QHS  . lisinopril  20 mg Oral Daily  . mirtazapine  7.5 mg Oral QHS  . nicotine  21 mg Transdermal Daily  . nystatin  5 mL Oral QID  . sodium chloride flush  3 mL Intravenous Once    Continuous  Infusions: . fluconazole (DIFLUCAN) IV 100 mg (08/08/19 2138)  . Immune Globulin 10% 35 g (08/08/19 1245)  . lactated ringers with kcl 50 mL/hr at 08/08/19 1648     Time spent: 67mns,  I  have personally reviewed and interpreted on  08/09/2019 daily labs, tele strips, imagings as discussed above under date review session and assessment and plans.  I reviewed all nursing notes, pharmacy notes, consultant notes,  vitals, pertinent old records  I have discussed plan of care as described above with RN , patient  on 08/09/2019   FFlorencia ReasonsMD, PhD, FACP  [facility]  Available via Epic secure chat 7am-7pm for nonurgent issues Please page for urgent issues, pager number available through [email or web address] .   08/09/2019, 11:52 AM  LOS: 13 days

## 2019-08-10 DIAGNOSIS — E785 Hyperlipidemia, unspecified: Secondary | ICD-10-CM

## 2019-08-10 DIAGNOSIS — E871 Hypo-osmolality and hyponatremia: Secondary | ICD-10-CM

## 2019-08-10 DIAGNOSIS — E876 Hypokalemia: Secondary | ICD-10-CM

## 2019-08-10 DIAGNOSIS — I2699 Other pulmonary embolism without acute cor pulmonale: Secondary | ICD-10-CM

## 2019-08-10 DIAGNOSIS — E872 Acidosis: Secondary | ICD-10-CM

## 2019-08-10 LAB — COMPREHENSIVE METABOLIC PANEL
ALT: 52 U/L — ABNORMAL HIGH (ref 0–44)
AST: 48 U/L — ABNORMAL HIGH (ref 15–41)
Albumin: 2.2 g/dL — ABNORMAL LOW (ref 3.5–5.0)
Alkaline Phosphatase: 54 U/L (ref 38–126)
Anion gap: 8 (ref 5–15)
BUN: 10 mg/dL (ref 8–23)
CO2: 25 mmol/L (ref 22–32)
Calcium: 8.5 mg/dL — ABNORMAL LOW (ref 8.9–10.3)
Chloride: 105 mmol/L (ref 98–111)
Creatinine, Ser: 0.91 mg/dL (ref 0.44–1.00)
GFR calc Af Amer: >60 mL/min (ref >60)
GFR calc non Af Amer: >60 mL/min (ref >60)
Glucose, Bld: 119 mg/dL — ABNORMAL HIGH (ref 70–99)
Potassium: 3.5 mmol/L (ref 3.5–5.1)
Sodium: 138 mmol/L (ref 135–145)
Total Bilirubin: 0.5 mg/dL (ref 0.3–1.2)
Total Protein: 6.8 g/dL (ref 6.5–8.1)

## 2019-08-10 LAB — GLUCOSE, CAPILLARY
Glucose-Capillary: 108 mg/dL — ABNORMAL HIGH (ref 70–99)
Glucose-Capillary: 110 mg/dL — ABNORMAL HIGH (ref 70–99)
Glucose-Capillary: 94 mg/dL (ref 70–99)
Glucose-Capillary: 100 mg/dL — ABNORMAL HIGH (ref 70–99)

## 2019-08-10 LAB — ANTINUCLEAR ANTIBODIES, IFA: ANA Ab, IFA: NEGATIVE

## 2019-08-10 LAB — CBC
HCT: 33.7 % — ABNORMAL LOW (ref 36.0–46.0)
Hemoglobin: 11.4 g/dL — ABNORMAL LOW (ref 12.0–15.0)
MCH: 30.6 pg (ref 26.0–34.0)
MCHC: 33.8 g/dL (ref 30.0–36.0)
MCV: 90.3 fL (ref 80.0–100.0)
Platelets: 109 10*3/uL — ABNORMAL LOW (ref 150–400)
RBC: 3.73 MIL/uL — ABNORMAL LOW (ref 3.87–5.11)
RDW: 14.3 % (ref 11.5–15.5)
WBC: 5.2 10*3/uL (ref 4.0–10.5)
nRBC: 0 % (ref 0.0–0.2)

## 2019-08-10 LAB — CK: Total CK: 123 U/L (ref 38–234)

## 2019-08-10 MED ORDER — ENSURE ENLIVE PO LIQD
237.0000 mL | Freq: Four times a day (QID) | ORAL | Status: DC
  Administered 2019-08-10 – 2019-08-17 (×23): 237 mL via ORAL
  Filled 2019-08-10 (×5): qty 237

## 2019-08-10 NOTE — Plan of Care (Signed)
  Problem: Clinical Measurements: Goal: Cardiovascular complication will be avoided Outcome: Progressing   Problem: Clinical Measurements: Goal: Respiratory complications will improve Outcome: Progressing   Problem: Clinical Measurements: Goal: Diagnostic test results will improve Outcome: Progressing   

## 2019-08-10 NOTE — Progress Notes (Signed)
PROGRESS NOTE  Denise Santiago QQV:[id number] DOB: 1955/09/03 DOA: 07/26/2019 PCP: Renford Dills, MD  Brief History   Denise Hamiltonis a 64 y.o.femalewith medical history significant ofhypertension, diabetes mellitus, hyperlipidemia, CVA on 12/25/2018 and 03/27/2019(has loop recorder), tobacco abuse presents to emergency department due to generalized weakness.  Patient tells me that she received Covid vaccine last Saturday and since then she has been feeling weak, lethargic, has no energy and has decreased appetite. Yesterday she slid out of her chair onto the floor and was not able to get up due to generalized weakness. She stayed on the floor all night long. Her niece was with her at that time but she could not get her up as well. EMS was called this morning and brought here to the emergency department for further evaluation and management.  Patient denies headache, blurry vision, slurred speech, head trauma, loss of consciousness, seizures, chest pain, shortness of breath, palpitation, leg swelling, fever, chills, nausea, vomiting, diarrhea, cough, congestion, wheezing, dysuria or any other urinary symptoms.  She lives alone and uses walker and cane for ambulation. She continues to smoke 1 pack of cigarettes per day however denies alcohol, recent drug use.  As per the sister, patient has lost more than 50 pounds since November after her last stroke.On further questioning, pt reports that she has altered taste and no appetite whatso ever, along with loose teeth , preventing her to eat any solid food. Malignancy work up has been initiated and CT abd , pelvis and chest with contrast ordered  On 3/27forfurther evaluation. CT of the chest with contrast showed acute bilateral pulmonary embolism and CT of the abdomen showed ascending colon colitis.   Per sister patient started have strange behavior since Christmas, she started to eat less, But patient has been able to perform ADLs,  even driving to work on March 15.  The patient has been very drowsy and weak. She has a very poor PO intake.   She received her first Covid vaccine on March 13, second dose scheduled on April 7.  Consultants  . Neurology . Dentistry . Palliative care . Psychiatry . Cardiology  Procedures  . None  Antibiotics   Anti-infectives (From admission, onward)   Start     Dose/Rate Route Frequency Ordered Stop   08/04/19 1015  amoxicillin-clavulanate (AUGMENTIN) 250-62.5 MG/5ML suspension 500 mg     500 mg Oral Every 12 hours 08/04/19 0844 08/08/19 2139   08/03/19 2000  fluconazole (DIFLUCAN) IVPB 100 mg  Status:  Discontinued     100 mg 50 mL/hr over 60 Minutes Intravenous Every 24 hours 08/03/19 1802 08/09/19 1813   08/01/19 1715  piperacillin-tazobactam (ZOSYN) IVPB 3.375 g  Status:  Discontinued     3.375 g 12.5 mL/hr over 240 Minutes Intravenous Every 8 hours 08/01/19 1712 08/04/19 0844   07/30/19 0000  cefTRIAXone (ROCEPHIN) 1 g in sodium chloride 0.9 % 100 mL IVPB  Status:  Discontinued     1 g 200 mL/hr over 30 Minutes Intravenous Every 24 hours 07/29/19 1420 07/29/19 1421   07/29/19 1421  cefTRIAXone (ROCEPHIN) 1 g in sodium chloride 0.9 % 100 mL IVPB  Status:  Discontinued     1 g 200 mL/hr over 30 Minutes Intravenous Every 24 hours 07/29/19 1421 08/01/19 0716    .   Subjective  The patient is resting comfortably. No acute distress.   Objective   Vitals:  Vitals:   08/10/19 1400 08/10/19 1710  BP: (!) 153/69 (!) 144/78  Pulse:  Marland Kitchen)  102  Resp:  18  Temp:  98.2 F (36.8 C)  SpO2: 98% 98%   Exam:  Constitutional:  . The patient is awake, alert, and oriented x 3. No acute distress. Respiratory:  . No increased work of breathing. . No wheezes, rales, or rhonchi . No tactile fremitus Cardiovascular:  . Regular rate and rhythm . No murmurs, ectopy, or gallups. . No lateral PMI. No thrills. Abdomen:  . Abdomen is soft, non-tender, non-distended . No hernias,  masses, or organomegaly . Normoactive bowel sounds.  Musculoskeletal:  . No cyanosis, clubbing, or edema Skin:  . No rashes, lesions, ulcers . palpation of skin: no induration or nodules Neurologic:  . CN 2-12 intact . Sensation all 4 extremities intact . Bilateral upper and lower extremity weakness Psychiatric:  . The patient is unable to cooperate with exam.  I have personally reviewed the following:   Today's Data  . Vitals, CMP, CBC  Micro Data  . UCx (3/22) + for E. coli  Scheduled Meds: . antiseptic oral rinse  15 mL Mouth Rinse BID  . apixaban  10 mg Oral BID   Followed by  . [START ON 08/11/2019] apixaban  5 mg Oral BID  . Chlorhexidine Gluconate Cloth  6 each Topical Daily  . clopidogrel  75 mg Oral Daily  . feeding supplement (ENSURE ENLIVE)  237 mL Oral QID  . hydrALAZINE  25 mg Oral Q8H  . insulin aspart  0-15 Units Subcutaneous TID WC  . insulin aspart  0-5 Units Subcutaneous QHS  . latanoprost  1 drop Both Eyes QHS  . lisinopril  20 mg Oral Daily  . mirtazapine  7.5 mg Oral QHS  . nicotine  21 mg Transdermal Daily  . nystatin  5 mL Oral QID  . sodium chloride flush  3 mL Intravenous Once   Continuous Infusions: . Immune Globulin 10% Stopped (08/10/19 1402)  . lactated ringers with kcl 50 mL/hr at 08/10/19 1104    Principal Problem:   Major depressive disorder Active Problems:   Essential hypertension   Type 2 diabetes mellitus without complication (HCC)   CVA (cerebral vascular accident) ([place])   Hyperlipidemia   Generalized weakness   Hypokalemia   Hyponatremia   Acute on chronic kidney failure (HCC)   Thrombocytopenia (HCC)   Elevated troponin   Lactic acidosis   AKI (acute kidney injury) ([place])   Palliative care by specialist   Goals of care, counseling/discussion   Xerostomia   Dry lips   Failure to thrive in adult   Physical debility   Malnutrition of moderate degree   Esophageal thrush (HCC)   Palliative care encounter   Pulmonary  embolism (HCC)   DVT (deep venous thrombosis) ([place])   Thrush, oral   Colitis   LOS: 14 days   A & P  FTT/progressive weakness/poor oral intake with reported weight loss: -Per sister patient started have strange behavior since Christmas, she started eat less, But patient has been able to perform ADLs, even driving to work on March 15 -Patient now is very weak not able to lift lower extremity against gravity,bilateral r arm are very weak as well - repeat MRI of the brain no acute findings,  -neurology Dr. Cheral Marker consulted to assess movement disorder, possible myopathy.  -MRI spine no acute findings, per neurology Dr. 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, statin discontinued per neurology recommendation    Poor appetite: (Sister report patient started having appetite issues since July 2020) -Calorie count by nutrition -start Remeron for appetite stimulant, continue nutrition supplement - esophagram and orthopantogram ordered on March 27th, It does not appear to have done, presumably due to lethargy and does not take oral meds reliably  -If she has no improvement with IVIG treatment, may have to consider nutrition supplement per tube  Ascending colon colitis, incidental finding on CT scan during malignancy work-up -Patient denies any nausea or vomiting and abdominal pain. -Blood culture no growth  -Treated with 10 days of antibiotics including Rocephin , Zosyn then oral Augmentin . -She is also started on Diflucan since March 29, DC Diflucan on April 4.  Bilateral pulmonary embolism(incidental finding on ct chest during malignancy work-up) Patient was started on IV heparin plan to  transition to oral Eliquis Venous duplex of the lower extremities shows acute DVT involving the single left posterior tibial vein. Echocardiogram ordered showed left ventricular ejection fraction of 60 to 65% without any regional wall abnormalities. Left ventricular diastolic parameters are consistent with grade 1 diastolic dysfunction. No right heart strain seen.  Thrombocytopenia, acute on chronic -Platelet 88 on presentation, improving -Possibly due to PE and DVT  E. coli UTI -Fully treated  Urinary retention, Foley inserted on April 3  AKI superimposed on stage II CKD Creatinine 1.38 on presentation, today is 1. 01 Repeat BMP in the morning Monitor renal function  Hypokalemia remain low continue to replace K,  Mag 2.0   History of recurrent CVA -CVA on 12/25/2018 and 03/27/2019(has loop recorder), -MRI brain obtained March 25 no acute CVA -Continue Plavix and statin -She is on Eliquis now due to PE  Constipation; appear resolved  Body mass index is 33.95 kg/m.   First Covid vaccine  [employer] )on March 13  The patient has been seen and examined by me. I have spent 32 minutes in her evaluation and care.  DVT Prophylaxis: On Eliquis Code Status: DNR Family Communication: Sister over the phone with his permission on April 1 and on 4/3 Disposition Plan: The patient came from home. Anticipate discharge to SNF. Barriers to dc include patient is currently receiving IVIG, poor oral intake, and difficult to place due to lack of insurance.   Delmus Warwick, DO [facility] Direct contact: see [email or web address]  7PM-7AM contact night coverage as above 08/10/2019, 6:38 PM  LOS: 14 days

## 2019-08-10 NOTE — Progress Notes (Signed)
Physical Therapy Treatment Patient Details Name: Denise Santiago MRN: [id number] DOB: December 06, 1955 Today's Date: 08/10/2019    History of Present Illness 64 y.o. female with a hx of DM, HTN, HLD, CVA 12/2018 & 03/2019, ongoing tob use who presented to ED with generalized weakness and elevated troponin, thought to be non specific by cardiology. pt reports weakness after COVID vaccine 07/16/19. The day prior to admission pt slid out of chair to floor and remained through the night.     PT Comments    Patient not progressing towards PT goals. Pt lethargic this session limiting mobility. Worked on LE there ex in supine including core/trunk with bridging and half bridging to assist with rolling. Noted to have ataxia RUE and difficulty activating LUE. Fatigues quickly. Recommend nursing use maxi move for transfer OOB to chair. Pt noted to have a decline in function over the last week. Continue to recommend SNF. Goals downgraded. Will follow.   Follow Up Recommendations  Supervision/Assistance - 24 hour;SNF     Equipment Recommendations  None recommended by PT    Recommendations for Other Services       Precautions / Restrictions Precautions Precautions: Fall Precaution Comments: reports falls since COVID vaccine Restrictions Weight Bearing Restrictions: No    Mobility  Bed Mobility Overal bed mobility: Needs Assistance Bed Mobility: Rolling Rolling: Max assist         General bed mobility comments: Rolling to left/right with max A and cues to reach for rail; ataxia in RUE. Difficulty moving LUE.  Transfers                 General transfer comment: Deferred  Ambulation/Gait                 Stairs             Wheelchair Mobility    Modified Rankin (Stroke Patients Only) Modified Rankin (Stroke Patients Only) Pre-Morbid Rankin Score: No significant disability Modified Rankin: Severe disability     Balance                                             Cognition Arousal/Alertness: Lethargic Behavior During Therapy: Flat affect Overall Cognitive Status: No family/caregiver present to determine baseline cognitive functioning Area of Impairment: Attention;Memory;Orientation;Following commands;Safety/judgement;Awareness;Problem solving                 Orientation Level: Disoriented to;Time;Situation;Place Current Attention Level: Sustained   Following Commands: Follows one step commands with increased time;Follows one step commands inconsistently Safety/Judgement: Decreased awareness of safety;Decreased awareness of deficits Awareness: Intellectual Problem Solving: Slow processing;Decreased initiation;Difficulty sequencing;Requires verbal cues General Comments: Pt lethargic during session; poor attention. needs constant cues to stay attended to task with repetition.      Exercises General Exercises - Lower Extremity Heel Slides: AAROM;Both;10 reps;Supine Hip ABduction/ADduction: AAROM;Both;10 reps;Supine Low Level/ICU Exercises Stabilized Bridging: AAROM;Both;5 reps;Supine Other Exercises Other Exercises: half bridge to assist with rolling to right/left stabilizing foot x5 BLEs each direction Other Exercises: Initiation of core/trunk with UEs reaching to right/left across body and attempting to lift trunk off bed with assist x2 both directions. Other Exercises: bridging x7 stabilizing bil feet and LEs    General Comments        Pertinent Vitals/Pain Pain Assessment: Faces Faces Pain Scale: No hurt    Home Living  Prior Function            PT Goals (current goals can now be found in the care plan section) Acute Rehab PT Goals Patient Stated Goal: none stated PT Goal Formulation: With patient Time For Goal Achievement: 08/24/19 Potential to Achieve Goals: Fair Progress towards PT goals: Not progressing toward goals - comment;Goals downgraded-see care plan     Frequency    Min 3X/week      PT Plan Current plan remains appropriate    Co-evaluation              AM-PAC PT "6 Clicks" Mobility   Outcome Measure  Help needed turning from your back to your side while in a flat bed without using bedrails?: Total Help needed moving from lying on your back to sitting on the side of a flat bed without using bedrails?: Total Help needed moving to and from a bed to a chair (including a wheelchair)?: Total Help needed standing up from a chair using your arms (e.g., wheelchair or bedside chair)?: Total Help needed to walk in hospital room?: Total Help needed climbing 3-5 steps with a railing? : Total 6 Click Score: 6    End of Session   Activity Tolerance: Patient limited by lethargy Patient left: in bed;with call bell/phone within reach;with bed alarm set Nurse Communication: Mobility status;Need for lift equipment PT Visit Diagnosis: Muscle weakness (generalized) (M62.81);Difficulty in walking, not elsewhere classified (R26.2)     Time: 8841-6606 PT Time Calculation (min) (ACUTE ONLY): 17 min  Charges:  $Therapeutic Exercise: 8-22 mins                     Marisa Severin, PT, DPT Acute Rehabilitation Services Pager [phone] Office [phone]       Denise Santiago 08/10/2019, 1:50 PM

## 2019-08-10 NOTE — Progress Notes (Signed)
Nutrition Follow-up  DOCUMENTATION CODES:   Non-severe (moderate) malnutrition in context of chronic illness  INTERVENTION:   Continue 1:1 feeding assistance  Ensure Enlive po QID, each supplement provides 350 kcal and 20 grams of protein  D/c Magic cup TID with meals, each supplement provides 290 kcal and 9 grams of protein  NUTRITION DIAGNOSIS:   Moderate Malnutrition related to chronic illness(stroke) as evidenced by energy intake < 75% for > or equal to 1 month, percent weight loss, edema, mild fat depletion, mild muscle depletion.  Ongoing.  GOAL:   Patient will meet greater than or equal to 90% of their needs  Progressing.   MONITOR:   PO intake, Supplement acceptance, Weight trends, Skin, Labs  REASON FOR ASSESSMENT:   Consult Calorie Count  ASSESSMENT:   Pt with a PMH significant for HTN, DM, HLD, CVA presented to ED with generalized weakness and fall.  Discussed pt with RN.  Pt with poor appetite, but states she enjoys the Ensure. Pt reports drinking all 4 Ensures. Pt states she dislikes Magic cup.   Per MD, feeding tube may be considered if pt has no improvement with IVIG treatment.   PO Intake: 0-20% x last 8 recorded meals (8% average intake)  Medications reviewed and include: Novolog, Remeron, Ensure Enlive QID  Labs reviewed: CBGs 85-143  I/O: +11,873.82ml since admit  Admit wt: 83.1 kg Current wt: 84.2 kg   Diet Order:   Diet Order            DIET - DYS 1 Room service appropriate? Yes; Fluid consistency: Thin  Diet effective now              EDUCATION NEEDS:   Not appropriate for education at this time  Skin:  Skin Assessment: Skin Integrity Issues: Skin Integrity Issues:: Other (Comment) Other: non-pressure wound buttocks  Last BM:  08/09/19  Height:   Ht Readings from Last 1 Encounters:  08/01/19 5\' 2"  (1.575 m)    Weight:   Wt Readings from Last 1 Encounters:  08/08/19 84.2 kg    BMI:  Body mass index is 33.95  kg/m.  Estimated Nutritional Needs:   Kcal:  1750-1950  Protein:  110-125 grams  Fluid:  >1.75 L    10/08/19, MS, RD, LDN RD pager number and weekend/on-call pager number located in Amion.

## 2019-08-11 LAB — BASIC METABOLIC PANEL
Anion gap: 9 (ref 5–15)
BUN: 7 mg/dL — ABNORMAL LOW (ref 8–23)
CO2: 22 mmol/L (ref 22–32)
Calcium: 8.6 mg/dL — ABNORMAL LOW (ref 8.9–10.3)
Chloride: 107 mmol/L (ref 98–111)
Creatinine, Ser: 0.85 mg/dL (ref 0.44–1.00)
GFR calc Af Amer: >60 mL/min (ref >60)
GFR calc non Af Amer: >60 mL/min (ref >60)
Glucose, Bld: 101 mg/dL — ABNORMAL HIGH (ref 70–99)
Potassium: 3.5 mmol/L (ref 3.5–5.1)
Sodium: 138 mmol/L (ref 135–145)

## 2019-08-11 LAB — GLUCOSE, CAPILLARY
Glucose-Capillary: 112 mg/dL — ABNORMAL HIGH (ref 70–99)
Glucose-Capillary: 104 mg/dL — ABNORMAL HIGH (ref 70–99)
Glucose-Capillary: 90 mg/dL (ref 70–99)
Glucose-Capillary: 84 mg/dL (ref 70–99)
Glucose-Capillary: 47 mg/dL — ABNORMAL LOW (ref 70–99)
Glucose-Capillary: 154 mg/dL — ABNORMAL HIGH (ref 70–99)

## 2019-08-11 LAB — CBC WITH DIFFERENTIAL/PLATELET
Abs Immature Granulocytes: 0.11 10*3/uL — ABNORMAL HIGH (ref 0.00–0.07)
Basophils Absolute: 0 10*3/uL (ref 0.0–0.1)
Basophils Relative: 1 %
Eosinophils Absolute: 0 10*3/uL (ref 0.0–0.5)
Eosinophils Relative: 0 %
HCT: 35.9 % — ABNORMAL LOW (ref 36.0–46.0)
Hemoglobin: 12.4 g/dL (ref 12.0–15.0)
Immature Granulocytes: 2 %
Lymphocytes Relative: 18 %
Lymphs Abs: 0.9 10*3/uL (ref 0.7–4.0)
MCH: 30.2 pg (ref 26.0–34.0)
MCHC: 34.5 g/dL (ref 30.0–36.0)
MCV: 87.6 fL (ref 80.0–100.0)
Monocytes Absolute: 0.6 10*3/uL (ref 0.1–1.0)
Monocytes Relative: 11 %
Neutro Abs: 3.5 10*3/uL (ref 1.7–7.7)
Neutrophils Relative %: 68 %
Platelets: 95 10*3/uL — ABNORMAL LOW (ref 150–400)
RBC: 4.1 MIL/uL (ref 3.87–5.11)
RDW: 14.1 % (ref 11.5–15.5)
Smear Review: ADEQUATE
WBC: 5.2 10*3/uL (ref 4.0–10.5)
nRBC: 0 % (ref 0.0–0.2)

## 2019-08-11 MED ORDER — DEXTROSE 50 % IV SOLN
INTRAVENOUS | Status: AC
  Administered 2019-08-11: 25 mL
  Filled 2019-08-11: qty 50

## 2019-08-11 NOTE — Progress Notes (Signed)
PROGRESS NOTE  Denise Santiago NLG:[id number] DOB: April 29, 1956 DOA: 07/26/2019 PCP: Renford Dills, MD  Brief History   Denise Hamiltonis a 64 y.o.femalewith medical history significant ofhypertension, diabetes mellitus, hyperlipidemia, CVA on 12/25/2018 and 03/27/2019(has loop recorder), tobacco abuse presents to emergency department due to generalized weakness.  Patient tells me that she received Covid vaccine last Saturday and since then she has been feeling weak, lethargic, has no energy and has decreased appetite. Yesterday she slid out of her chair onto the floor and was not able to get up due to generalized weakness. She stayed on the floor all night long. Her niece was with her at that time but she could not get her up as well. EMS was called this morning and brought here to the emergency department for further evaluation and management.  Patient denies headache, blurry vision, slurred speech, head trauma, loss of consciousness, seizures, chest pain, shortness of breath, palpitation, leg swelling, fever, chills, nausea, vomiting, diarrhea, cough, congestion, wheezing, dysuria or any other urinary symptoms.  She lives alone and uses walker and cane for ambulation. She continues to smoke 1 pack of cigarettes per day however denies alcohol, recent drug use.  As per the sister, patient has lost more than 50 pounds since November after her last stroke.On further questioning, pt reports that she has altered taste and no appetite whatso ever, along with loose teeth , preventing her to eat any solid food. Malignancy work up has been initiated and CT abd , pelvis and chest with contrast ordered  On 3/27forfurther evaluation. CT of the chest with contrast showed acute bilateral pulmonary embolism and CT of the abdomen showed ascending colon colitis.   Per sister patient started have strange behavior since Christmas, she started to eat less, But patient has been able to perform ADLs,  even driving to work on March 15.  The patient has been very drowsy and weak. She has a very poor PO intake.   She received her first Covid vaccine on March 13, second dose scheduled on April 7.  Consultants   Neurology  Dentistry  Palliative care  Psychiatry  Cardiology  Procedures   None  Antibiotics   Anti-infectives (From admission, onward)   Start     Dose/Rate Route Frequency Ordered Stop   08/04/19 1015  amoxicillin-clavulanate (AUGMENTIN) 250-62.5 MG/5ML suspension 500 mg     500 mg Oral Every 12 hours 08/04/19 0844 08/08/19 2139   08/03/19 2000  fluconazole (DIFLUCAN) IVPB 100 mg  Status:  Discontinued     100 mg 50 mL/hr over 60 Minutes Intravenous Every 24 hours 08/03/19 1802 08/09/19 1813   08/01/19 1715  piperacillin-tazobactam (ZOSYN) IVPB 3.375 g  Status:  Discontinued     3.375 g 12.5 mL/hr over 240 Minutes Intravenous Every 8 hours 08/01/19 1712 08/04/19 0844   07/30/19 0000  cefTRIAXone (ROCEPHIN) 1 g in sodium chloride 0.9 % 100 mL IVPB  Status:  Discontinued     1 g 200 mL/hr over 30 Minutes Intravenous Every 24 hours 07/29/19 1420 07/29/19 1421   07/29/19 1421  cefTRIAXone (ROCEPHIN) 1 g in sodium chloride 0.9 % 100 mL IVPB  Status:  Discontinued     1 g 200 mL/hr over 30 Minutes Intravenous Every 24 hours 07/29/19 1421 08/01/19 0716      Subjective  The patient is lethargic this am. She did open her eyes and respond verbally to voice.   Objective   Vitals:  Vitals:   08/11/19 1342 08/11/19 1400  BP:  127/71 (!) 145/69  Pulse: 88 98  Resp: 16 18  Temp: 98 F (36.7 C) 98 F (36.7 C)  SpO2: 99% 99%   Exam:  Constitutional:   The patient is somnolent, but rousable to voice. No acute distress. Respiratory:   No increased work of breathing.  No wheezes, rales, or rhonchi  No tactile fremitus Cardiovascular:   Regular rate and rhythm  No murmurs, ectopy, or gallups.  No lateral PMI. No thrills. Abdomen:   Abdomen is soft,  non-tender, non-distended  No hernias, masses, or organomegaly  Normoactive bowel sounds.  Musculoskeletal:   No cyanosis, clubbing, or edema Skin:   No rashes, lesions, ulcers  palpation of skin: no induration or nodules Neurologic:   CN 2-12 intact  Sensation all 4 extremities intact  Bilateral upper and lower extremity weakness Psychiatric:   The patient is unable to cooperate with exam.  I have personally reviewed the following:   Today's Data   Vitals, CMP, CBC  Micro Data   UCx (3/22) + for E. coli  Scheduled Meds:  antiseptic oral rinse  15 mL Mouth Rinse BID   apixaban  5 mg Oral BID   Chlorhexidine Gluconate Cloth  6 each Topical Daily   clopidogrel  75 mg Oral Daily   feeding supplement (ENSURE ENLIVE)  237 mL Oral QID   hydrALAZINE  25 mg Oral Q8H   insulin aspart  0-15 Units Subcutaneous TID WC   insulin aspart  0-5 Units Subcutaneous QHS   latanoprost  1 drop Both Eyes QHS   lisinopril  20 mg Oral Daily   mirtazapine  7.5 mg Oral QHS   nicotine  21 mg Transdermal Daily   nystatin  5 mL Oral QID   sodium chloride flush  3 mL Intravenous Once   Continuous Infusions:  Immune Globulin 10% 35 g (08/11/19 1244)   lactated ringers with kcl 50 mL/hr at 08/10/19 1104    Principal Problem:   Major depressive disorder Active Problems:   Essential hypertension   Type 2 diabetes mellitus without complication (HCC)   CVA (cerebral vascular accident) ([place])   Hyperlipidemia   Generalized weakness   Hypokalemia   Hyponatremia   Acute on chronic kidney failure (HCC)   Thrombocytopenia (HCC)   Elevated troponin   Lactic acidosis   AKI (acute kidney injury) ([place])   Palliative care by specialist   Goals of care, counseling/discussion   Xerostomia   Dry lips   Failure to thrive in adult   Physical debility   Malnutrition of moderate degree   Esophageal thrush ([place])   Palliative care encounter   Pulmonary embolism ([place])   DVT (deep  venous thrombosis) ([place])   Thrush, oral   Colitis   LOS: 15 days   A & P  FTT/progressive weakness/poor oral intake with reported weight loss: Per sister patient started have strange behavior since Christmas, she started eat less, But patient has been able to perform ADLs, even driving to work on March 15. She is now very weak not able to lift lower extremity against gravity,bilateral r arm are very weak as well. MRI of the brain was repeated and no acute findings were found. Neurology (Dr. Cheral Marker) consulted to assess movement disorder, possible myopathy. MRI spine no acute findings, per neurology Dr. 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 Due to anticoagulation for PE treatment, not able to perform LP safely. For this reason Dr. Cheral Marker recommended empiric treatment with IVIG. Anti  GQ1b antibody ordered. I appreciate neurology's assistance.  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 Nutrition has been consulted and calorie count has been initiation. She has been started on Remeron for appetite stimulant, continue nutrition supplement. Esophagram and orthopantogram ordered on March 27th, It does not appear to have done, presumably due to lethargy and does not take oral meds reliably. She is receiving IVIG treatment as per neurology. If she has no improvement with IVIG treatment, may have to consider nutrition supplement per tube  Ascending colon colitis, incidental finding on CT scan during malignancy work-up: Patient denies any nausea or vomiting and abdominal pain. Blood culture have shown no growth. She has been treated with 10 days of antibiotics including Rocephin , Zosyn then oral Augmentin. She was also started on Diflucan on March 29. It was discontinued on April 4.  Bilateral  pulmonary embolism(incidental finding on ct chest during malignancy work-up) Patient was started on IV heparin plan to transition to oral Eliquis. Venous duplex of the lower extremities shows acute DVT involving the single left posterior tibial vein. Echocardiogram ordered showed left ventricular ejection fraction of 60 to 65% without any regional wall abnormalities. Left ventricular diastolic parameters are consistent with grade 1 diastolic dysfunction. No right heart strain seen.  Thrombocytopenia, acute on chronic: Platelet 88 on presentation 95 today. Likely due to PE and DVT  E. coli UTI: Fully treated.  Urinary retention, Foley inserted on April 3.  AKI superimposed on stage II CKD: Creatinine 1.38 on presentation, today is 1. 0. Repeat BMP in the morning. Monitor renal function.  Hypokalemia: supplement and monitor. remain low continue to replace K. Mag 2.0.  History of recurrent CVA: CVA on 12/25/2018 and 03/27/2019(has loop recorder). MRI brain obtained March 25 demonstrated no acute CVA. Continue Plavix and statin. She is on Eliquis now due to PE.  Constipation; appear resolved.  Body mass index is 33.95 kg/m.   First Covid vaccine  [employer] )on March 13  The patient has been seen and examined by me. I have spent 32 minutes in her evaluation and care.  DVT Prophylaxis: On Eliquis Code Status: DNR Family Communication: Sister over the phone with his permission on April 1 and on 4/3 Disposition Plan: The patient came from home. Anticipate discharge to SNF. Barriers to dc include patient is currently receiving IVIG, poor oral intake, and difficult to place due to lack of insurance.   Shary Lamos, DO [facility] Direct contact: see [email or web address]  7PM-7AM contact night coverage as above 08/11/2019, 4:39 PM  LOS: 14 days

## 2019-08-12 ENCOUNTER — Ambulatory Visit: Payer: Self-pay

## 2019-08-12 LAB — GLUCOSE, CAPILLARY
Glucose-Capillary: 104 mg/dL — ABNORMAL HIGH (ref 70–99)
Glucose-Capillary: 84 mg/dL (ref 70–99)
Glucose-Capillary: 86 mg/dL (ref 70–99)
Glucose-Capillary: 94 mg/dL (ref 70–99)

## 2019-08-12 LAB — CBC
HCT: 35.2 % — ABNORMAL LOW (ref 36.0–46.0)
Hemoglobin: 11.8 g/dL — ABNORMAL LOW (ref 12.0–15.0)
MCH: 30.2 pg (ref 26.0–34.0)
MCHC: 33.5 g/dL (ref 30.0–36.0)
MCV: 90 fL (ref 80.0–100.0)
Platelets: 108 10*3/uL — ABNORMAL LOW (ref 150–400)
RBC: 3.91 MIL/uL (ref 3.87–5.11)
RDW: 14.4 % (ref 11.5–15.5)
WBC: 4.8 10*3/uL (ref 4.0–10.5)
nRBC: 0 % (ref 0.0–0.2)

## 2019-08-12 MED ORDER — MIRTAZAPINE 15 MG PO TABS
15.0000 mg | ORAL_TABLET | Freq: Every day | ORAL | Status: DC
  Administered 2019-08-12 – 2019-08-27 (×16): 15 mg via ORAL
  Filled 2019-08-12 (×17): qty 1

## 2019-08-12 NOTE — Progress Notes (Signed)
  Speech Language Pathology Treatment: Dysphagia  Patient Details Name: Denise Santiago MRN: [id number] DOB: 1956-03-23 Today's Date: 08/12/2019 Time: 4580-9983 SLP Time Calculation (min) (ACUTE ONLY): 9 min  Assessment / Plan / Recommendation Clinical Impression  Pt was seen for skilled ST targeting dysphagia and diet tolerance.  She was encountered awake/alert, sitting upright in a recliner chair.  RN reported that pt continues to have decreased PO intake, but that she is tolerating Dysphagia 1 (puree) solids and thin liquids without overt difficulty.  Pt was agreeable to minimal trials of thin liquid via straw sip, puree, and soft solids (diced peaches).  Pt demonstrated difficulty with labial opening, and she took very small bites from the spoon.  Oral holding was noted with thin liquid and mastication of soft solids was prolonged.  Mastication appeared to be in a munching pattern vs rotary pattern and question how much pt actually masticated soft solids prior to swallow initiation.  No overt s/sx of aspiration were observed with any trials.  Recommend continuation of Dysphagia 1 (puree) solids and thin liquids with medications administered crushed in puree.  Pt will benefit from full supervision during PO intake to assist with self-feeding and to cue for compensatory strategies.  SLP will f/u per POC.     HPI HPI: 64 year old woman with prior history of hypertension, diabetes, hyperlipidemia, CVA presents to ED for generalized weakness and fall.  Probably secondary to electrolyte abnormalities per MD. MRI shows chronic cortically based infarct within the posterior right cerebral hemisphere predominantly affecting the right temporal and occipital lobes and with minimal involvement of the adjacent right parietal lobe. Redemonstrated chronic cortically based infarct within right frontal operculum. Stable background chronic small vessel ischemic disease with chronic lacunar infarcts in the right corona  radiata and bilateral basal ganglia.      SLP Plan  Continue with current plan of care       Recommendations  Diet recommendations: Dysphagia 1 (puree);Thin liquid Liquids provided via: Cup;Straw Medication Administration: Crushed with puree Supervision: Full supervision/cueing for compensatory strategies;Trained caregiver to feed patient Compensations: Slow rate;Small sips/bites;Follow solids with liquid Postural Changes and/or Swallow Maneuvers: Seated upright 90 degrees                Oral Care Recommendations: Oral care BID Follow up Recommendations: 24 hour supervision/assistance SLP Visit Diagnosis: Dysphagia, unspecified (R13.10) Plan: Continue with current plan of care       GO               Villa Herb M.S., CCC-SLP Acute Rehabilitation Services Office: [phone]  Shanon Rosser [facility] 08/12/2019, 2:51 PM

## 2019-08-12 NOTE — Progress Notes (Signed)
NEUROLOGY PROGRESS NOTE   Subjective: States she feels stronger in her lower extremities  Exam: Vitals:   08/12/19 0416 08/12/19 0749  BP: 135/65 128/79  Pulse: 88 93  Resp: 18 20  Temp: 98.6 F (37 C) 98.3 F (36.8 C)  SpO2: 100% 100%    Physical Exam  Constitutional: Appears well-developed and well-nourished.  Eyes: No scleral injection HENT: No OP obstrucion Head: Normocephalic.  Cardiovascular: Normal rate and regular rhythm.  Respiratory: Effort normal, non-labored breathing GI: Soft.  No distension. There is no tenderness.  Skin: WDI   Mental Status: Alert, oriented, thought content appropriate.  Speech fluent without evidence of aphasia.  Able to follow  commands without difficulty. Cranial Nerves: II: visual fields grossly normal, pupils equal, round, reactive to light and accommodation III,IV, VI: ptosis not present, extraocular muscles extra-ocular motions intact bilaterally V,VII: smile symmetric, facial light touch sensation normal bilaterally-should be noted when talking mouth moves minimally secondary to pain. VIII: hearing normal bilaterally IX,X: gag reflex present XI: trapezius strength/neck flexion strength normal bilaterally XII: tongue midline but due to pain cannot protrude all the way  Motor: Right : Upper extremity    Left:     Upper extremity 5/5 deltoid       5/5 deltoid 4/5 tricep      4-/5 tricep 5/5 biceps      5/5 biceps  5/5wrist flexion     5/5 wrist flexion 5/5 wrist extension     5/5 wrist extension 5/5 hand grip      5/5 hand grip  Lower extremity     Lower extremity 3/5 hip flexor      3/5 hip flexor Unable to raise bilateral legs outstretched off bed secondary to pain and weakness 4/5 quadricep      4/5 quadriceps  4-/5 hamstrings     4-/5 hamstrings 4/5 plantar flexion       5/5 plantar flexion 5/5 plantar extension     5/5 plantar extension -Tremor in bilateral arms and hands with arms held out straight  Sensory: Pinprick  and light touch intact throughout upper extremity.  Lower extremities she has decreased temperature, pinprick, light touch sensation up to her knee.  She has decreased vibratory sensation at her ankle.  She has intact proprioception Deep Tendon Reflexes:   1+ brachioradialis B/L .  Absent patellar and ankle reflexes Plantars: Right: downgoing   Left: downgoing Cerebellar: Unable to do heel-to-shin secondary to weakness and discomfort.  She has significant dysmetria with bilateral finger-to-nose    Medications:  Scheduled: . antiseptic oral rinse  15 mL Mouth Rinse BID  . apixaban  5 mg Oral BID  . Chlorhexidine Gluconate Cloth  6 each Topical Daily  . clopidogrel  75 mg Oral Daily  . feeding supplement (ENSURE ENLIVE)  237 mL Oral QID  . hydrALAZINE  25 mg Oral Q8H  . insulin aspart  0-15 Units Subcutaneous TID WC  . insulin aspart  0-5 Units Subcutaneous QHS  . latanoprost  1 drop Both Eyes QHS  . lisinopril  20 mg Oral Daily  . mirtazapine  7.5 mg Oral QHS  . nicotine  21 mg Transdermal Daily  . nystatin  5 mL Oral QID  . sodium chloride flush  3 mL Intravenous Once   Continuous: . Immune Globulin 10% 35 g (08/11/19 1244)  . lactated ringers with kcl Stopped (08/12/19 1026)    Pertinent Labs/Diagnostics: -Platelets 108 -Anti-GQ 1 pending release  Felicie Morn PA-C [facility] [phone]  Assessment:  This 64 year old female with progressive bilateral upper and lower extremity weakness.  Unfortunately was unable to get an LP secondary to history of PE and on apixaban.  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  For that reason patient was placed on IVIG for 5 days.  Exam today does show increased strength  Recommendations: -IVIG completed today Day 5/5 -PT/OT --Follow-up GQ 1B antibodies  08/12/2019, 11:00 AM  NEUROHOSPITALIST ADDENDUM Performed a face to face diagnostic evaluation.    I have reviewed the contents of history and physical exam as documented by PA/ARNP/Resident and agree with above documentation.  I have discussed and formulated the above plan as documented. Edits to the note have been made as needed.  Patient with subacute presentation upper and lower extremity weakness since receiving her Covid shot on March 13.  On examination patient has absent reflexes in lower extremities, diminished reflexes in upper extremities.  Significant ataxia on exam.  For this reason, Dr. 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  Order GQ 1 antibodies.  Unable to do the LP as patient on Eliquis, not able to discontinue it due to PE and DVT.  Patient appears to have improved, per Dr Yvetta Coder exam patient was not able to raise bilateral lower extremities against gravity.  This has certainly improved.  She has significant ataxia on exam out of proportion to weakness.   Need to follow-up to [facility] 1B antibodies and continue PT OT. Neurology will reassess in 3 days to see if patient is making progress.    Karena Addison Aroor MD [facility] [phone]   If 7pm to 7am, please call on call as listed on AMION.

## 2019-08-12 NOTE — Progress Notes (Signed)
Physical Therapy Treatment Patient Details Name: Denise Santiago MRN: [id number] DOB: 1956/03/11 Today's Date: 08/12/2019    History of Present Illness 64 y.o. female with a hx of DM, HTN, HLD, CVA 12/2018 & 03/2019, ongoing tob use who presented to ED with generalized weakness and elevated troponin, thought to be non specific by cardiology. pt reports weakness after COVID vaccine 07/16/19. The day prior to admission pt slid out of chair to floor and remained through the night.     PT Comments    Pt initially appeared alert and motivated to participate in therapy, although was confused and disoriented. With progression of mobility, pt with drowsiness but still responsive. Pt required max-total assist +2 for bed mobility and AP transfer OOB, and mod-max assist +2 for sitting EOB. Pt with definite neurologic s/s, including truncal extensor tone, UE ballistic-like movements and incoordination, and periodic LE extensor tone. Upon PT assessment, pt with no plantar or knee extensor reflexes. RN notified of findings. PT to continue to follow acutely.   Lift pad placed under pt for transfer back to bed via maximove.    Follow Up Recommendations  Supervision/Assistance - 24 hour;SNF     Equipment Recommendations  None recommended by PT    Recommendations for Other Services       Precautions / Restrictions Precautions Precautions: Fall Precaution Comments: reports falls since COVID vaccine Restrictions Weight Bearing Restrictions: No    Mobility  Bed Mobility Overal bed mobility: Needs Assistance Bed Mobility: Rolling;Supine to Sit;Sit to Supine Rolling: Total assist   Supine to sit: +2 for physical assistance;Total assist;+2 for safety/equipment;HOB elevated Sit to supine: Total assist;+2 for physical assistance;+2 for safety/equipment;HOB elevated   General bed mobility comments: max assist +2 for initial supine to sit as pt initiated walking LEs to EOB, but on second attempt after  return to supine to prepare for transfer, pt required total +2. Assist for trunk, LEs, scooting to and from EOB. Truncal extensor tone and ballistic-appearing movements in UEs upon sitting EOB, improved with increased hip flexion and positionig.  Transfers Overall transfer level: Needs assistance   Transfers: Comptroller transfers: Total assist;+2 physical assistance   General transfer comment: Total A to complete A/P transfer, with use of bed pads to scoot pt.  x3 scoots, +3 for positioning in chair and lifting pt with maximove to reposition.  Ambulation/Gait             General Gait Details: unable   Stairs             Wheelchair Mobility    Modified Rankin (Stroke Patients Only) Modified Rankin (Stroke Patients Only) Pre-Morbid Rankin Score: No significant disability Modified Rankin: Severe disability     Balance Overall balance assessment: Needs assistance Sitting-balance support: Bilateral upper extremity supported;Feet supported Sitting balance-Leahy Scale: Zero Sitting balance - Comments: required mod - max A to maintain sitting EOB, improved with cuing for R hand placement on bedrail. Pt requires specific cuing for handrail use due to pt preference of hooking forearm through rail.       Standing balance comment: unable to stand                            Cognition Arousal/Alertness: Lethargic Behavior During Therapy: Flat affect;Anxious Overall Cognitive Status: No family/caregiver present to determine baseline cognitive functioning Area of Impairment: Attention;Memory;Orientation;Following commands;Safety/judgement;Awareness;Problem solving  Orientation Level: Disoriented to;Time;Situation;Place Current Attention Level: Sustained Memory: Decreased short-term memory Following Commands: Follows one step commands with increased time;Follows one step commands  inconsistently Safety/Judgement: Decreased awareness of safety;Decreased awareness of deficits Awareness: Intellectual Problem Solving: Slow processing;Decreased initiation;Difficulty sequencing;Requires verbal cues General Comments: Pt initially responsive but disoriented, then for the final 75% of the session the patients eyes were closed requiring increased cues but often not opening eyes      Exercises      General Comments        Pertinent Vitals/Pain Pain Assessment: Faces Faces Pain Scale: Hurts little more Pain Location: bilateral ankles, lower legs - "it feels like needles" Pain Descriptors / Indicators: Pins and needles Pain Intervention(s): Limited activity within patient's tolerance;Monitored during session;Repositioned;Other (comment)(RN notified)    Home Living                      Prior Function            PT Goals (current goals can now be found in the care plan section) Acute Rehab PT Goals Patient Stated Goal: none stated PT Goal Formulation: With patient Time For Goal Achievement: 08/24/19 Potential to Achieve Goals: Fair Progress towards PT goals: Not progressing toward goals - comment(total +2 assist)    Frequency    Min 3X/week      PT Plan Current plan remains appropriate    Co-evaluation PT/OT/SLP Co-Evaluation/Treatment: Yes Reason for Co-Treatment: Complexity of the patient's impairments (multi-system involvement);Necessary to address cognition/behavior during functional activity;For patient/therapist safety;To address functional/ADL transfers PT goals addressed during session: Mobility/safety with mobility;Balance OT goals addressed during session: Proper use of Adaptive equipment and DME;Strengthening/ROM      AM-PAC PT "6 Clicks" Mobility   Outcome Measure  Help needed turning from your back to your side while in a flat bed without using bedrails?: Total Help needed moving from lying on your back to sitting on the side of  a flat bed without using bedrails?: Total Help needed moving to and from a bed to a chair (including a wheelchair)?: Total Help needed standing up from a chair using your arms (e.g., wheelchair or bedside chair)?: Total Help needed to walk in hospital room?: Total Help needed climbing 3-5 steps with a railing? : Total 6 Click Score: 6    End of Session   Activity Tolerance: Patient limited by lethargy;Patient limited by fatigue Patient left: with call bell/phone within reach;in chair;with chair alarm set Nurse Communication: Mobility status;Need for lift equipment PT Visit Diagnosis: Muscle weakness (generalized) (M62.81);Difficulty in walking, not elsewhere classified (R26.2)     Time: 2585-2778 PT Time Calculation (min) (ACUTE ONLY): 41 min  Charges:  $Therapeutic Activity: 8-22 mins                      Sid Greener E, PT Acute Rehabilitation Services Pager [phone]  Office [phone]    Mikeya Tomasetti D Despina Hidden 08/12/2019, 4:12 PM

## 2019-08-12 NOTE — Progress Notes (Signed)
PROGRESS NOTE  Denise Santiago ONG:[id number] DOB: 1955-09-23 DOA: 07/26/2019 PCP: Denise Carol, MD  Brief History   Denise Santiago a 64 y.o.femalewith medical history significant ofhypertension, diabetes mellitus, hyperlipidemia, CVA on 12/25/2018 and 03/27/2019(has loop recorder), tobacco abuse presents to emergency department due to generalized weakness.  Patient tells me that she received Covid vaccine last Saturday and since then she has been feeling weak, lethargic, has no energy and has decreased appetite. Yesterday she slid out of her chair onto the floor and was not able to get up due to generalized weakness. She stayed on the floor all night long. Her niece was with her at that time but she could not get her up as well. EMS was called this morning and brought here to the emergency department for further evaluation and management.  Patient denies headache, blurry vision, slurred speech, head trauma, loss of consciousness, seizures, chest pain, shortness of breath, palpitation, leg swelling, fever, chills, nausea, vomiting, diarrhea, cough, congestion, wheezing, dysuria or any other urinary symptoms.  She lives alone and uses walker and cane for ambulation. She continues to smoke 1 pack of cigarettes per day however denies alcohol, recent drug use.  As per the sister, patient has lost more than 50 pounds since November after her last stroke.On further questioning, pt reports that she has altered taste and no appetite whatso ever, along with loose teeth , preventing her to eat any solid food. Malignancy work up has been initiated and CT abd , pelvis and chest with contrast ordered  On 3/79forfurther evaluation. CT of the chest with contrast showed acute bilateral pulmonary embolism and CT of the abdomen showed ascending colon colitis.   Per sister patient started have strange behavior since Christmas, she started to eat less, But patient has been able to perform ADLs,  even driving to work on March 15.  The patient has been very drowsy and weak. She has a very poor PO intake.   She received her first Covid vaccine on March 13, second dose scheduled on April 7.  Consultants  . Neurology . Dentistry . Palliative care . Psychiatry . Cardiology  Procedures  . None  Antibiotics   Anti-infectives (From admission, onward)   Start     Dose/Rate Route Frequency Ordered Stop   08/04/19 1015  amoxicillin-clavulanate (AUGMENTIN) 250-62.5 MG/5ML suspension 500 mg     500 mg Oral Every 12 hours 08/04/19 0844 08/08/19 2139   08/03/19 2000  fluconazole (DIFLUCAN) IVPB 100 mg  Status:  Discontinued     100 mg 50 mL/hr over 60 Minutes Intravenous Every 24 hours 08/03/19 1802 08/09/19 1813   08/01/19 1715  piperacillin-tazobactam (ZOSYN) IVPB 3.375 g  Status:  Discontinued     3.375 g 12.5 mL/hr over 240 Minutes Intravenous Every 8 hours 08/01/19 1712 08/04/19 0844   07/30/19 0000  cefTRIAXone (ROCEPHIN) 1 g in sodium chloride 0.9 % 100 mL IVPB  Status:  Discontinued     1 g 200 mL/hr over 30 Minutes Intravenous Every 24 hours 07/29/19 1420 07/29/19 1421   07/29/19 1421  cefTRIAXone (ROCEPHIN) 1 g in sodium chloride 0.9 % 100 mL IVPB  Status:  Discontinued     1 g 200 mL/hr over 30 Minutes Intravenous Every 24 hours 07/29/19 1421 08/01/19 0716      Subjective  The patient is responsive to voice this morning. She seems much more alert. She states that she is seeing, hearing, and "feeling" a child in her room that has been bothering her.  Objective   Vitals:  Vitals:   08/12/19 1144 08/12/19 1244  BP: 133/84 (!) 171/93  Pulse: 98 (!) 107  Resp: 16 18  Temp: 98.7 F (37.1 C) 97.8 F (36.6 C)  SpO2: 99% 99%   Exam:  Constitutional:  . The patient is awake and alert. She is hallucinating. No acute distress.  Respiratory:  . No increased work of breathing. . No wheezes, rales, or rhonchi . No tactile fremitus Cardiovascular:  . Regular rate  and rhythm . No murmurs, ectopy, or gallups. . No lateral PMI. No thrills. Abdomen:  . Abdomen is soft, non-tender, non-distended . No hernias, masses, or organomegaly . Normoactive bowel sounds.  Musculoskeletal:  . No cyanosis, clubbing, or edema Skin:  . No rashes, lesions, ulcers . palpation of skin: no induration or nodules Neurologic:  . CN 2-12 intact . Sensation all 4 extremities intact . Bilateral upper and lower extremity weakness Psychiatric:  . The patient is awake, alert, and oriented x 3. . Visual, tactile, and auditory hallucinations  I have personally reviewed the following:   Today's Data  . Vitals, CMP, CBC  Micro Data  . UCx (3/22) + for E. coli  Scheduled Meds: . antiseptic oral rinse  15 mL Mouth Rinse BID  . apixaban  5 mg Oral BID  . Chlorhexidine Gluconate Cloth  6 each Topical Daily  . clopidogrel  75 mg Oral Daily  . feeding supplement (ENSURE ENLIVE)  237 mL Oral QID  . hydrALAZINE  25 mg Oral Q8H  . insulin aspart  0-15 Units Subcutaneous TID WC  . insulin aspart  0-5 Units Subcutaneous QHS  . latanoprost  1 drop Both Eyes QHS  . lisinopril  20 mg Oral Daily  . mirtazapine  7.5 mg Oral QHS  . nicotine  21 mg Transdermal Daily  . nystatin  5 mL Oral QID  . sodium chloride flush  3 mL Intravenous Once   Continuous Infusions: . lactated ringers with kcl Stopped (08/12/19 1026)    Principal Problem:   Major depressive disorder Active Problems:   Essential hypertension   Type 2 diabetes mellitus without complication (HCC)   CVA (cerebral vascular accident) (HCC)   Hyperlipidemia   Generalized weakness   Hypokalemia   Hyponatremia   Acute on chronic kidney failure (HCC)   Thrombocytopenia (HCC)   Elevated troponin   Lactic acidosis   AKI (acute kidney injury) (HCC)   Palliative care by specialist   Goals of care, counseling/discussion   Xerostomia   Dry lips   Failure to thrive in adult   Physical debility   Malnutrition of  moderate degree   Esophageal thrush (HCC)   Palliative care encounter   Pulmonary embolism (HCC)   DVT (deep venous thrombosis) (HCC)   Thrush, oral   Colitis   LOS: 16 days   A & P  FTT/progressive weakness/poor oral intake with reported weight loss: Per sister patient started have strange behavior since Christmas, she started eat less, But patient has been able to perform ADLs, even driving to work on March 15. She is now very weak not able to lift lower extremity against gravity,bilateral r arm are very weak as well. MRI of the brain was repeated and no acute findings were found. Neurology (Dr. Otelia Limes) consulted to assess movement disorder, possible myopathy. MRI spine no acute findings, per neurology Dr. 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 Due to anticoagulation for PE treatment, not able to perform  LP safely. For this reason Dr. Otelia Limes recommended empiric treatment with IVIG. Anti GQ1b antibody ordered. I appreciate neurology's assistance.  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 Nutrition has been consulted and calorie count has been initiation. She has been started on Remeron for appetite stimulant, continue nutrition supplement. Esophagram and orthopantogram ordered on March 27th, It does not appear to have done, presumably due to lethargy and does not take oral meds reliably. She is receiving IVIG treatment as per neurology. If she has no improvement with IVIG treatment, may have to consider nutrition supplement per tube  Ascending colon colitis, incidental finding on CT scan during malignancy work-up: Patient denies any nausea or vomiting and abdominal pain. Blood culture have shown no growth. She has been treated with 10 days of antibiotics including Rocephin , Zosyn then  oral Augmentin. She was also started on Diflucan on March 29. It was discontinued on April 4.  Bilateral pulmonary embolism(incidental finding on ct chest during malignancy work-up) Patient was started on IV heparin plan to transition to oral Eliquis. Venous duplex of the lower extremities shows acute DVT involving the single left posterior tibial vein. Echocardiogram ordered showed left ventricular ejection fraction of 60 to 65% without any regional wall abnormalities. Left ventricular diastolic parameters are consistent with grade 1 diastolic dysfunction. No right heart strain seen.  Thrombocytopenia, acute on chronic: Platelet 88 on presentation 95 today. Likely due to PE and DVT  E. coli UTI: Fully treated.  Urinary retention, Foley inserted on April 3.  AKI superimposed on stage II CKD: Creatinine 1.38 on presentation, today is 1. 0. Repeat BMP in the morning. Monitor renal function.  Hypokalemia: supplement and monitor. remain low continue to replace K. Mag 2.0.  History of recurrent CVA: CVA on 12/25/2018 and 03/27/2019(has loop recorder). MRI brain obtained March 25 demonstrated no acute CVA. Continue Plavix and statin. She is on Eliquis now due to PE.  Constipation; appear resolved.  Psychosis: pt is having hallucinations. Will increase dose of Remeron.  Body mass index is 33.95 kg/m.   First Covid vaccine  [employer] )on March 13  The patient has been seen and examined by me. I have spent 32 minutes in her evaluation and care.  DVT Prophylaxis: On Eliquis Code Status: DNR Family Communication: Sister over the phone with his permission on April 1 and on 4/3 Disposition Plan: The patient came from home. Anticipate discharge to SNF. Barriers to dc include patient is currently receiving IVIG, poor oral intake, and difficult to place due to lack of insurance.  Jessamy Torosyan, DO [facility] Direct contact: see [email or web address]  7PM-7AM contact night coverage as  above 08/12/2019, 3:09 PM  LOS: 14 days

## 2019-08-12 NOTE — Progress Notes (Signed)
Occupational Therapy Treatment Patient Details Name: Denise Santiago MRN: [id number] DOB: 1955/12/24 Today's Date: 08/12/2019    History of present illness 64 y.o. female with a hx of DM, HTN, HLD, CVA 12/2018 & 03/2019, ongoing tob use who presented to ED with generalized weakness and elevated troponin, thought to be non specific by cardiology. pt reports weakness after COVID vaccine 07/16/19. The day prior to admission pt slid out of chair to floor and remained through the night.    OT comments  Patient continues to make progress towards goals in skilled OT session. Patient's session encompassed AP transfer to promote increased participation in session and promote pts ability to participate in OOB attempts. Pt initially max A to come to EOB, then when repositioned to complete AP transfer, was total A to come to EOB. Pt dependent on PT and OT to complete AP transfer, with increased tone noted in all extremities, especially in BUEs. Therapist recommending air mattress for future sessions placing pt in chair position due to current status; will continue to follow acutely.    Follow Up Recommendations  SNF    Equipment Recommendations  Other (comment);Hospital bed;Wheelchair cushion (measurements OT);Wheelchair (measurements OT)    Recommendations for Other Services      Precautions / Restrictions Precautions Precautions: Fall Precaution Comments: reports falls since COVID vaccine Restrictions Weight Bearing Restrictions: No       Mobility Bed Mobility Overal bed mobility: Needs Assistance       Supine to sit: Max assist;+2 for physical assistance;Total assist     General bed mobility comments: Max A to EOB x1 then total A for EOB x1 in order to reposition, increased extensor tone noted when coming to EOB on the second trial  Transfers Overall transfer level: Needs assistance   Transfers: Licensed conveyancer transfers: Total assist;+2  physical assistance   General transfer comment: Total A to complete A/P transfer    Balance Overall balance assessment: Needs assistance Sitting-balance support: Bilateral upper extremity supported;Feet supported Sitting balance-Leahy Scale: Zero Sitting balance - Comments: required mod - max A to maintain sitting EOB       Standing balance comment: unable to stand                           ADL either performed or assessed with clinical judgement   ADL Overall ADL's : Needs assistance/impaired                                     Functional mobility during ADLs: Total assistance;Maximal assistance General ADL Comments: pt remains total A in ADLs but increased grasp noted with multimodal cues     Vision Baseline Vision/History: Wears glasses     Perception     Praxis      Cognition Arousal/Alertness: Lethargic Behavior During Therapy: Flat affect;Anxious Overall Cognitive Status: No family/caregiver present to determine baseline cognitive functioning Area of Impairment: Attention;Memory;Orientation;Following commands;Safety/judgement;Awareness;Problem solving                 Orientation Level: Disoriented to;Time;Situation;Place Current Attention Level: Sustained Memory: Decreased short-term memory Following Commands: Follows one step commands with increased time;Follows one step commands inconsistently Safety/Judgement: Decreased awareness of safety;Decreased awareness of deficits Awareness: Intellectual Problem Solving: Slow processing;Decreased initiation;Difficulty sequencing;Requires verbal cues General Comments: Pt initially responsive but disoriented, then for the final 75% of the  session the patients eyes were closed requiring increased cues but often not opening eyes        Exercises     Shoulder Instructions       General Comments      Pertinent Vitals/ Pain       Pain Assessment: Faces Faces Pain Scale: Hurts whole  lot Pain Location: Needles in R ankle Pain Descriptors / Indicators: Pins and needles;Discomfort Pain Intervention(s): Limited activity within patient's tolerance;Monitored during session;Repositioned  Home Living                                          Prior Functioning/Environment              Frequency  Min 2X/week        Progress Toward Goals  OT Goals(current goals can now be found in the care plan section)  Progress towards OT goals: Progressing toward goals  Acute Rehab OT Goals Patient Stated Goal: none stated OT Goal Formulation: Patient unable to participate in goal setting Time For Goal Achievement: 08/12/19 Potential to Achieve Goals: [place] Discharge plan remains appropriate    Co-evaluation    PT/OT/SLP Co-Evaluation/Treatment: Yes Reason for Co-Treatment: Complexity of the patient's impairments (multi-system involvement);Necessary to address cognition/behavior during functional activity;For patient/therapist safety;To address functional/ADL transfers   OT goals addressed during session: Proper use of Adaptive equipment and DME;Strengthening/ROM      AM-PAC OT "6 Clicks" Daily Activity     Outcome Measure   Help from another person eating meals?: Total Help from another person taking care of personal grooming?: Total Help from another person toileting, which includes using toliet, bedpan, or urinal?: Total Help from another person bathing (including washing, rinsing, drying)?: Total Help from another person to put on and taking off regular upper body clothing?: Total Help from another person to put on and taking off regular lower body clothing?: Total 6 Click Score: 6    End of Session    OT Visit Diagnosis: Unsteadiness on feet (R26.81);Muscle weakness (generalized) (M62.81)   Activity Tolerance Patient limited by fatigue;Patient limited by lethargy   Patient Left in chair;with call bell/phone within reach;with chair  alarm set   Nurse Communication Mobility status;Precautions        Time: 9518-8416 OT Time Calculation (min): 41 min  Charges: OT Treatments $Self Care/Home Management : 8-22 mins  Corinne Ports E. Gisele Pack, COTA/L [facility] [place] 08/12/2019, 2:32 PM

## 2019-08-13 LAB — URINALYSIS, ROUTINE W REFLEX MICROSCOPIC

## 2019-08-13 LAB — GLUCOSE, CAPILLARY
Glucose-Capillary: 101 mg/dL — ABNORMAL HIGH (ref 70–99)
Glucose-Capillary: 133 mg/dL — ABNORMAL HIGH (ref 70–99)
Glucose-Capillary: 141 mg/dL — ABNORMAL HIGH (ref 70–99)
Glucose-Capillary: 130 mg/dL — ABNORMAL HIGH (ref 70–99)

## 2019-08-13 LAB — URINALYSIS, MICROSCOPIC (REFLEX): RBC / HPF: >50 RBC/hpf (ref 0–5)

## 2019-08-13 MED ORDER — PIPERACILLIN-TAZOBACTAM 3.375 G IVPB
3.3750 g | Freq: Three times a day (TID) | INTRAVENOUS | Status: DC
  Administered 2019-08-13 – 2019-08-15 (×5): 3.375 g via INTRAVENOUS
  Filled 2019-08-13 (×5): qty 50

## 2019-08-13 MED ORDER — PIPERACILLIN-TAZOBACTAM 3.375 G IVPB 30 MIN
3.3750 g | Freq: Three times a day (TID) | INTRAVENOUS | Status: DC
  Administered 2019-08-13: 3.375 g via INTRAVENOUS
  Filled 2019-08-13 (×2): qty 50

## 2019-08-13 NOTE — Progress Notes (Signed)
PROGRESS NOTE  Denise Santiago WGN:[id number] DOB: 08-14-1955 DOA: 07/26/2019 PCP: Renford Dills, MD  Brief History   Denise Santiago a 64 y.o.femalewith medical history significant ofhypertension, diabetes mellitus, hyperlipidemia, CVA on 12/25/2018 and 03/27/2019(has loop recorder), tobacco abuse presents to emergency department due to generalized weakness.  Patient tells me that she received Covid vaccine last Saturday and since then she has been feeling weak, lethargic, has no energy and has decreased appetite. Yesterday she slid out of her chair onto the floor and was not able to get up due to generalized weakness. She stayed on the floor all night long. Her niece was with her at that time but she could not get her up as well. EMS was called this morning and brought here to the emergency department for further evaluation and management.  Patient denies headache, blurry vision, slurred speech, head trauma, loss of consciousness, seizures, chest pain, shortness of breath, palpitation, leg swelling, fever, chills, nausea, vomiting, diarrhea, cough, congestion, wheezing, dysuria or any other urinary symptoms.  She lives alone and uses walker and cane for ambulation. She continues to smoke 1 pack of cigarettes per day however denies alcohol, recent drug use.  As per the sister, patient has lost more than 50 pounds since November after her last stroke.On further questioning, pt reports that she has altered taste and no appetite whatso ever, along with loose teeth , preventing her to eat any solid food. Malignancy work up has been initiated and CT abd , pelvis and chest with contrast ordered  On 3/27forfurther evaluation. CT of the chest with contrast showed acute bilateral pulmonary embolism and CT of the abdomen showed ascending colon colitis.   Per sister patient started have strange behavior since Christmas, she started to eat less, But patient has been able to perform ADLs,  even driving to work on March 15.  The patient has been very drowsy and weak. She has a very poor PO intake.   She received her first Covid vaccine on March 13, second dose scheduled on April 7.  Consultants  . Neurology . Dentistry . Palliative care . Psychiatry . Cardiology  Procedures  . None  Antibiotics   Anti-infectives (From admission, onward)   Start     Dose/Rate Route Frequency Ordered Stop   08/04/19 1015  amoxicillin-clavulanate (AUGMENTIN) 250-62.5 MG/5ML suspension 500 mg     500 mg Oral Every 12 hours 08/04/19 0844 08/08/19 2139   08/03/19 2000  fluconazole (DIFLUCAN) IVPB 100 mg  Status:  Discontinued     100 mg 50 mL/hr over 60 Minutes Intravenous Every 24 hours 08/03/19 1802 08/09/19 1813   08/01/19 1715  piperacillin-tazobactam (ZOSYN) IVPB 3.375 g  Status:  Discontinued     3.375 g 12.5 mL/hr over 240 Minutes Intravenous Every 8 hours 08/01/19 1712 08/04/19 0844   07/30/19 0000  cefTRIAXone (ROCEPHIN) 1 g in sodium chloride 0.9 % 100 mL IVPB  Status:  Discontinued     1 g 200 mL/hr over 30 Minutes Intravenous Every 24 hours 07/29/19 1420 07/29/19 1421   07/29/19 1421  cefTRIAXone (ROCEPHIN) 1 g in sodium chloride 0.9 % 100 mL IVPB  Status:  Discontinued     1 g 200 mL/hr over 30 Minutes Intravenous Every 24 hours 07/29/19 1421 08/01/19 0716      Subjective  The patient is awake and alert this morning. She thinks she may have had some hallucinations this morning, but that they are much better than yesterday. Nursing states that they have not  noticed any hallucinations today, although the patient was confused this morning before she was reoriented. Pt has blood tinged urine in foley bag.  Objective   Vitals:  Vitals:   08/13/19 0851 08/13/19 1230  BP:  (!) 142/80  Pulse: 98 99  Resp:  20  Temp:  98.4 F (36.9 C)  SpO2:  100%   Exam:  Constitutional:  . The patient is awake and alert. No acute distress.  Respiratory:  . No increased work of  breathing. . No wheezes, rales, or rhonchi . No tactile fremitus Cardiovascular:  . Regular rate and rhythm . No murmurs, ectopy, or gallups. . No lateral PMI. No thrills. Abdomen:  . Abdomen is soft, non-tender, non-distended . No hernias, masses, or organomegaly . Normoactive bowel sounds.  Musculoskeletal:  . No cyanosis, clubbing, or edema Skin:  . No rashes, lesions, ulcers . palpation of skin: no induration or nodules Neurologic:  . CN 2-12 intact . Sensation all 4 extremities intact . Bilateral upper and lower extremity weakness Psychiatric:  . The patient is awake, alert, and oriented x 3. . Visual, tactile, and auditory hallucinations  I have personally reviewed the following:   Today's Data  . Vitals, urinalysis  Micro Data  . UCx (3/22) + for E. coli  Scheduled Meds: . antiseptic oral rinse  15 mL Mouth Rinse BID  . apixaban  5 mg Oral BID  . Chlorhexidine Gluconate Cloth  6 each Topical Daily  . clopidogrel  75 mg Oral Daily  . feeding supplement (ENSURE ENLIVE)  237 mL Oral QID  . hydrALAZINE  25 mg Oral Q8H  . insulin aspart  0-15 Units Subcutaneous TID WC  . insulin aspart  0-5 Units Subcutaneous QHS  . latanoprost  1 drop Both Eyes QHS  . lisinopril  20 mg Oral Daily  . mirtazapine  15 mg Oral QHS  . nicotine  21 mg Transdermal Daily  . nystatin  5 mL Oral QID  . sodium chloride flush  3 mL Intravenous Once   Continuous Infusions:   Principal Problem:   Major depressive disorder Active Problems:   Essential hypertension   Type 2 diabetes mellitus without complication (HCC)   CVA (cerebral vascular accident) ([place])   Hyperlipidemia   Generalized weakness   Hypokalemia   Hyponatremia   Acute on chronic kidney failure (HCC)   Thrombocytopenia (HCC)   Elevated troponin   Lactic acidosis   AKI (acute kidney injury) ([place])   Palliative care by specialist   Goals of care, counseling/discussion   Xerostomia   Dry lips   Failure to thrive in  adult   Physical debility   Malnutrition of moderate degree   Esophageal thrush (HCC)   Palliative care encounter   Pulmonary embolism (HCC)   DVT (deep venous thrombosis) ([place])   Thrush, oral   Colitis   LOS: 17 days   A & P  FTT/progressive weakness/poor oral intake with reported weight loss: Per sister patient started have strange behavior since Christmas, she started eat less, But patient has been able to perform ADLs, even driving to work on March 15. She is now very weak not able to lift lower extremity against gravity,bilateral r arm are very weak as well. MRI of the brain was repeated and no acute findings were found. Neurology (Dr. Cheral Marker) consulted to assess movement disorder, possible myopathy. MRI spine no acute findings, per neurology Dr. 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 Due to anticoagulation  for PE treatment, not able to perform LP safely. For this reason Dr. Otelia Limes recommended empiric treatment with IVIG. Anti GQ1b antibody ordered. I appreciate neurology's assistance.  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 Nutrition has been consulted and calorie count has been initiation. She has been started on Remeron for appetite stimulant, continue nutrition supplement. Esophagram and orthopantogram ordered on March 27th, It does not appear to have done, presumably due to lethargy and does not take oral meds reliably. She is receiving IVIG treatment as per neurology. If she has no improvement with IVIG treatment, may have to consider nutrition supplement per tube  Ascending colon colitis, incidental finding on CT scan during malignancy work-up: Patient denies any nausea or vomiting and abdominal pain. Blood culture have shown no growth. She has been treated with 10 days  of antibiotics including Rocephin , Zosyn then oral Augmentin. She was also started on Diflucan on March 29. It was discontinued on April 4.  Bilateral pulmonary embolism(incidental finding on ct chest during malignancy work-up) Patient was started on IV heparin plan to transition to oral Eliquis. Venous duplex of the lower extremities shows acute DVT involving the single left posterior tibial vein. Echocardiogram ordered showed left ventricular ejection fraction of 60 to 65% without any regional wall abnormalities. Left ventricular diastolic parameters are consistent with grade 1 diastolic dysfunction. No right heart strain seen.  Hematuria: UA has demonstrated triple phosphate crystals. Urine culture has been ordered. I will start the patient on IV Antibiotics to cover for klebsiella pneumonia and proteus species.   Thrombocytopenia, acute on chronic: Platelet 88 on presentation 95 today. Likely due to PE and DVT  Urinary retention, Foley inserted on April 3. Removed today. Voiding trial ordered. Monitored.  AKI superimposed on stage II CKD: Creatinine 1.38 on presentation, today is 1. 0. Repeat BMP in the morning. Monitor renal function.  Hypokalemia: supplement and monitor. remain low continue to replace K. Mag 2.0.  History of recurrent CVA: CVA on 12/25/2018 and 03/27/2019(has loop recorder). MRI brain obtained March 25 demonstrated no acute CVA. Continue Plavix and statin. She is on Eliquis now due to PE.  Constipation; appear resolved.  Psychosis: pt is having hallucinations. Will increase dose of Remeron.  Body mass index is 33.95 kg/m.   First Covid vaccine  [employer] )on March 13  The patient has been seen and examined by me. I have spent 34 minutes in her evaluation and care.  DVT Prophylaxis: On Eliquis Code Status: DNR Family Communication: Sister over the phone with his permission on April 1 and on 4/3 Disposition Plan: The patient came from home. Anticipate  discharge to SNF. Barriers to dc include patient is currently receiving IVIG, poor oral intake, and difficult to place due to lack of insurance.  Denise Althoff, DO [facility] Direct contact: see [email or web address]  7PM-7AM contact night coverage as above 08/13/2019, 2:11 PM  LOS: 14 days

## 2019-08-14 ENCOUNTER — Ambulatory Visit: Payer: Self-pay | Admitting: Cardiology

## 2019-08-14 LAB — GLUCOSE, CAPILLARY
Glucose-Capillary: 121 mg/dL — ABNORMAL HIGH (ref 70–99)
Glucose-Capillary: 141 mg/dL — ABNORMAL HIGH (ref 70–99)
Glucose-Capillary: 99 mg/dL (ref 70–99)
Glucose-Capillary: 161 mg/dL — ABNORMAL HIGH (ref 70–99)

## 2019-08-14 LAB — URINE CULTURE: Culture: NO GROWTH

## 2019-08-14 LAB — BASIC METABOLIC PANEL
Anion gap: 8 (ref 5–15)
BUN: 11 mg/dL (ref 8–23)
CO2: 25 mmol/L (ref 22–32)
Calcium: 8.5 mg/dL — ABNORMAL LOW (ref 8.9–10.3)
Chloride: 105 mmol/L (ref 98–111)
Creatinine, Ser: 0.76 mg/dL (ref 0.44–1.00)
GFR calc Af Amer: >60 mL/min (ref >60)
GFR calc non Af Amer: >60 mL/min (ref >60)
Glucose, Bld: 120 mg/dL — ABNORMAL HIGH (ref 70–99)
Potassium: 3.5 mmol/L (ref 3.5–5.1)
Sodium: 138 mmol/L (ref 135–145)

## 2019-08-14 LAB — CBC
HCT: 33.4 % — ABNORMAL LOW (ref 36.0–46.0)
Hemoglobin: 11.4 g/dL — ABNORMAL LOW (ref 12.0–15.0)
MCH: 30.4 pg (ref 26.0–34.0)
MCHC: 34.1 g/dL (ref 30.0–36.0)
MCV: 89.1 fL (ref 80.0–100.0)
Platelets: 113 10*3/uL — ABNORMAL LOW (ref 150–400)
RBC: 3.75 MIL/uL — ABNORMAL LOW (ref 3.87–5.11)
RDW: 14.6 % (ref 11.5–15.5)
WBC: 4 10*3/uL (ref 4.0–10.5)

## 2019-08-14 NOTE — Progress Notes (Signed)
PROGRESS NOTE  Denise Santiago YDX:[id number] DOB: [birth year] DOA: 07/26/2019 PCP: Renford Dills, MD  Brief History   Denise Hamiltonis a 64 y.o.femalewith medical history significant ofhypertension, diabetes mellitus, hyperlipidemia, CVA on 12/25/2018 and 03/27/2019(has loop recorder), tobacco abuse presents to emergency department due to generalized weakness.  Patient tells me that she received Covid vaccine last Saturday and since then she has been feeling weak, lethargic, has no energy and has decreased appetite. Yesterday she slid out of her chair onto the floor and was not able to get up due to generalized weakness. She stayed on the floor all night long. Her niece was with her at that time but she could not get her up as well. EMS was called this morning and brought here to the emergency department for further evaluation and management.  Patient denies headache, blurry vision, slurred speech, head trauma, loss of consciousness, seizures, chest pain, shortness of breath, palpitation, leg swelling, fever, chills, nausea, vomiting, diarrhea, cough, congestion, wheezing, dysuria or any other urinary symptoms.  She lives alone and uses walker and cane for ambulation. She continues to smoke 1 pack of cigarettes per day however denies alcohol, recent drug use.  As per the sister, patient has lost more than 50 pounds since November after her last stroke.On further questioning, pt reports that she has altered taste and no appetite whatso ever, along with loose teeth , preventing her to eat any solid food. Malignancy work up has been initiated and CT abd , pelvis and chest with contrast ordered  On 3/27forfurther evaluation. CT of the chest with contrast showed acute bilateral pulmonary embolism and CT of the abdomen showed ascending colon colitis.   Per sister patient started have strange behavior since Christmas, she started to eat less, But patient has been able to perform ADLs,  even driving to work on March 15.  The patient has been very drowsy and weak. She has a very poor PO intake. Nutrition has recommended a 48 hour calorie count over the weekend to determine her ability to meet her nutritional needs through her oral intake.   She received her first Covid vaccine on March 13, second dose scheduled on April 7.  Consultants  . Neurology . Dentistry . Palliative care . Psychiatry . Cardiology  Procedures  . None  Antibiotics   Anti-infectives (From admission, onward)   Start     Dose/Rate Route Frequency Ordered Stop   08/13/19 2330  piperacillin-tazobactam (ZOSYN) IVPB 3.375 g     3.375 g 12.5 mL/hr over 240 Minutes Intravenous Every 8 hours 08/13/19 1610     08/13/19 1530  piperacillin-tazobactam (ZOSYN) IVPB 3.375 g  Status:  Discontinued     3.375 g 12.5 mL/hr over 240 Minutes Intravenous Every 8 hours 08/13/19 1412 08/13/19 1610   08/04/19 1015  amoxicillin-clavulanate (AUGMENTIN) 250-62.5 MG/5ML suspension 500 mg     500 mg Oral Every 12 hours 08/04/19 0844 08/08/19 2139   08/03/19 2000  fluconazole (DIFLUCAN) IVPB 100 mg  Status:  Discontinued     100 mg 50 mL/hr over 60 Minutes Intravenous Every 24 hours 08/03/19 1802 08/09/19 1813   08/01/19 1715  piperacillin-tazobactam (ZOSYN) IVPB 3.375 g  Status:  Discontinued     3.375 g 12.5 mL/hr over 240 Minutes Intravenous Every 8 hours 08/01/19 1712 08/04/19 0844   07/30/19 0000  cefTRIAXone (ROCEPHIN) 1 g in sodium chloride 0.9 % 100 mL IVPB  Status:  Discontinued     1 g 200 mL/hr over 30 Minutes Intravenous  Every 24 hours 07/29/19 1420 07/29/19 1421   07/29/19 1421  cefTRIAXone (ROCEPHIN) 1 g in sodium chloride 0.9 % 100 mL IVPB  Status:  Discontinued     1 g 200 mL/hr over 30 Minutes Intravenous Every 24 hours 07/29/19 1421 08/01/19 0716      Subjective  The patient level of alertness is again improved this morning. She is asking to have the side rails of her bed down so she can " get out  and move around". No new complaints.  Objective   Vitals:  Vitals:   08/14/19 0833 08/14/19 1211  BP: (!) 151/87 134/78  Pulse: 97 100  Resp: 18 18  Temp: 98.1 F (36.7 C) 98 F (36.7 C)  SpO2: 100% 98%   Exam:  Constitutional:  . The patient is awake and alert. Still somewhat confused. No acute distress.  Respiratory:  . No increased work of breathing. . No wheezes, rales, or rhonchi . No tactile fremitus Cardiovascular:  . Regular rate and rhythm . No murmurs, ectopy, or gallups. . No lateral PMI. No thrills. Abdomen:  . Abdomen is soft, non-tender, non-distended . No hernias, masses, or organomegaly . Normoactive bowel sounds.  Musculoskeletal:  . No cyanosis, clubbing, or edema Skin:  . No rashes, lesions, ulcers . palpation of skin: no induration or nodules Neurologic:  . CN 2-12 intact . Sensation all 4 extremities intact . Bilateral upper and lower extremity weakness Psychiatric:  . The patient is awake, alert, and oriented x 3. . No hallucination x 24 hours per patient.  I have personally reviewed the following:   Today's Data  . Vitals, CBC, BMP  Micro Data  . UCx (3/22) + for E. coli  Scheduled Meds: . antiseptic oral rinse  15 mL Mouth Rinse BID  . apixaban  5 mg Oral BID  . Chlorhexidine Gluconate Cloth  6 each Topical Daily  . clopidogrel  75 mg Oral Daily  . feeding supplement (ENSURE ENLIVE)  237 mL Oral QID  . hydrALAZINE  25 mg Oral Q8H  . insulin aspart  0-15 Units Subcutaneous TID WC  . insulin aspart  0-5 Units Subcutaneous QHS  . latanoprost  1 drop Both Eyes QHS  . lisinopril  20 mg Oral Daily  . mirtazapine  15 mg Oral QHS  . nicotine  21 mg Transdermal Daily  . nystatin  5 mL Oral QID  . sodium chloride flush  3 mL Intravenous Once   Continuous Infusions: . piperacillin-tazobactam (ZOSYN)  IV 3.375 g (08/14/19 0816)    Principal Problem:   Major depressive disorder Active Problems:   Essential hypertension   Type 2  diabetes mellitus without complication (HCC)   CVA (cerebral vascular accident) ([place])   Hyperlipidemia   Generalized weakness   Hypokalemia   Hyponatremia   Acute on chronic kidney failure (HCC)   Thrombocytopenia (HCC)   Elevated troponin   Lactic acidosis   AKI (acute kidney injury) ([place])   Palliative care by specialist   Goals of care, counseling/discussion   Xerostomia   Dry lips   Failure to thrive in adult   Physical debility   Malnutrition of moderate degree   Esophageal thrush (HCC)   Palliative care encounter   Pulmonary embolism (HCC)   DVT (deep venous thrombosis) ([place])   Thrush, oral   Colitis   LOS: 18 days   A & P  FTT/progressive weakness/poor oral intake with reported weight loss: Per sister patient started have strange behavior since  Christmas, she started eat less, But patient has been able to perform ADLs, even driving to work on March 15. She is now very weak not able to lift lower extremity against gravity,bilateral r arm are very weak as well. MRI of the brain was repeated and no acute findings were found. Neurology (Dr. Otelia Limes) consulted to assess movement disorder, possible myopathy. MRI spine no acute findings, per neurology Dr. 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 Due to anticoagulation for PE treatment, not able to perform LP safely. For this reason Dr. Otelia Limes recommended empiric treatment with IVIG. This has now completed. Neurology wants to reassess the patient on 08/15/2019 to see if she has continued to improve.   Anti GQ1b antibody ordered. Pending. I appreciate neurology's assistance.  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 Nutrition has been consulted and calorie count has been initiation. She has been started on Remeron for appetite  stimulant, continue nutrition supplement. Esophagram and orthopantogram ordered on March 27th, It does not appear to have done, presumably due to lethargy and does not take oral meds reliably. She has receiving IVIG treatment as per neurology. Nutrition has recommended a 48 hour calorie count to determine her ability to meet her nutritional needs with oral intake. If this proves to be insufficient, she will have to have a feeding tube placed, and PEG tube considered by family.   Ascending colon colitis, incidental finding on CT scan during malignancy work-up: Patient denies any nausea or vomiting and abdominal pain. Blood culture have shown no growth. She has been treated with 10 days of antibiotics including Rocephin , Zosyn then oral Augmentin. She was also started on Diflucan on March 29. It was discontinued on April 4.  Bilateral pulmonary embolism(incidental finding on ct chest during malignancy work-up) Patient was started on IV heparin. She has now been transitioned to oral Eliquis. Venous duplex of the lower extremities shows acute DVT involving the single left posterior tibial vein as well. Echocardiogram ordered showed left ventricular ejection fraction of 60 to 65% without any regional wall abnormalities. Left ventricular diastolic parameters are consistent with grade 1 diastolic dysfunction. No right heart strain seen.  Hematuria: UA has demonstrated triple phosphate crystals. Urine culture has been ordered. I will start the patient on IV Antibiotics to cover for klebsiella pneumonia and proteus species. Thus far repeat urine culture has had no growth.  Thrombocytopenia, acute on chronic: Platelet 88 on presentation 113 today. Likely due to PE and DVT  Urinary retention, Foley inserted on April 3. Removed today. Voiding trial ordered. Monitored.  AKI superimposed on stage II CKD: Creatinine 1.38 on presentation, today is 1.06. Repeat BMP in the morning. Monitor renal function.   Hypokalemia: supplement and monitor. remain low continue to replace K. Mag 2.0.  History of recurrent CVA: CVA on 12/25/2018 and 03/27/2019(has loop recorder). MRI brain obtained March 25 demonstrated no acute CVA. Continue Plavix and statin. She is on Eliquis now due to PE.  Constipation; appear resolved.  Psychosis: pt is having hallucinations. Will increase dose of Remeron.  Body mass index is 33.95 kg/m. Obesity complicates all cares.   First Covid vaccine  [employer] )on March 13. Second dose due on 4/7. The patient will have to have second dose given when she leaves the hospital.  The patient has been seen and examined by me. I have  spent 32 minutes in her evaluation and care.  DVT Prophylaxis: On Eliquis Code Status: DNR Family Communication: Sister over the phone with his permission on April 1 and on 4/3 Disposition Plan: The patient came from home. Anticipate discharge to SNF. Barriers to dc include patient is currently receiving IVIG, poor oral intake, and difficult to place due to lack of insurance.  Kielee Care, DO [facility] Direct contact: see [email or web address]  7PM-7AM contact night coverage as above 08/14/2019, 3:07 PM  LOS: 14 days

## 2019-08-14 NOTE — Progress Notes (Signed)
Physical Therapy Treatment Patient Details Name: Denise Santiago MRN: [id number] DOB: May 06, 1956 Today's Date: 08/14/2019    History of Present Illness 64 y.o. female with a hx of DM, HTN, HLD, CVA 12/2018 & 03/2019, ongoing tob use who presented to ED with generalized weakness and elevated troponin, thought to be non specific by cardiology. pt reports weakness after COVID vaccine 07/16/19. The day prior to admission pt slid out of chair to floor and remained through the night.     PT Comments    Patient seen for mobility progression. Pt initially alert and agreeable to participate in therapy. Pt became lethargic and kept eyes closed during session.  This session focused on bed level mobility given increased risk of fall from chair given lethargy, increased tone, and ataxic movements. Continue to progress as tolerated with anticipated d/c to SNF for further skilled PT services.     Follow Up Recommendations  Supervision/Assistance - 24 hour;SNF     Equipment Recommendations  None recommended by PT    Recommendations for Other Services       Precautions / Restrictions Precautions Precautions: Fall Precaution Comments: reports falls since COVID vaccine Restrictions Weight Bearing Restrictions: No    Mobility  Bed Mobility Overal bed mobility: Needs Assistance Bed Mobility: Rolling Rolling: Max assist;+2 for physical assistance   Supine to sit: Mod assist;Max assist;+2 for physical assistance     General bed mobility comments: hand over hand assist to reach for rails; multimodal cues for sequencing; after use of bed pan worked on long sitting in bed with HOB slightly elevated attempted to use side rails however pt unable to maintain bilat hand grip so held onto hands of therapist and tech  Transfers                    Ambulation/Gait                 Stairs             Wheelchair Mobility    Modified Rankin (Stroke Patients Only) Modified Rankin  (Stroke Patients Only) Pre-Morbid Rankin Score: No significant disability Modified Rankin: Severe disability     Balance                                            Cognition Arousal/Alertness: Lethargic Behavior During Therapy: Flat affect Overall Cognitive Status: No family/caregiver present to determine baseline cognitive functioning Area of Impairment: Attention;Memory;Following commands;Safety/judgement;Awareness;Problem solving                   Current Attention Level: Sustained Memory: Decreased short-term memory Following Commands: Follows one step commands with increased time;Follows one step commands inconsistently Safety/Judgement: Decreased awareness of safety;Decreased awareness of deficits Awareness: Intellectual Problem Solving: Slow processing;Decreased initiation;Difficulty sequencing;Requires verbal cues;Requires tactile cues General Comments: pt initially interactive/talkative and then kept eyes closed most of rest of session and speaking minimally       Exercises General Exercises - Upper Extremity Shoulder Flexion: AROM;Both Elbow Flexion: AROM;Both General Exercises - Lower Extremity Ankle Circles/Pumps: AROM;Both Heel Slides: AAROM    General Comments        Pertinent Vitals/Pain Pain Assessment: No/denies pain    Home Living                      Prior Function  PT Goals (current goals can now be found in the care plan section) Progress towards PT goals: Progressing toward goals    Frequency    Min 3X/week      PT Plan Current plan remains appropriate    Co-evaluation              AM-PAC PT "6 Clicks" Mobility   Outcome Measure  Help needed turning from your back to your side while in a flat bed without using bedrails?: Total Help needed moving from lying on your back to sitting on the side of a flat bed without using bedrails?: Total Help needed moving to and from a bed to a  chair (including a wheelchair)?: Total Help needed standing up from a chair using your arms (e.g., wheelchair or bedside chair)?: Total Help needed to walk in hospital room?: Total Help needed climbing 3-5 steps with a railing? : Total 6 Click Score: 6    End of Session   Activity Tolerance: Patient limited by fatigue;Patient limited by lethargy Patient left: with call bell/phone within reach;in bed;with bed alarm set Nurse Communication: Mobility status;Need for lift equipment PT Visit Diagnosis: Muscle weakness (generalized) (M62.81);Difficulty in walking, not elsewhere classified (R26.2)     Time: 6734-1937 PT Time Calculation (min) (ACUTE ONLY): 21 min  Charges:  $Therapeutic Activity: 8-22 mins                     Earney Navy, PTA Acute Rehabilitation Services Pager: [phone] Office: [phone]     Darliss Cheney 08/14/2019, 4:58 PM

## 2019-08-14 NOTE — Progress Notes (Signed)
Nutrition Brief Note  Discussed pt with MD. Calorie count ordered for pt. RD will follow-up with results on Monday. Medications and labs reviewed. Pt currently has orders for Ensure Enlive QID.  Calorie Count Instructions: Please hang calorie count envelope on the patient's door. Document percent consumed for each item on the patient's meal tray ticket and keep in envelope. Also document percent of any supplement or snack pt consumes and keep documentation in envelope for RD to review.    Eugene Gavia, MS, RD, LDN RD pager number and weekend/on-call pager number located in [place].

## 2019-08-15 LAB — GLUCOSE, CAPILLARY
Glucose-Capillary: 150 mg/dL — ABNORMAL HIGH (ref 70–99)
Glucose-Capillary: 103 mg/dL — ABNORMAL HIGH (ref 70–99)
Glucose-Capillary: 132 mg/dL — ABNORMAL HIGH (ref 70–99)
Glucose-Capillary: 97 mg/dL (ref 70–99)

## 2019-08-15 NOTE — Progress Notes (Addendum)
NEUROLOGY PROGRESS NOTE   Subjective: Patient awake, alert, NAD. Improved exam from last weekend.   Exam: Vitals:   08/15/19 0347 08/15/19 0732  BP: 125/68 (!) 148/79  Pulse: (!) 104 (!) 102  Resp: 17 18  Temp: 98.4 F (36.9 C) 97.8 F (36.6 C)  SpO2: 92% 96%    Physical Exam  Constitutional: Appears well-developed and well-nourished.  Eyes: No scleral injection HENT: No OP obstruction, mucous membranes dry Head: Normocephalic.  Cardiovascular: Normal rate and regular rhythm.  Respiratory: Effort normal, non-labored breathing GI: Soft.  No distension. There is no tenderness.  Skin: WDI   Mental Status: Alert, oriented, thought content appropriate.  Speech fluent without evidence of aphasia.  Able to follow  commands without difficulty. Cranial Nerves: II: visual fields grossly normal, pupils equal, round, reactive to light and accommodation III,IV, VI: ptosis not present, extraocular muscles extra-ocular motions intact bilaterally V,VII: smile symmetric, facial light touch sensation normal bilaterally VIII: hearing normal bilaterally IX,X: gag reflex present XI: trapezius strength/neck flexion strength normal bilaterally XII: tongue midline but does not open mouth wide Motor: Right : Upper extremity    Left:     Upper extremity 5/5 deltoid       5/5 deltoid 4/5 tricep      4-/5 tricep 5/5 biceps     5/5 biceps  5/5wrist flexion     5/5 wrist flexion 5/5 wrist extension     5/5 wrist extension 5/5 hand grip      5/5 hand grip  Lower extremity     Lower extremity 3/5 hip flexor     3/5 hip flexor Unable to raise bilateral legs outstretched off bed secondary to pain and weakness 4/5 quadricep     4/5 quadriceps  4-/5 hamstrings    4-/5 hamstrings 4/5 plantar flexion      5/5 plantar flexion 5/5 plantar extension    5/5 plantar extension -Tremor in bilateral arms and hands with arms held out straight  Sensory: cool temp and light touch intact throughout upper  extremity.  Lower extremities she has decreased cool temperature, and light touch sensation up to her knee.   Deep Tendon Reflexes:   1+ brachioradialis bilaterally .  0-1+ patellar bilaterally Plantars: Right: downgoing   Left: downgoing Cerebellar: Dysmetria with FNF bilaterally, Unable to do heel-to-shin secondary to weakness and discomfort.    Medications:  Scheduled:  antiseptic oral rinse  15 mL Mouth Rinse BID   apixaban  5 mg Oral BID   Chlorhexidine Gluconate Cloth  6 each Topical Daily   clopidogrel  75 mg Oral Daily   feeding supplement (ENSURE ENLIVE)  237 mL Oral QID   hydrALAZINE  25 mg Oral Q8H   insulin aspart  0-15 Units Subcutaneous TID WC   insulin aspart  0-5 Units Subcutaneous QHS   latanoprost  1 drop Both Eyes QHS   lisinopril  20 mg Oral Daily   mirtazapine  15 mg Oral QHS   nicotine  21 mg Transdermal Daily   nystatin  5 mL Oral QID   sodium chloride flush  3 mL Intravenous Once   Continuous:  piperacillin-tazobactam (ZOSYN)  IV 3.375 g (08/15/19 0347)    Pertinent Labs/Diagnostics: -Platelets 108 -Anti-GQ 1 pending release  Laurey Morale, MSN, NP-C [facility] [phone]   Assessment:  This 64 year old female with progressive bilateral upper and lower extremity weakness.  Unfortunately was unable to get an LP secondary to history of PE and on apixaban.  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  For that reason patient was placed on IVIG for 5 days.  Exam today does show increased strength  Recommendations: -continue PT/OT --Follow-up GQ 1B antibodies  08/15/2019, 9:42 AM    NEUROHOSPITALIST ADDENDUM Performed a face to face diagnostic evaluation.   I have reviewed the contents of history and physical exam as documented by PA/ARNP/Resident and agree with above documentation.  I have discussed and formulated the above plan as documented. Edits to the  note have been made as needed.   Patient continues to make progress with improved strength compared to my assessment on Wednesday.  Continue PT OT.  GQ 1B antibody still pending.   Georgiana Spinner Tyreisha Ungar MD [facility] [phone]   If 7pm to 7am, please call on call as listed on AMION.

## 2019-08-15 NOTE — Progress Notes (Signed)
PROGRESS NOTE  Denise Santiago DXI:[id number] DOB: [birth year] DOA: 07/26/2019 PCP: Seward Carol, MD  Brief History   Denise Santiago a 64 y.o.femalewith medical history significant ofhypertension, diabetes mellitus, hyperlipidemia, CVA on 12/25/2018 and 03/27/2019(has loop recorder), tobacco abuse presents to emergency department due to generalized weakness.  Patient tells me that she received Covid vaccine last Saturday and since then she has been feeling weak, lethargic, has no energy and has decreased appetite. Yesterday she slid out of her chair onto the floor and was not able to get up due to generalized weakness. She stayed on the floor all night long. Her niece was with her at that time but she could not get her up as well. EMS was called this morning and brought here to the emergency department for further evaluation and management.  Patient denies headache, blurry vision, slurred speech, head trauma, loss of consciousness, seizures, chest pain, shortness of breath, palpitation, leg swelling, fever, chills, nausea, vomiting, diarrhea, cough, congestion, wheezing, dysuria or any other urinary symptoms.  She lives alone and uses walker and cane for ambulation. She continues to smoke 1 pack of cigarettes per day however denies alcohol, recent drug use.  As per the sister, patient has lost more than 50 pounds since November after her last stroke.On further questioning, pt reports that she has altered taste and no appetite whatso ever, along with loose teeth , preventing her to eat any solid food. Malignancy work up has been initiated and CT abd , pelvis and chest with contrast ordered  On 3/40forfurther evaluation. CT of the chest with contrast showed acute bilateral pulmonary embolism and CT of the abdomen showed ascending colon colitis.   Per sister patient started have strange behavior since Christmas, she started to eat less, But patient has been able to perform ADLs,  even driving to work on March 15.  The patient has been very drowsy and weak. She has a very poor PO intake. Nutrition has recommended a 48 hour calorie count over the weekend to determine her ability to meet her nutritional needs through her oral intake.   The patient has completed 5 days of IVIG, and has demonstrated improved strength. Continue PT/OT.  She received her first Covid vaccine on March 13, second dose scheduled on April 7.  Consultants  . Neurology . Dentistry . Palliative care . Psychiatry . Cardiology  Procedures  . None  Antibiotics   Anti-infectives (From admission, onward)   Start     Dose/Rate Route Frequency Ordered Stop   08/13/19 2330  piperacillin-tazobactam (ZOSYN) IVPB 3.375 g  Status:  Discontinued     3.375 g 12.5 mL/hr over 240 Minutes Intravenous Every 8 hours 08/13/19 1610 08/15/19 1249   08/13/19 1530  piperacillin-tazobactam (ZOSYN) IVPB 3.375 g  Status:  Discontinued     3.375 g 12.5 mL/hr over 240 Minutes Intravenous Every 8 hours 08/13/19 1412 08/13/19 1610   08/04/19 1015  amoxicillin-clavulanate (AUGMENTIN) 250-62.5 MG/5ML suspension 500 mg     500 mg Oral Every 12 hours 08/04/19 0844 08/08/19 2139   08/03/19 2000  fluconazole (DIFLUCAN) IVPB 100 mg  Status:  Discontinued     100 mg 50 mL/hr over 60 Minutes Intravenous Every 24 hours 08/03/19 1802 08/09/19 1813   08/01/19 1715  piperacillin-tazobactam (ZOSYN) IVPB 3.375 g  Status:  Discontinued     3.375 g 12.5 mL/hr over 240 Minutes Intravenous Every 8 hours 08/01/19 1712 08/04/19 0844   07/30/19 0000  cefTRIAXone (ROCEPHIN) 1 g in sodium chloride 0.9 %  100 mL IVPB  Status:  Discontinued     1 g 200 mL/hr over 30 Minutes Intravenous Every 24 hours 07/29/19 1420 07/29/19 1421   07/29/19 1421  cefTRIAXone (ROCEPHIN) 1 g in sodium chloride 0.9 % 100 mL IVPB  Status:  Discontinued     1 g 200 mL/hr over 30 Minutes Intravenous Every 24 hours 07/29/19 1421 08/01/19 0716      Subjective   The patient is resting comfortably in bed. No new complaints.  Objective   Vitals:  Vitals:   08/15/19 1216 08/15/19 1545  BP: (!) 154/85 (!) 115/52  Pulse: 97 (!) 103  Resp: 18 18  Temp: 99 F (37.2 C) 98.6 F (37 C)  SpO2: 100% 94%   Exam:  Constitutional:  . The patient is awake and alert. More oriented. No acute distress.  Respiratory:  . No increased work of breathing. . No wheezes, rales, or rhonchi . No tactile fremitus Cardiovascular:  . Regular rate and rhythm . No murmurs, ectopy, or gallups. . No lateral PMI. No thrills. Abdomen:  . Abdomen is soft, non-tender, non-distended . No hernias, masses, or organomegaly . Normoactive bowel sounds.  Musculoskeletal:  . No cyanosis, clubbing, or edema Skin:  . No rashes, lesions, ulcers . palpation of skin: no induration or nodules Neurologic:  . CN 2-12 intact . Sensation all 4 extremities intact . Bilateral upper and lower extremity weakness Psychiatric:  . The patient is awake, alert, and oriented x 3. . No hallucination x 24 hours per patient.  I have personally reviewed the following:   Today's Data  . Vitals, CBC, BMP  Micro Data  . UCx (3/22) + for E. coli  Scheduled Meds: . antiseptic oral rinse  15 mL Mouth Rinse BID  . apixaban  5 mg Oral BID  . Chlorhexidine Gluconate Cloth  6 each Topical Daily  . clopidogrel  75 mg Oral Daily  . feeding supplement (ENSURE ENLIVE)  237 mL Oral QID  . hydrALAZINE  25 mg Oral Q8H  . insulin aspart  0-15 Units Subcutaneous TID WC  . insulin aspart  0-5 Units Subcutaneous QHS  . latanoprost  1 drop Both Eyes QHS  . lisinopril  20 mg Oral Daily  . mirtazapine  15 mg Oral QHS  . nicotine  21 mg Transdermal Daily  . nystatin  5 mL Oral QID  . sodium chloride flush  3 mL Intravenous Once   Continuous Infusions:   Principal Problem:   Major depressive disorder Active Problems:   Essential hypertension   Type 2 diabetes mellitus without complication (HCC)    CVA (cerebral vascular accident) (HCC)   Hyperlipidemia   Generalized weakness   Hypokalemia   Hyponatremia   Acute on chronic kidney failure (HCC)   Thrombocytopenia (HCC)   Elevated troponin   Lactic acidosis   AKI (acute kidney injury) (HCC)   Palliative care by specialist   Goals of care, counseling/discussion   Xerostomia   Dry lips   Failure to thrive in adult   Physical debility   Malnutrition of moderate degree   Esophageal thrush (HCC)   Palliative care encounter   Pulmonary embolism (HCC)   DVT (deep venous thrombosis) (HCC)   Thrush, oral   Colitis   LOS: 19 days   A & P  FTT/progressive weakness/poor oral intake with reported weight loss: Per sister patient started have strange behavior since Christmas, she started eat less, But patient has been able to perform ADLs, even  driving to work on March 15. She is now very weak not able to lift lower extremity against gravity,bilateral r arm are very weak as well. MRI of the brain was repeated and no acute findings were found. Neurology (Dr. Otelia Limes) consulted to assess movement disorder, possible myopathy. MRI spine no acute findings, per neurology Dr. 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 Due to anticoagulation for PE treatment, not able to perform LP safely. For this reason Dr. Otelia Limes recommended empiric treatment with IVIG. This has now completed. Neurology has reassessed the patient today. They feel that she has improved and recommends PT/OT. I appreciate neurology's assistance.  Anti GQ1b antibody ordered. Pending. I appreciate neurology's assistance.  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 Nutrition has been consulted and calorie count has been initiation. She has been started on Remeron for appetite  stimulant, continue nutrition supplement. Esophagram and orthopantogram ordered on March 27th, It does not appear to have done, presumably due to lethargy and does not take oral meds reliably. She has receiving IVIG treatment as per neurology. Nutrition has recommended a 48 hour calorie count to determine her ability to meet her nutritional needs with oral intake. If this proves to be insufficient, she will have to have a feeding tube placed, and PEG tube considered by family.   Ascending colon colitis, incidental finding on CT scan during malignancy work-up: Patient denies any nausea or vomiting and abdominal pain. Blood culture have shown no growth. She has been treated with 10 days of antibiotics including Rocephin , Zosyn then oral Augmentin. She was also started on Diflucan on March 29. It was discontinued on April 4.  Bilateral pulmonary embolism(incidental finding on ct chest during malignancy work-up) Patient was started on IV heparin. She has now been transitioned to oral Eliquis. Venous duplex of the lower extremities shows acute DVT involving the single left posterior tibial vein as well. Echocardiogram ordered showed left ventricular ejection fraction of 60 to 65% without any regional wall abnormalities. Left ventricular diastolic parameters are consistent with grade 1 diastolic dysfunction. No right heart strain seen.  Hematuria: UA has demonstrated triple phosphate crystals. Urine culture has been ordered. I will start the patient on IV Antibiotics to cover for klebsiella pneumonia and proteus species. Thus far repeat urine culture has had no growth.  Thrombocytopenia, acute on chronic: Platelet 88 on presentation 113 on 08/14/2019. Likely due to PE and DVT  Urinary retention, Foley inserted on April 3. Removed today. Voiding trial ordered. Monitored.  AKI superimposed on stage II CKD: Creatinine 1.38 on presentation, 08/14/2019 is 0.76. Repeat BMP in the morning. Monitor renal  function.  Hypokalemia: supplement and monitor. remain low continue to replace K. Mag 2.0.  History of recurrent CVA: CVA on 12/25/2018 and 03/27/2019(has loop recorder). MRI brain obtained March 25 demonstrated no acute CVA. Continue Plavix and statin. She is on Eliquis now due to PE.  Constipation; appear resolved.  Psychosis: pt is having hallucinations. Will increase dose of Remeron.  Body mass index is 33.95 kg/m. Obesity complicates all cares.   First Covid vaccine  [employer] )on March 13. Second dose due on 4/7. The patient will have to have second dose given when she leaves the hospital.  The patient has been seen and examined by me. I have spent 30 minutes in her evaluation and care.  DVT Prophylaxis:  On Eliquis Code Status: DNR Family Communication: None present. Disposition Plan: The patient came from home. Anticipate discharge to SNF. Barriers to dc include patient is currently receiving IVIG, poor oral intake, and difficult to place due to lack of insurance.  Amberley Hamler, DO [facility] Direct contact: see [email or web address]  7PM-7AM contact night coverage as above 08/15/2019, 4:29 PM  LOS: 14 days

## 2019-08-15 NOTE — Progress Notes (Signed)
   Palliative Medicine Inpatient Follow Up Note   HPI: Per H&P --> Denise Hamiltonis a 64 y.o.femalewith medical history significant ofhypertension, diabetes mellitus, hyperlipidemia, CVA on 12/25/2018 and 03/27/2019(has loop recorder), tobacco abuse presents to emergency department due to generalized weakness.  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  For that reason patient was placed on IVIG for 5 days.   Palliative care had been consulted to address goals of care.  Today's Discussion (08/14/2019): I met with Denise Santiago, she was in cheerful spirts. She endorsed that the pain in her mouth was better. Patient is hopeful for continued strengthening and to get back home.   Discussed with patient the importance of continued conversation with family and their  medical providers regarding overall plan of care and treatment options, ensuring decisions are within the context of the patients values and GOCs.  Questions and concerns addressed   Vital Signs Vitals:   08/14/19 2332 08/15/19 0347  BP: (!) 148/73 125/68  Pulse: (!) 105 (!) 104  Resp: 18 17  Temp: 99.1 F (37.3 C) 98.4 F (36.9 C)  SpO2: 93% 92%    Intake/Output Summary (Last 24 hours) at 08/15/2019 0644 Last data filed at 08/14/2019 1800 Gross per 24 hour  Intake 231.69 ml  Output 500 ml  Net -268.31 ml   Last Weight  Most recent update: 08/14/2019  6:06 AM   Weight  84.5 kg (186 lb 4.6 oz)            Physical Exam Vitals and nursing note reviewed.  HENT:     Head: Normocephalic.     Nose: Nose normal.     Mouth/Throat:     Mouth: Mucous membranes are dry.  Eyes:     Pupils: Pupils are equal, round, and reactive to light.  Cardiovascular:     Rate and Rhythm: Normal rate and regular rhythm.  Pulmonary:     Effort: Pulmonary effort is normal.  Abdominal:     Palpations: Abdomen is soft.  Skin:    General:  Skin is dry.     Capillary Refill: Capillary refill takes less than 2 seconds.  Neurological:     Mental Status: She is alert and oriented to person, place, and time.  Psychiatric:        Mood and Affect: Mood normal.   SUMMARY OF RECOMMENDATIONS   DNAR/DNI  Treat what is treatable  MOST completed --> On chart  DNR completed --> On chart  TOC --> OP Palliative  TOC --> Help patient get life alert band  Plan for SNF placement  Time Spent: 25 Greater than 50% of the time was spent in counseling and coordination of care ______________________________________________________________________________________ [place] Team Team Cell Phone: [phone] Please utilize secure chat with additional questions, if there is no response within 30 minutes please call the above phone number  Palliative Medicine Team providers are available by phone from 7am to 7pm daily and can be reached through the team cell phone.  Should this patient require assistance outside of these hours, please call the patient's attending physician.

## 2019-08-16 LAB — GLUCOSE, CAPILLARY
Glucose-Capillary: 100 mg/dL — ABNORMAL HIGH (ref 70–99)
Glucose-Capillary: 95 mg/dL (ref 70–99)
Glucose-Capillary: 115 mg/dL — ABNORMAL HIGH (ref 70–99)
Glucose-Capillary: 91 mg/dL (ref 70–99)
Glucose-Capillary: 87 mg/dL (ref 70–99)

## 2019-08-16 NOTE — Plan of Care (Signed)
Patient stable, discussed POC with patient and family, agreeable with plan, denies question/concerns at this time.  

## 2019-08-16 NOTE — Progress Notes (Signed)
PROGRESS NOTE  Denise Santiago DXI:[id number] DOB: [birth year] DOA: 07/26/2019 PCP: Seward Carol, MD  Brief History   Denise Hamiltonis a 64 y.o.femalewith medical history significant ofhypertension, diabetes mellitus, hyperlipidemia, CVA on 12/25/2018 and 03/27/2019(has loop recorder), tobacco abuse presents to emergency department due to generalized weakness.  Patient tells me that she received Covid vaccine last Saturday and since then she has been feeling weak, lethargic, has no energy and has decreased appetite. Yesterday she slid out of her chair onto the floor and was not able to get up due to generalized weakness. She stayed on the floor all night long. Her niece was with her at that time but she could not get her up as well. EMS was called this morning and brought here to the emergency department for further evaluation and management.  Patient denies headache, blurry vision, slurred speech, head trauma, loss of consciousness, seizures, chest pain, shortness of breath, palpitation, leg swelling, fever, chills, nausea, vomiting, diarrhea, cough, congestion, wheezing, dysuria or any other urinary symptoms.  She lives alone and uses walker and cane for ambulation. She continues to smoke 1 pack of cigarettes per day however denies alcohol, recent drug use.  As per the sister, patient has lost more than 50 pounds since November after her last stroke.On further questioning, pt reports that she has altered taste and no appetite whatso ever, along with loose teeth , preventing her to eat any solid food. Malignancy work up has been initiated and CT abd , pelvis and chest with contrast ordered  On 3/40forfurther evaluation. CT of the chest with contrast showed acute bilateral pulmonary embolism and CT of the abdomen showed ascending colon colitis.   Per sister patient started have strange behavior since Christmas, she started to eat less, But patient has been able to perform ADLs,  even driving to work on March 15.  The patient has been very drowsy and weak. She has a very poor PO intake. Nutrition has recommended a 48 hour calorie count over the weekend to determine her ability to meet her nutritional needs through her oral intake.   The patient has completed 5 days of IVIG, and has demonstrated improved strength. Continue PT/OT.  She received her first Covid vaccine on March 13, second dose scheduled on April 7.  Consultants  . Neurology . Dentistry . Palliative care . Psychiatry . Cardiology  Procedures  . None  Antibiotics   Anti-infectives (From admission, onward)   Start     Dose/Rate Route Frequency Ordered Stop   08/13/19 2330  piperacillin-tazobactam (ZOSYN) IVPB 3.375 g  Status:  Discontinued     3.375 g 12.5 mL/hr over 240 Minutes Intravenous Every 8 hours 08/13/19 1610 08/15/19 1249   08/13/19 1530  piperacillin-tazobactam (ZOSYN) IVPB 3.375 g  Status:  Discontinued     3.375 g 12.5 mL/hr over 240 Minutes Intravenous Every 8 hours 08/13/19 1412 08/13/19 1610   08/04/19 1015  amoxicillin-clavulanate (AUGMENTIN) 250-62.5 MG/5ML suspension 500 mg     500 mg Oral Every 12 hours 08/04/19 0844 08/08/19 2139   08/03/19 2000  fluconazole (DIFLUCAN) IVPB 100 mg  Status:  Discontinued     100 mg 50 mL/hr over 60 Minutes Intravenous Every 24 hours 08/03/19 1802 08/09/19 1813   08/01/19 1715  piperacillin-tazobactam (ZOSYN) IVPB 3.375 g  Status:  Discontinued     3.375 g 12.5 mL/hr over 240 Minutes Intravenous Every 8 hours 08/01/19 1712 08/04/19 0844   07/30/19 0000  cefTRIAXone (ROCEPHIN) 1 g in sodium chloride 0.9 %  100 mL IVPB  Status:  Discontinued     1 g 200 mL/hr over 30 Minutes Intravenous Every 24 hours 07/29/19 1420 07/29/19 1421   07/29/19 1421  cefTRIAXone (ROCEPHIN) 1 g in sodium chloride 0.9 % 100 mL IVPB  Status:  Discontinued     1 g 200 mL/hr over 30 Minutes Intravenous Every 24 hours 07/29/19 1421 08/01/19 0716      Subjective    The patient is resting comfortably in bed. No new complaints.  Objective   Vitals:  Vitals:   08/16/19 0840 08/16/19 1143  BP: 138/62 (!) 101/52  Pulse: 97 93  Resp: 18 18  Temp: 98.6 F (37 C) 99.4 F (37.4 C)  SpO2: 93% 100%   Exam:  Constitutional:  . The patient is awake and alert. More oriented. No acute distress.  Respiratory:  . No increased work of breathing. . No wheezes, rales, or rhonchi . No tactile fremitus Cardiovascular:  . Regular rate and rhythm . No murmurs, ectopy, or gallups. . No lateral PMI. No thrills. Abdomen:  . Abdomen is soft, non-tender, non-distended . No hernias, masses, or organomegaly . Normoactive bowel sounds.  Musculoskeletal:  . No cyanosis, clubbing, or edema Skin:  . No rashes, lesions, ulcers . palpation of skin: no induration or nodules Neurologic:  . CN 2-12 intact . Sensation all 4 extremities intact . Bilateral upper and lower extremity weakness Psychiatric:  . The patient is awake, alert, and oriented x 3. . No hallucination x 24 hours per patient.  I have personally reviewed the following:   Today's Data  . Vitals, CBC, BMP  Micro Data  . UCx (3/22) + for E. coli  Scheduled Meds: . antiseptic oral rinse  15 mL Mouth Rinse BID  . apixaban  5 mg Oral BID  . Chlorhexidine Gluconate Cloth  6 each Topical Daily  . clopidogrel  75 mg Oral Daily  . feeding supplement (ENSURE ENLIVE)  237 mL Oral QID  . hydrALAZINE  25 mg Oral Q8H  . insulin aspart  0-15 Units Subcutaneous TID WC  . insulin aspart  0-5 Units Subcutaneous QHS  . latanoprost  1 drop Both Eyes QHS  . lisinopril  20 mg Oral Daily  . mirtazapine  15 mg Oral QHS  . nicotine  21 mg Transdermal Daily  . nystatin  5 mL Oral QID  . sodium chloride flush  3 mL Intravenous Once   Continuous Infusions:   Principal Problem:   Major depressive disorder Active Problems:   Essential hypertension   Type 2 diabetes mellitus without complication (HCC)    CVA (cerebral vascular accident) (HCC)   Hyperlipidemia   Generalized weakness   Hypokalemia   Hyponatremia   Acute on chronic kidney failure (HCC)   Thrombocytopenia (HCC)   Elevated troponin   Lactic acidosis   AKI (acute kidney injury) (HCC)   Palliative care by specialist   Goals of care, counseling/discussion   Xerostomia   Dry lips   Failure to thrive in adult   Physical debility   Malnutrition of moderate degree   Esophageal thrush (HCC)   Palliative care encounter   Pulmonary embolism (HCC)   DVT (deep venous thrombosis) (HCC)   Thrush, oral   Colitis   LOS: 20 days   A & P  FTT/progressive weakness/poor oral intake with reported weight loss: Per sister patient started have strange behavior since Christmas, she started eat less, But patient has been able to perform ADLs, even driving  to work on March 15. She is now very weak not able to lift lower extremity against gravity,bilateral r arm are very weak as well. MRI of the brain was repeated and no acute findings were found. Neurology (Dr. Otelia Limes) consulted to assess movement disorder, possible myopathy. MRI spine no acute findings, per neurology Dr. 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 Due to anticoagulation for PE treatment, not able to perform LP safely. For this reason Dr. Otelia Limes recommended empiric treatment with IVIG. This has now completed. Neurology has reassessed the patient today. They feel that she has improved and recommends PT/OT. I appreciate neurology's assistance.  Anti GQ1b antibody ordered. Pending. I appreciate neurology's assistance.  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 Nutrition has been consulted and calorie count has been initiation. She has been started on Remeron for appetite  stimulant, continue nutrition supplement. Esophagram and orthopantogram ordered on March 27th, It does not appear to have done, presumably due to lethargy and does not take oral meds reliably. She has receiving IVIG treatment as per neurology. Nutrition has recommended a 48 hour calorie count to determine her ability to meet her nutritional needs with oral intake. If this proves to be insufficient, she will have to have a feeding tube placed, and PEG tube considered by family.   Ascending colon colitis, incidental finding on CT scan during malignancy work-up: Patient denies any nausea or vomiting and abdominal pain. Blood culture have shown no growth. She has been treated with 10 days of antibiotics including Rocephin , Zosyn then oral Augmentin. She was also started on Diflucan on March 29. It was discontinued on April 4.  Bilateral pulmonary embolism(incidental finding on ct chest during malignancy work-up) Patient was started on IV heparin. She has now been transitioned to oral Eliquis. Venous duplex of the lower extremities shows acute DVT involving the single left posterior tibial vein as well. Echocardiogram ordered showed left ventricular ejection fraction of 60 to 65% without any regional wall abnormalities. Left ventricular diastolic parameters are consistent with grade 1 diastolic dysfunction. No right heart strain seen.  Hematuria: UA has demonstrated triple phosphate crystals. Urine culture has been ordered. I will start the patient on IV Antibiotics to cover for klebsiella pneumonia and proteus species. Thus far repeat urine culture has had no growth.  Thrombocytopenia, acute on chronic: Platelet 88 on presentation 113 on 08/14/2019. Likely due to PE and DVT  Urinary retention, Foley inserted on April 3. Removed today. Voiding trial ordered. Monitored.  AKI superimposed on stage II CKD: Creatinine 1.38 on presentation, 08/14/2019 is 0.76. Repeat BMP in the morning. Monitor renal  function.  Hypokalemia: supplement and monitor. remain low continue to replace K. Mag 2.0.  History of recurrent CVA: CVA on 12/25/2018 and 03/27/2019(has loop recorder). MRI brain obtained March 25 demonstrated no acute CVA. Continue Plavix and statin. She is on Eliquis now due to PE.  Constipation; appear resolved.  Psychosis: pt is having hallucinations. Will increase dose of Remeron.  Body mass index is 33.95 kg/m. Obesity complicates all cares.   First Covid vaccine  [employer] )on March 13. Second dose due on 4/7. The patient will have to have second dose given when she leaves the hospital.  The patient has been seen and examined by me. I have spent 30 minutes in her evaluation and care.  DVT Prophylaxis: On  Eliquis Code Status: DNR Family Communication: None present. Disposition Plan: The patient came from home. Anticipate discharge to SNF vs [facility] PT with 24 /7/ supervision. Barriers to dc include patient is currently receiving IVIG, poor oral intake, and difficult to place due to lack of insurance.  Denise Duba, DO [facility] Direct contact: see [email or web address]  7PM-7AM contact night coverage as above 08/16/2019, 2:13 PM  LOS: 14 days

## 2019-08-17 LAB — CBC
HCT: 33.2 % — ABNORMAL LOW (ref 36.0–46.0)
Hemoglobin: 10.8 g/dL — ABNORMAL LOW (ref 12.0–15.0)
MCH: 30 pg (ref 26.0–34.0)
MCHC: 32.5 g/dL (ref 30.0–36.0)
MCV: 92.2 fL (ref 80.0–100.0)
Platelets: 145 10*3/uL — ABNORMAL LOW (ref 150–400)
RBC: 3.6 MIL/uL — ABNORMAL LOW (ref 3.87–5.11)
RDW: 15.2 % (ref 11.5–15.5)
WBC: 5.9 10*3/uL (ref 4.0–10.5)
nRBC: 0.3 % — ABNORMAL HIGH (ref 0.0–0.2)

## 2019-08-17 LAB — BASIC METABOLIC PANEL
Anion gap: 9 (ref 5–15)
BUN: 13 mg/dL (ref 8–23)
CO2: 27 mmol/L (ref 22–32)
Calcium: 8.6 mg/dL — ABNORMAL LOW (ref 8.9–10.3)
Chloride: 105 mmol/L (ref 98–111)
Creatinine, Ser: 0.96 mg/dL (ref 0.44–1.00)
GFR calc Af Amer: >60 mL/min (ref >60)
GFR calc non Af Amer: >60 mL/min (ref >60)
Glucose, Bld: 111 mg/dL — ABNORMAL HIGH (ref 70–99)
Potassium: 3.2 mmol/L — ABNORMAL LOW (ref 3.5–5.1)
Sodium: 141 mmol/L (ref 135–145)

## 2019-08-17 LAB — GLUCOSE, CAPILLARY
Glucose-Capillary: 102 mg/dL — ABNORMAL HIGH (ref 70–99)
Glucose-Capillary: 101 mg/dL — ABNORMAL HIGH (ref 70–99)
Glucose-Capillary: 116 mg/dL — ABNORMAL HIGH (ref 70–99)
Glucose-Capillary: 107 mg/dL — ABNORMAL HIGH (ref 70–99)

## 2019-08-17 MED ORDER — ENSURE ENLIVE PO LIQD
237.0000 mL | Freq: Four times a day (QID) | ORAL | Status: DC
  Administered 2019-08-17 – 2019-09-04 (×54): 237 mL via ORAL
  Filled 2019-08-17 (×2): qty 237

## 2019-08-17 MED ORDER — POTASSIUM CHLORIDE 20 MEQ PO PACK
40.0000 meq | PACK | Freq: Once | ORAL | Status: AC
  Administered 2019-08-17: 13:00:00 40 meq via ORAL
  Filled 2019-08-17: qty 2

## 2019-08-17 MED ORDER — POTASSIUM CHLORIDE 10 MEQ/100ML IV SOLN: 10.0000 meq | INTRAVENOUS | Status: DC

## 2019-08-17 MED ORDER — DRONABINOL 2.5 MG PO CAPS
2.5000 mg | ORAL_CAPSULE | Freq: Two times a day (BID) | ORAL | Status: DC
  Administered 2019-08-17 – 2019-09-04 (×33): 2.5 mg via ORAL
  Filled 2019-08-17 (×33): qty 1

## 2019-08-17 NOTE — Progress Notes (Signed)
Physical Therapy Treatment Patient Details Name: Denise Santiago MRN: [id number] DOB: [birth year] Today's Date: 08/17/2019    History of Present Illness 64 y.o. female with a hx of DM, HTN, HLD, CVA 12/2018 & 03/2019, ongoing tob use who presented to ED with generalized weakness and elevated troponin, thought to be non specific by cardiology. pt reports weakness after COVID vaccine 07/16/19. The day prior to admission pt slid out of chair to floor and remained through the night.     PT Comments    Patient seen for mobility progression. Pt awake/alert and oriented to person/place upon arrival. As with previous sessions pt became less interactive/talkative with therapists after beginning to mobilize and kept eyes closed most of session unless given cues to keep them open. Tangential thoughts noted during session. Pt presents with decreased extensor tone but continues to be ataxic and demonstrates poor proprioception. Pt with decreased use of L UE and heavy L lateral bias in sitting EOB. PT will continues to follow acutely and progress as tolerated.    Follow Up Recommendations  Supervision/Assistance - 24 hour;SNF     Equipment Recommendations  None recommended by PT    Recommendations for Other Services       Precautions / Restrictions Precautions Precautions: Fall Precaution Comments: reports falls since COVID vaccine Restrictions Weight Bearing Restrictions: No    Mobility  Bed Mobility Overal bed mobility: Needs Assistance Bed Mobility: Supine to Sit;Sit to Supine     Supine to sit: Max assist;+2 for physical assistance Sit to supine: Total assist;+2 for physical assistance   General bed mobility comments: hand over hand assist to reach for rails; multimodal cues for sequencing; assistance required to bring bilat LE/hips to EOB and then to elevate trunk into sitting; total A +2 to return to supine   Transfers                    Ambulation/Gait                  Stairs             Wheelchair Mobility    Modified Rankin (Stroke Patients Only) Modified Rankin (Stroke Patients Only) Pre-Morbid Rankin Score: No significant disability Modified Rankin: Severe disability     Balance Overall balance assessment: Needs assistance Sitting-balance support: Bilateral upper extremity supported;Feet supported Sitting balance-Leahy Scale: Zero Sitting balance - Comments: max cues to assist with bilat UE for support; pt not attempting to use L UE; mod-max A required to maintain sitting balance EOB due to heavy L lateral bias; worked on midline posture and maintaining anterior trunk                                    Cognition Arousal/Alertness: Lethargic(awake and alert until mobilizing and then becam lethargic) Behavior During Therapy: Flat affect Overall Cognitive Status: Impaired/Different from baseline Area of Impairment: Memory;Following commands;Safety/judgement;Awareness;Problem solving;Orientation                 Orientation Level: Disoriented to;Time;Situation   Memory: Decreased short-term memory Following Commands: Follows one step commands with increased time;Follows one step commands inconsistently Safety/Judgement: Decreased awareness of safety;Decreased awareness of deficits Awareness: Intellectual Problem Solving: Slow processing;Decreased initiation;Difficulty sequencing;Requires verbal cues;Requires tactile cues General Comments: pt initially interactive/talkative and then mostly kept eyes closed rest of session and speaking minimally       Exercises      General  Comments        Pertinent Vitals/Pain Pain Assessment: Faces Faces Pain Scale: Hurts even more Pain Location: "needles" "everywhere" Pain Descriptors / Indicators: Pins and needles;Guarding;Grimacing Pain Intervention(s): Monitored during session;Repositioned(appeared to be worse with initial positional changes)    Home Living                       Prior Function            PT Goals (current goals can now be found in the care plan section) Progress towards PT goals: Progressing toward goals    Frequency    Min 3X/week      PT Plan Current plan remains appropriate    Co-evaluation PT/OT/SLP Co-Evaluation/Treatment: Yes Reason for Co-Treatment: Complexity of the patient's impairments (multi-system involvement);Necessary to address cognition/behavior during functional activity;For patient/therapist safety;To address functional/ADL transfers PT goals addressed during session: Mobility/safety with mobility;Balance        AM-PAC PT "6 Clicks" Mobility   Outcome Measure  Help needed turning from your back to your side while in a flat bed without using bedrails?: Total Help needed moving from lying on your back to sitting on the side of a flat bed without using bedrails?: Total Help needed moving to and from a bed to a chair (including a wheelchair)?: Total Help needed standing up from a chair using your arms (e.g., wheelchair or bedside chair)?: Total Help needed to walk in hospital room?: Total Help needed climbing 3-5 steps with a railing? : Total 6 Click Score: 6    End of Session   Activity Tolerance: Patient limited by lethargy Patient left: with call bell/phone within reach;in bed;with bed alarm set Nurse Communication: Mobility status;Need for lift equipment PT Visit Diagnosis: Muscle weakness (generalized) (M62.81);Difficulty in walking, not elsewhere classified (R26.2)     Time: 3329-5188 PT Time Calculation (min) (ACUTE ONLY): 24 min  Charges:  $Therapeutic Activity: 8-22 mins                     Earney Navy, PTA Acute Rehabilitation Services Pager: [phone] Office: [phone]     Darliss Cheney 08/17/2019, 11:32 AM

## 2019-08-17 NOTE — Progress Notes (Signed)
Nutrition Follow-up  DOCUMENTATION CODES:   Non-severe (moderate) malnutrition in context of chronic illness  INTERVENTION:   Continue 1:1 feeding assistance  Continue Ensure Enlive po QID, each supplement provides 350 kcal and 20 grams of protein  D/c Calorie Count   If pt's po intake does not improve/if pt cannot consume enough oral nutrition supplements, enteral nutrition support should be considered.    NUTRITION DIAGNOSIS:   Moderate Malnutrition related to chronic illness(stroke) as evidenced by energy intake < 75% for > or equal to 1 month, percent weight loss, edema, mild fat depletion, mild muscle depletion.  Ongoing.  GOAL:   Patient will meet greater than or equal to 90% of their needs  Not met.   MONITOR:   PO intake, Supplement acceptance, Weight trends, Skin, Labs  REASON FOR ASSESSMENT:   Consult Calorie Count  ASSESSMENT:   Pt with a PMH significant for HTN, DM, HLD, CVA presented to ED with generalized weakness and fall.  Per MD, pt completed 5 days of IVIG and has demonstrated improved strength.   Calorie count was ordered over the weekend; however, only two tickets were placed in calorie count envelope.  On 4/9, it is documented that pt consumed 25% of 1 Ensure and 50% of a second Ensure. Pt also consumed 25% of pureed pears, providing only 14 kcals. Total intake for 4/9: 276 kcals (meets 15% of calorie needs) and 15 grams protein (meets 13% of protein needs).  On 4/10, pt is reported to have consumed 10% of one meal, providing 79 kcals (4% of calorie needs) and 3.5 grams of protein (3% protein needs). No information regarding Ensure consumption available.  Only one meal is documented in Epic for 4/11, and it is documented as 0%; no meal tickets available for review. No information regarding supplement consumption available.  Pt's Ensure consumption was not well documented over the weekend and RN today unable to provide insight regarding the  weekend; however, RN states he was told the pt does better with Ensure than her meals. Per RN, pt consuming 75-80% of her Ensures today. Per pt, she would be willing to consume 100% of them if she were to receive Chocolate and Strawberry flavors only.   If pt is able to consume 100% of the 4 Ensures Enlives she receives each day, she would be consuming 1400 kcal (80% estimated calorie needs) and 80 grams of  (72% estimated protein needs). If pt is unable to consume these supplements and isn't able to increase her food intake, nutrition support should be considered.   Medications reviewed and include: Novolog, Remeron, Potassium chloride 10 mEq IV  Labs reviewed: K+ 3.2 (L), CBGs 87-115  UOP: 643m x24 hours I/O: +7,[phone] admit    Diet Order:   Diet Order            DIET - DYS 1 Room service appropriate? Yes; Fluid consistency: Thin  Diet effective now              EDUCATION NEEDS:   Not appropriate for education at this time  Skin:  Skin Assessment: Skin Integrity Issues: Skin Integrity Issues:: Other (Comment) Other: non-pressure wound buttocks  Last BM:  4/10  Height:   Ht Readings from Last 1 Encounters:  08/01/19 5' 2"  (1.575 m)    Weight:   Wt Readings from Last 1 Encounters:  08/17/19 84.2 kg    BMI:  Body mass index is 33.95 kg/m.  Estimated Nutritional Needs:   Kcal:  175643-3295  Protein:  110-125 grams  Fluid:  >1.75 L   Larkin Ina, MS, RD, LDN RD pager number and weekend/on-call pager number located in [place].

## 2019-08-17 NOTE — Progress Notes (Addendum)
PROGRESS NOTE  Denise Santiago GTX:[id number] DOB: 05-23-1955 DOA: 07/26/2019 PCP: Renford Dills, MD  Brief History   Denise Hamiltonis a 64 y.o.femalewith medical history significant ofhypertension, diabetes mellitus, hyperlipidemia, CVA on 12/25/2018 and 03/27/2019(has loop recorder), tobacco abuse presents to emergency department due to generalized weakness.  Patient tells me that she received Covid vaccine last Saturday and since then she has been feeling weak, lethargic, has no energy and has decreased appetite. Yesterday she slid out of her chair onto the floor and was not able to get up due to generalized weakness. She stayed on the floor all night long. Her niece was with her at that time but she could not get her up as well. EMS was called this morning and brought here to the emergency department for further evaluation and management.  Patient denies headache, blurry vision, slurred speech, head trauma, loss of consciousness, seizures, chest pain, shortness of breath, palpitation, leg swelling, fever, chills, nausea, vomiting, diarrhea, cough, congestion, wheezing, dysuria or any other urinary symptoms.  She lives alone and uses walker and cane for ambulation. She continues to smoke 1 pack of cigarettes per Santiago however denies alcohol, recent drug use.  As per the sister, patient has lost more than 50 pounds since November after her last stroke.On further questioning, pt reports that she has altered taste and no appetite whatso ever, along with loose teeth , preventing her to eat any solid food. Malignancy work up has been initiated and CT abd , pelvis and chest with contrast ordered  On 3/27forfurther evaluation. CT of the chest with contrast showed acute bilateral pulmonary embolism and CT of the abdomen showed ascending colon colitis.   Per sister patient started have strange behavior since Christmas, she started to eat less, But patient has been able to perform ADLs,  even driving to work on March 15.  The patient has been very drowsy and weak. She has a very poor PO intake. 48 hour calorie count over the weekend demonstrates that the patient oral intake remains low. However, she does drink ensure without difficulty. Per nutrition if she is able to take 4 cans of ensure daily, she will meed 80% of her nutritional needs. If she is not able to increase her nutritional intake, we will have to consider feeding tube.  The patient has completed 5 days of IVIG, and has demonstrated improved strength. Continue PT/OT.  She received her first Covid vaccine on March 13, second dose scheduled on April 7.  Consultants  . Neurology . Dentistry . Palliative care . Psychiatry . Cardiology  Procedures  . None  Antibiotics   Anti-infectives (From admission, onward)   Start     Dose/Rate Route Frequency Ordered Stop   08/13/19 2330  piperacillin-tazobactam (ZOSYN) IVPB 3.375 g  Status:  Discontinued     3.375 g 12.5 mL/hr over 240 Minutes Intravenous Every 8 hours 08/13/19 1610 08/15/19 1249   08/13/19 1530  piperacillin-tazobactam (ZOSYN) IVPB 3.375 g  Status:  Discontinued     3.375 g 12.5 mL/hr over 240 Minutes Intravenous Every 8 hours 08/13/19 1412 08/13/19 1610   08/04/19 1015  amoxicillin-clavulanate (AUGMENTIN) 250-62.5 MG/5ML suspension 500 mg     500 mg Oral Every 12 hours 08/04/19 0844 08/08/19 2139   08/03/19 2000  fluconazole (DIFLUCAN) IVPB 100 mg  Status:  Discontinued     100 mg 50 mL/hr over 60 Minutes Intravenous Every 24 hours 08/03/19 1802 08/09/19 1813   08/01/19 1715  piperacillin-tazobactam (ZOSYN) IVPB 3.375 g  Status:  Discontinued     3.375 g 12.5 mL/hr over 240 Minutes Intravenous Every 8 hours 08/01/19 1712 08/04/19 0844   07/30/19 0000  cefTRIAXone (ROCEPHIN) 1 g in sodium chloride 0.9 % 100 mL IVPB  Status:  Discontinued     1 g 200 mL/hr over 30 Minutes Intravenous Every 24 hours 07/29/19 1420 07/29/19 1421   07/29/19 1421   cefTRIAXone (ROCEPHIN) 1 g in sodium chloride 0.9 % 100 mL IVPB  Status:  Discontinued     1 g 200 mL/hr over 30 Minutes Intravenous Every 24 hours 07/29/19 1421 08/01/19 0716      Subjective  The patient is resting comfortably in bed. No new complaints.  Objective   Vitals:  Vitals:   08/17/19 0725 08/17/19 1136  BP: (!) 171/81 (!) 151/75  Pulse: 97 92  Resp: 18 18  Temp: 99.4 F (37.4 C) 98.6 F (37 C)  SpO2: 96% 97%   Exam:  Constitutional:  . The patient is awake and alert. More oriented. No acute distress.  Respiratory:  . No increased work of breathing. . No wheezes, rales, or rhonchi . No tactile fremitus Cardiovascular:  . Regular rate and rhythm . No murmurs, ectopy, or gallups. . No lateral PMI. No thrills. Abdomen:  . Abdomen is soft, non-tender, non-distended . No hernias, masses, or organomegaly . Normoactive bowel sounds.  Musculoskeletal:  . No cyanosis, clubbing, or edema Skin:  . No rashes, lesions, ulcers . palpation of skin: no induration or nodules Neurologic:  . CN 2-12 intact . Sensation all 4 extremities intact . Bilateral upper and lower extremity weakness Psychiatric:  . The patient is awake, alert, and oriented x 3. . No hallucination x 24 hours per patient.  I have personally reviewed the following:   Today's Data  . Vitals, CBC, BMP  Micro Data  . UCx (3/22) + for E. coli  Scheduled Meds: . antiseptic oral rinse  15 mL Mouth Rinse BID  . apixaban  5 mg Oral BID  . Chlorhexidine Gluconate Cloth  6 each Topical Daily  . clopidogrel  75 mg Oral Daily  . feeding supplement (ENSURE ENLIVE)  237 mL Oral QID  . hydrALAZINE  25 mg Oral Q8H  . insulin aspart  0-15 Units Subcutaneous TID WC  . insulin aspart  0-5 Units Subcutaneous QHS  . latanoprost  1 drop Both Eyes QHS  . lisinopril  20 mg Oral Daily  . mirtazapine  15 mg Oral QHS  . nicotine  21 mg Transdermal Daily  . nystatin  5 mL Oral QID  . sodium chloride flush  3 mL  Intravenous Once   Continuous Infusions:   Principal Problem:   Major depressive disorder Active Problems:   Essential hypertension   Type 2 diabetes mellitus without complication (HCC)   CVA (cerebral vascular accident) (HCC)   Hyperlipidemia   Generalized weakness   Hypokalemia   Hyponatremia   Acute on chronic kidney failure (HCC)   Thrombocytopenia (HCC)   Elevated troponin   Lactic acidosis   AKI (acute kidney injury) (HCC)   Palliative care by specialist   Goals of care, counseling/discussion   Xerostomia   Dry lips   Failure to thrive in adult   Physical debility   Malnutrition of moderate degree   Esophageal thrush (HCC)   Palliative care encounter   Pulmonary embolism (HCC)   DVT (deep venous thrombosis) (HCC)   Thrush, oral   Colitis   LOS: 21 days  A & P  FTT/progressive weakness/poor oral intake with reported weight loss: Per sister patient started have strange behavior since Christmas, she started eat less, But patient has been able to perform ADLs, even driving to work on March 15. She is now very weak not able to lift lower extremity against gravity,bilateral r arm are very weak as well. MRI of the brain was repeated and no acute findings were found. Neurology (Dr. Otelia Limes) consulted to assess movement disorder, possible myopathy. MRI spine no acute findings, per neurology Dr. 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 Due to anticoagulation for PE treatment, not able to perform LP safely. For this reason Dr. Otelia Limes recommended empiric treatment with IVIG. This has now completed. Neurology has reassessed the patient today. They feel that she has improved and recommends PT/OT. I appreciate neurology's assistance.  Anti GQ1b antibody ordered. Pending. I appreciate neurology's assistance.  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 Nutrition has been consulted and calorie count has been initiation. She has been started on Remeron for appetite stimulant, continue nutrition supplement. Esophagram and orthopantogram ordered on March 27th, It does not appear to have done, presumably due to lethargy and does not take oral meds reliably. She has receiving IVIG treatment as per neurology. 48 hour calorie count over the weekend demonstrates that the patient oral intake remains low. However, she does drink ensure without difficulty. Per nutrition if she is able to take 4 cans of ensure daily, she will meed 80% of her nutritional needs. If she is not able to increase her nutritional intake, we will have to consider feeding tube.  Ascending colon colitis, incidental finding on CT scan during malignancy work-up: Patient denies any nausea or vomiting and abdominal pain. Blood culture have shown no growth. She has been treated with 10 days of antibiotics including Rocephin , Zosyn then oral Augmentin. She was also started on Diflucan on March 29. It was discontinued on April 4.  Bilateral pulmonary embolism(incidental finding on ct chest during malignancy work-up) Patient was started on IV heparin. She has now been transitioned to oral Eliquis. Venous duplex of the lower extremities shows acute DVT involving the single left posterior tibial vein as well. Echocardiogram ordered showed left ventricular ejection fraction of 60 to 65% without any regional wall abnormalities. Left ventricular diastolic parameters are consistent with grade 1 diastolic dysfunction. No right heart strain seen.  Hematuria: UA has demonstrated triple phosphate crystals. Urine culture has been ordered. I will start the patient on IV Antibiotics to cover for klebsiella pneumonia and proteus species. Thus far repeat urine culture has had no growth.  Thrombocytopenia, acute on chronic: Platelet  88 on presentation 113 on 08/14/2019. Likely due to PE and DVT  Urinary retention, Foley inserted on April 3. Removed today. Voiding trial ordered. Monitored.  AKI superimposed on stage II CKD: Creatinine 1.38 on presentation, 08/14/2019 is 0.76. Repeat BMP in the morning. Monitor renal function.  Hypokalemia: supplement and monitor. remain low continue to replace K.   History of recurrent CVA: CVA on 12/25/2018 and 03/27/2019(has loop recorder). MRI brain obtained March 25 demonstrated no acute CVA. Continue Plavix and statin. She is on Eliquis now due to PE.  Constipation; appear resolved.  Psychosis: pt is having hallucinations. Will increase dose of Remeron.  Body mass index is 33.95 kg/m. Obesity complicates all cares.  First Covid vaccine  [employer] )on March 13. Second dose due on 4/7. The patient will have to have second dose given when she leaves the hospital.  The patient has been seen and examined by me. I have spent 34 minutes in her evaluation and care.  DVT Prophylaxis: On Eliquis Code Status: DNR Family Communication: I have discussed the patient with her sister, Denise Santiago. All questions answered to the best of my ability. They are considering hospice if the patient's oral intake does not pick-up. Disposition Plan: The patient came from home. Anticipate discharge to SNF vs [facility] PT with 24 /7/ supervision. Barriers to dc include patient is currently receiving IVIG, poor oral intake, and difficult to place due to lack of insurance.  Denise Day, DO [facility] Direct contact: see [email or web address]  7PM-7AM contact night coverage as above 08/17/2019, 4:31 PM  LOS: 14 days

## 2019-08-17 NOTE — Progress Notes (Signed)
Occupational Therapy Treatment Patient Details Name: Denise Santiago MRN: [id number] DOB: 04/25/1956 Today's Date: 08/17/2019    History of present illness 64 y.o. female with a hx of DM, HTN, HLD, CVA 12/2018 & 03/2019, ongoing tob use who presented to ED with generalized weakness and elevated troponin, thought to be non specific by cardiology. pt reports weakness after COVID vaccine 07/16/19. The day prior to admission pt slid out of chair to floor and remained through the night.    OT comments  Patient continues to make progress towards goals in skilled OT session. Patient's session encompassed co-treat with PT to address increased activity tolerance sitting EOB. Pt continues to remain disoriented and talkative at the beginning of the session, however as soon as increased energy is demanded, pt closes eyes and requires max multi-modal cues to participate. Pt able to sit EOB for 15 minutes with therapist's however is dependent on external support and remains total A to complete ADL tasks. Will continue to follow acutely to address deficits.   Follow Up Recommendations  SNF    Equipment Recommendations  Other (comment);Hospital bed;Wheelchair cushion (measurements OT);Wheelchair (measurements OT)    Recommendations for Other Services      Precautions / Restrictions Precautions Precautions: Fall Precaution Comments: reports falls since COVID vaccine Restrictions Weight Bearing Restrictions: No       Mobility Bed Mobility Overal bed mobility: Needs Assistance Bed Mobility: Supine to Sit;Sit to Supine     Supine to sit: Max assist;+2 for physical assistance Sit to supine: Total assist;+2 for physical assistance   General bed mobility comments: hand over hand assist to reach for rails; multimodal cues for sequencing; assistance required to bring bilat LE/hips to EOB and then to elevate trunk into sitting; total A +2 to return to supine   Transfers                       Balance Overall balance assessment: Needs assistance Sitting-balance support: Bilateral upper extremity supported;Feet supported Sitting balance-Leahy Scale: Zero Sitting balance - Comments: max cues to assist with bilat UE for support; pt not attempting to use L UE; mod-max A required to maintain sitting balance EOB due to heavy L lateral bias; worked on midline posture and maintaining anterior trunk                                   ADL either performed or assessed with clinical judgement   ADL Overall ADL's : Needs assistance/impaired     Grooming: Total assistance;Wash/dry face Grooming Details (indicate cue type and reason): Attempted 3x at EOB, pt grabbed wash cloth during first trial, but with decreased awareness and pt did not realize she had dropped washcloth, required total A to complete for thoroughness as the next two trials pt would dab mouth despite verbal cues and teach back techniques                             Functional mobility during ADLs: Total assistance;Maximal assistance General ADL Comments: Pt with increased ability to sit EOB, continues to require total A due to cognition and generalized weakness,     Vision       Perception     Praxis      Cognition Arousal/Alertness: Lethargic Behavior During Therapy: Flat affect Overall Cognitive Status: Impaired/Different from baseline Area of Impairment: Memory;Following commands;Safety/judgement;Awareness;Problem solving;Orientation  Orientation Level: Disoriented to;Time;Situation Current Attention Level: Sustained Memory: Decreased short-term memory Following Commands: Follows one step commands with increased time;Follows one step commands inconsistently Safety/Judgement: Decreased awareness of safety;Decreased awareness of deficits Awareness: Intellectual Problem Solving: Slow processing;Decreased initiation;Difficulty sequencing;Requires verbal  cues;Requires tactile cues General Comments: pt initially interactive/talkative and then mostly kept eyes closed rest of session and speaking minimally         Exercises     Shoulder Instructions       General Comments      Pertinent Vitals/ Pain       Pain Assessment: Faces Faces Pain Scale: Hurts even more Pain Location: "needles" "everywhere" Pain Descriptors / Indicators: Pins and needles;Guarding;Grimacing Pain Intervention(s): Monitored during session;Repositioned  Home Living                                          Prior Functioning/Environment              Frequency  Min 2X/week        Progress Toward Goals  OT Goals(current goals can now be found in the care plan section)  Progress towards OT goals: Progressing toward goals  Acute Rehab OT Goals Patient Stated Goal: none stated OT Goal Formulation: Patient unable to participate in goal setting Time For Goal Achievement: 08/17/19 Potential to Achieve Goals: Fair  Plan Discharge plan remains appropriate    Co-evaluation    PT/OT/SLP Co-Evaluation/Treatment: Yes Reason for Co-Treatment: Necessary to address cognition/behavior during functional activity;To address functional/ADL transfers;For patient/therapist safety;Complexity of the patient's impairments (multi-system involvement) PT goals addressed during session: Mobility/safety with mobility;Balance OT goals addressed during session: Proper use of Adaptive equipment and DME;Strengthening/ROM      AM-PAC OT "6 Clicks" Daily Activity     Outcome Measure   Help from another person eating meals?: Total Help from another person taking care of personal grooming?: Total Help from another person toileting, which includes using toliet, bedpan, or urinal?: Total Help from another person bathing (including washing, rinsing, drying)?: Total Help from another person to put on and taking off regular upper body clothing?: Total Help from  another person to put on and taking off regular lower body clothing?: Total 6 Click Score: 6    End of Session    OT Visit Diagnosis: Unsteadiness on feet (R26.81);Muscle weakness (generalized) (M62.81)   Activity Tolerance Patient limited by fatigue;Patient limited by lethargy   Patient Left in bed;with call bell/phone within reach;with bed alarm set   Nurse Communication Mobility status;Precautions        Time: 0347-4259 OT Time Calculation (min): 24 min  Charges: OT General Charges $OT Visit: 1 Visit OT Treatments $Self Care/Home Management : 8-22 mins  Pollyann Glen E. Rashun Grattan, COTA/L Acute Rehabilitation Services [phone]   Cherlyn Cushing 08/17/2019, 2:31 PM

## 2019-08-18 LAB — BASIC METABOLIC PANEL
Anion gap: 9 (ref 5–15)
BUN: 12 mg/dL (ref 8–23)
CO2: 24 mmol/L (ref 22–32)
Calcium: 8.5 mg/dL — ABNORMAL LOW (ref 8.9–10.3)
Chloride: 104 mmol/L (ref 98–111)
Creatinine, Ser: 0.77 mg/dL (ref 0.44–1.00)
GFR calc Af Amer: >60 mL/min (ref >60)
GFR calc non Af Amer: >60 mL/min (ref >60)
Glucose, Bld: 141 mg/dL — ABNORMAL HIGH (ref 70–99)
Potassium: 3.1 mmol/L — ABNORMAL LOW (ref 3.5–5.1)
Sodium: 137 mmol/L (ref 135–145)

## 2019-08-18 LAB — GLUCOSE, CAPILLARY
Glucose-Capillary: 128 mg/dL — ABNORMAL HIGH (ref 70–99)
Glucose-Capillary: 103 mg/dL — ABNORMAL HIGH (ref 70–99)
Glucose-Capillary: 93 mg/dL (ref 70–99)
Glucose-Capillary: 130 mg/dL — ABNORMAL HIGH (ref 70–99)

## 2019-08-18 MED ORDER — POTASSIUM CHLORIDE CRYS ER 20 MEQ PO TBCR
20.0000 meq | EXTENDED_RELEASE_TABLET | Freq: Three times a day (TID) | ORAL | Status: AC
  Administered 2019-08-18 (×3): 20 meq via ORAL
  Filled 2019-08-18 (×3): qty 1

## 2019-08-18 NOTE — Progress Notes (Signed)
PROGRESS NOTE  Denise Santiago ZOX:[id number] DOB: [birth year] DOA: 07/26/2019 PCP: Seward Carol, MD  Brief History   Denise Hamiltonis a 64 y.o.femalewith medical history significant ofhypertension, diabetes mellitus, hyperlipidemia, CVA on 12/25/2018 and 03/27/2019(has loop recorder), tobacco abuse presents to emergency department due to generalized weakness.  Patient tells me that she received Covid vaccine last Saturday and since then she has been feeling weak, lethargic, has no energy and has decreased appetite. Yesterday she slid out of her chair onto the floor and was not able to get up due to generalized weakness. She stayed on the floor all night long. Her niece was with her at that time but she could not get her up as well. EMS was called this morning and brought here to the emergency department for further evaluation and management.  Patient denies headache, blurry vision, slurred speech, head trauma, loss of consciousness, seizures, chest pain, shortness of breath, palpitation, leg swelling, fever, chills, nausea, vomiting, diarrhea, cough, congestion, wheezing, dysuria or any other urinary symptoms.  She lives alone and uses walker and cane for ambulation. She continues to smoke 1 pack of cigarettes per day however denies alcohol, recent drug use.  As per the sister, patient has lost more than 50 pounds since November after her last stroke.On further questioning, pt reports that she has altered taste and no appetite whatso ever, along with loose teeth , preventing her to eat any solid food. Malignancy work up has been initiated and CT abd , pelvis and chest with contrast ordered  On 3/16forfurther evaluation. CT of the chest with contrast showed acute bilateral pulmonary embolism and CT of the abdomen showed ascending colon colitis.   Per sister patient started have strange behavior since Christmas, she started to eat less, But patient has been able to perform ADLs,  even driving to work on March 15.  The patient has been very drowsy and weak. She has a very poor PO intake. 48 hour calorie count over the weekend demonstrates that the patient oral intake remains low. However, she does drink ensure without difficulty. Per nutrition if she is able to take 4 cans of ensure daily, she will meed 80% of her nutritional needs. If she is not able to increase her nutritional intake, we will have to consider feeding tube. Psychiatry was consulted for her anorexia. They have started her on Remeron. This is expected to improve her appetite and address her depressive symptoms.  The patient has completed 5 days of IVIG, and has demonstrated improved strength. Continue PT/OT.  She received her first Covid vaccine on March 13, second dose scheduled on April 7.  Consultants  . Neurology . Dentistry . Palliative care . Psychiatry . Cardiology  Procedures  . None  Antibiotics   Anti-infectives (From admission, onward)   Start     Dose/Rate Route Frequency Ordered Stop   08/13/19 2330  piperacillin-tazobactam (ZOSYN) IVPB 3.375 g  Status:  Discontinued     3.375 g 12.5 mL/hr over 240 Minutes Intravenous Every 8 hours 08/13/19 1610 08/15/19 1249   08/13/19 1530  piperacillin-tazobactam (ZOSYN) IVPB 3.375 g  Status:  Discontinued     3.375 g 12.5 mL/hr over 240 Minutes Intravenous Every 8 hours 08/13/19 1412 08/13/19 1610   08/04/19 1015  amoxicillin-clavulanate (AUGMENTIN) 250-62.5 MG/5ML suspension 500 mg     500 mg Oral Every 12 hours 08/04/19 0844 08/08/19 2139   08/03/19 2000  fluconazole (DIFLUCAN) IVPB 100 mg  Status:  Discontinued     100 mg  50 mL/hr over 60 Minutes Intravenous Every 24 hours 08/03/19 1802 08/09/19 1813   08/01/19 1715  piperacillin-tazobactam (ZOSYN) IVPB 3.375 g  Status:  Discontinued     3.375 g 12.5 mL/hr over 240 Minutes Intravenous Every 8 hours 08/01/19 1712 08/04/19 0844   07/30/19 0000  cefTRIAXone (ROCEPHIN) 1 g in sodium chloride  0.9 % 100 mL IVPB  Status:  Discontinued     1 g 200 mL/hr over 30 Minutes Intravenous Every 24 hours 07/29/19 1420 07/29/19 1421   07/29/19 1421  cefTRIAXone (ROCEPHIN) 1 g in sodium chloride 0.9 % 100 mL IVPB  Status:  Discontinued     1 g 200 mL/hr over 30 Minutes Intravenous Every 24 hours 07/29/19 1421 08/01/19 0716      Subjective  The patient is resting comfortably in bed. No new complaints.  Objective   Vitals:  Vitals:   08/18/19 0740 08/18/19 1148  BP: (!) 154/83 (!) 176/77  Pulse: 83 82  Resp: 16 18  Temp: 98.6 F (37 C) 98.4 F (36.9 C)  SpO2: 100% 98%   Exam:  Constitutional:  . The patient is awake and alert. More oriented. No acute distress.  Respiratory:  . No increased work of breathing. . No wheezes, rales, or rhonchi . No tactile fremitus Cardiovascular:  . Regular rate and rhythm . No murmurs, ectopy, or gallups. . No lateral PMI. No thrills. Abdomen:  . Abdomen is soft, non-tender, non-distended . No hernias, masses, or organomegaly . Normoactive bowel sounds.  Musculoskeletal:  . No cyanosis, clubbing, or edema Skin:  . No rashes, lesions, ulcers . palpation of skin: no induration or nodules Neurologic:  . CN 2-12 intact . Sensation all 4 extremities intact . Bilateral upper and lower extremity weakness Psychiatric:  . The patient is awake, alert, and oriented x 3. . No hallucination x 24 hours per patient.  I have personally reviewed the following:   Today's Data  . Vitals, BMP  Micro Data  . UCx (3/22) + for E. coli  Scheduled Meds: . antiseptic oral rinse  15 mL Mouth Rinse BID  . apixaban  5 mg Oral BID  . Chlorhexidine Gluconate Cloth  6 each Topical Daily  . clopidogrel  75 mg Oral Daily  . dronabinol  2.5 mg Oral BID AC  . feeding supplement (ENSURE ENLIVE)  237 mL Oral QID  . hydrALAZINE  25 mg Oral Q8H  . insulin aspart  0-15 Units Subcutaneous TID WC  . insulin aspart  0-5 Units Subcutaneous QHS  . latanoprost  1  drop Both Eyes QHS  . lisinopril  20 mg Oral Daily  . mirtazapine  15 mg Oral QHS  . nicotine  21 mg Transdermal Daily  . nystatin  5 mL Oral QID  . potassium chloride  20 mEq Oral TID  . sodium chloride flush  3 mL Intravenous Once   Continuous Infusions:   Principal Problem:   Major depressive disorder Active Problems:   Essential hypertension   Type 2 diabetes mellitus without complication (HCC)   CVA (cerebral vascular accident) (HCC)   Hyperlipidemia   Generalized weakness   Hypokalemia   Hyponatremia   Acute on chronic kidney failure (HCC)   Thrombocytopenia (HCC)   Elevated troponin   Lactic acidosis   AKI (acute kidney injury) (HCC)   Palliative care by specialist   Goals of care, counseling/discussion   Xerostomia   Dry lips   Failure to thrive in adult   Physical debility  Malnutrition of moderate degree   Esophageal thrush (HCC)   Palliative care encounter   Pulmonary embolism (HCC)   DVT (deep venous thrombosis) (HCC)   Thrush, oral   Colitis   LOS: 22 days   A & P  FTT/progressive weakness/poor oral intake with reported weight loss: Per sister patient started have strange behavior since Christmas, she started eat less, But patient has been able to perform ADLs, even driving to work on March 15. She is now very weak not able to lift lower extremity against gravity,bilateral r arm are very weak as well. MRI of the brain was repeated and no acute findings were found. Neurology (Dr. Otelia Limes) consulted to assess movement disorder, possible myopathy. MRI spine no acute findings, per neurology Dr. 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 Due to anticoagulation for PE treatment, not able to perform LP safely. For this reason Dr. Otelia Limes recommended empiric treatment with IVIG. This has now completed. Neurology has reassessed the patient today. They feel that she has improved and recommends PT/OT. I appreciate neurology's assistance.   Anti GQ1b antibody ordered. Pending. I appreciate neurology's assistance.  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 Nutrition has been consulted and calorie count has been initiation. She has been started on Remeron for appetite stimulant, continue nutrition supplement. Esophagram and orthopantogram ordered on March 27th, It does not appear to have done, presumably due to lethargy and does not take oral meds reliably. She has receiving IVIG treatment as per neurology. 48 hour calorie count over the weekend demonstrates that the patient oral intake remains low. However, she does drink ensure without difficulty. Per nutrition if she is able to take 4 cans of ensure daily, she will meed 80% of her nutritional needs. If she is not able to increase her nutritional intake, we will have to consider feeding tube. Psychiatry was consulted to address her anorexia. They have started the patient on remeron at hs. They expect that this will address her appetite and depressive symptoms.  Ascending colon colitis, incidental finding on CT scan during malignancy work-up: Patient denies any nausea or vomiting and abdominal pain. Blood culture have shown no growth. She has been treated with 10 days of antibiotics including Rocephin , Zosyn then oral Augmentin. She was also started on Diflucan on March 29. It was discontinued on April 4.  Bilateral pulmonary embolism(incidental finding on ct chest during malignancy work-up) Patient was started on IV heparin. She has now been transitioned to oral Eliquis. Venous duplex of the lower extremities shows acute DVT involving the single left posterior tibial vein as well. Echocardiogram ordered showed left ventricular ejection fraction of 60 to 65% without any regional wall abnormalities. Left ventricular  diastolic parameters are consistent with grade 1 diastolic dysfunction. No right heart strain seen.  Hematuria: UA has demonstrated triple phosphate crystals. Urine culture has been ordered. I will start the patient on IV Antibiotics to cover for klebsiella pneumonia and proteus species. Thus far repeat urine culture has had no growth.  Thrombocytopenia, acute on chronic: Platelet 88 on presentation 113 on 08/14/2019. Likely due to PE and DVT  Urinary retention, Foley inserted on April 3. Removed today. Voiding trial ordered. Monitored.  AKI superimposed on stage II CKD: Creatinine 1.38 on presentation, 08/14/2019 is 0.76. Repeat BMP in the morning. Monitor renal function.  Hypokalemia: supplement and monitor.  remain low continue to replace K.   History of recurrent CVA: CVA on 12/25/2018 and 03/27/2019(has loop recorder). MRI brain obtained March 25 demonstrated no acute CVA. Continue Plavix and statin. She is on Eliquis now due to PE.  Constipation; appear resolved.  Psychosis: Hallucinations resolved.  Body mass index is 33.95 kg/m. Obesity complicates all cares.   First Covid vaccine  [employer] )on March 13. Second dose due on 4/7. The patient will have to have second dose given when she leaves the hospital.  The patient has been seen and examined by me. I have spent 32 minutes in her evaluation and care.  DVT Prophylaxis: On Eliquis Code Status: DNR Family Communication: I have discussed the patient with her sister, Denise Santiago. All questions answered to the best of my ability. They are considering hospice if the patient's oral intake does not pick-up. Disposition Plan: The patient came from home. Anticipate discharge to SNF vs [facility] PT with 24 /7/ supervision. Barriers to dc include poor oral intake, and difficult to place due to lack of insurance.  Denise Roberti, DO [facility] Direct contact: see [email or web address]  7PM-7AM contact night coverage as above 08/18/2019,  2:07 PM  LOS: 14 days

## 2019-08-18 NOTE — Progress Notes (Signed)
Noted in [facility] note (08/17/19 1553) pt to have foley d/c'd 08/17/2019 for void trial. [facility]pyd asked about the order at 0133 during this shift. No order actually placed in order set.

## 2019-08-18 NOTE — Progress Notes (Signed)
Potassium 3.1, Opyd informed

## 2019-08-18 NOTE — Progress Notes (Signed)
Order placed to d/c Foley at 0140 08/18/2019. Foley d/c'd at 0200 08/18/2019, pt due to void by 0800 08/18/2019

## 2019-08-18 NOTE — Consult Note (Signed)
[facility] Consult   Reason for Consult:  "anorexia" Referring Physician:  Dr Benny Lennert Patient Identification: Denise Santiago MRN:  [id number] Principal Diagnosis: Major depressive disorder Diagnosis:  Principal Problem:   Major depressive disorder Active Problems:   Essential hypertension   Type 2 diabetes mellitus without complication (HCC)   CVA (cerebral vascular accident) ([place])   Hyperlipidemia   Generalized weakness   Hypokalemia   Hyponatremia   Acute on chronic kidney failure (HCC)   Thrombocytopenia (HCC)   Elevated troponin   Lactic acidosis   AKI (acute kidney injury) ([place])   Palliative care by specialist   Goals of care, counseling/discussion   Xerostomia   Dry lips   Failure to thrive in adult   Physical debility   Malnutrition of moderate degree   Esophageal thrush (HCC)   Palliative care encounter   Pulmonary embolism ([place])   DVT (deep venous thrombosis) ([place])   Thrush, oral   Colitis   Total Time spent with patient: 30 minutes  Subjective:   Denise Santiago is a 64 y.o. female patient.  Patient assessed by nurse practitioner.  Patient alert, oriented to self only at this time. Patient participates minimally in evaluation. Patient denies suicidal ideations.  Patient denies history of self-harm, denies history of suicide attempts.  Patient denies homicidal ideations. Patient reports "my appetite is okay but I need help." Patient breakfast tray at bedside, tray appears untouched.  Patient reports inability to feed self.    HPI: Patient admitted with generalized weakness.  Past Psychiatric History: Major depressive disorder  Risk to Self:  Denies Risk to Others:  Denies Prior Inpatient Therapy:  Unknown Prior Outpatient Therapy:  Unknown  Past Medical History:  Past Medical History:  Diagnosis Date  . CVA (cerebral vascular accident) ([place]) 12/2018   And 03/2019, loop recorder inserted 03/2019  . Diabetes mellitus without  complication ([place])   . Hyperlipidemia   . Hypertension   . Obesity     Past Surgical History:  Procedure Laterality Date  . ABDOMINAL HYSTERECTOMY    . BUBBLE STUDY  03/20/2019   Procedure: BUBBLE STUDY;  Surgeon: Josue Hector, MD;  Location: [facility] ENDOSCOPY;  Service: Cardiovascular;;  . LOOP RECORDER INSERTION N/A 03/20/2019   Procedure: LOOP RECORDER INSERTION;  Surgeon: Constance Haw, MD;  Location: [place] CV LAB;  Service: Cardiovascular;  Laterality: N/A;  . TEE WITHOUT CARDIOVERSION N/A 03/20/2019   Procedure: TRANSESOPHAGEAL ECHOCARDIOGRAM (TEE);  Surgeon: Josue Hector, MD;  Location: [facility] ENDOSCOPY;  Service: Cardiovascular;  Laterality: N/A;   Family History: No family history on file. Family Psychiatric  History: Unknown Social History:  Social History   Substance and Sexual Activity  Alcohol Use Yes  . Alcohol/week: 0.0 standard drinks   Comment: red wine per MD suggestion to lower cholesterol     Social History   Substance and Sexual Activity  Drug Use Never    Social History   Socioeconomic History  . Marital status: Single    Spouse name: none  . Number of children: Not on file  . Years of education: Not on file  . Highest education level: Not on file  Occupational History    Comment: [employer] is her employer  Tobacco Use  . Smoking status: Current Every Day Smoker    Packs/day: 1.00    Types: Cigarettes  . Smokeless tobacco: Never Used  Substance and Sexual Activity  . Alcohol use: Yes    Alcohol/week: 0.0 standard drinks  Comment: red wine per MD suggestion to lower cholesterol  . Drug use: Never  . Sexual activity: Not on file  Other Topics Concern  . Not on file  Social History Narrative   Leave alone   1-story home with 2-3 steps front   Right hand   Soda 3 can a day   [employer] is her employer   Social Determinants of Health   Financial Resource Strain: Low Risk   . Difficulty of  Paying Living Expenses: Not hard at all  Food Insecurity: No Food Insecurity  . Worried About Programme researcher, broadcasting/film/video in the Last Year: Never true  . Ran Out of Food in the Last Year: Never true  Transportation Needs: No Transportation Needs  . Lack of Transportation (Medical): No  . Lack of Transportation (Non-Medical): No  Physical Activity: Inactive  . Days of Exercise per Week: 0 days  . Minutes of Exercise per Session: 0 min  Stress: No Stress Concern Present  . Feeling of Stress : Not at all  Social Connections:   . Frequency of Communication with Friends and Family:   . Frequency of Social Gatherings with Friends and Family:   . Attends Religious Services:   . Active Member of Clubs or Organizations:   . Attends Banker Meetings:   Marland Kitchen Marital Status:    Additional Social History:    Allergies:   Allergies  Allergen Reactions  . Sulfur     Pt doesn't remember reaction    Labs:  Results for orders placed or performed during the hospital encounter of 07/26/19 (from the past 48 hour(s))  Glucose, capillary     Status: Abnormal   Collection Time: 08/16/19 11:37 AM  Result Value Ref Range   Glucose-Capillary 115 (H) 70 - 99 mg/dL    Comment: Glucose reference range applies only to samples taken after fasting for at least 8 hours.  Glucose, capillary     Status: None   Collection Time: 08/16/19  3:27 PM  Result Value Ref Range   Glucose-Capillary 91 70 - 99 mg/dL    Comment: Glucose reference range applies only to samples taken after fasting for at least 8 hours.  Glucose, capillary     Status: None   Collection Time: 08/16/19  9:16 PM  Result Value Ref Range   Glucose-Capillary 87 70 - 99 mg/dL    Comment: Glucose reference range applies only to samples taken after fasting for at least 8 hours.   Comment 1 Notify RN    Comment 2 Document in Chart   CBC     Status: Abnormal   Collection Time: 08/17/19  5:13 AM  Result Value Ref Range   WBC 5.9 4.0 - 10.5  K/uL   RBC 3.60 (L) 3.87 - 5.11 MIL/uL   Hemoglobin 10.8 (L) 12.0 - 15.0 g/dL   HCT 19.6 (L) 22.2 - 97.9 %   MCV 92.2 80.0 - 100.0 fL   MCH 30.0 26.0 - 34.0 pg   MCHC 32.5 30.0 - 36.0 g/dL   RDW 89.2 11.9 - 41.7 %   Platelets 145 (L) 150 - 400 K/uL   nRBC 0.3 (H) 0.0 - 0.2 %    Comment: Performed at [facility], [address] [place], [place] [address]  Basic metabolic panel     Status: Abnormal   Collection Time: 08/17/19  5:13 AM  Result Value Ref Range   Sodium 141 135 - 145 mmol/L  Potassium 3.2 (L) 3.5 - 5.1 mmol/L   Chloride 105 98 - 111 mmol/L   CO2 27 22 - 32 mmol/L   Glucose, Bld 111 (H) 70 - 99 mg/dL    Comment: Glucose reference range applies only to samples taken after fasting for at least 8 hours.   BUN 13 8 - 23 mg/dL   Creatinine, Ser 3.00 0.44 - 1.00 mg/dL   Calcium 8.6 (L) 8.9 - 10.3 mg/dL   GFR calc non Af Amer >60 >60 mL/min   GFR calc Af Amer >60 >60 mL/min   Anion gap 9 5 - 15    Comment: Performed at [facility], [address] [place], [place] [address]  Glucose, capillary     Status: Abnormal   Collection Time: 08/17/19  6:13 AM  Result Value Ref Range   Glucose-Capillary 102 (H) 70 - 99 mg/dL    Comment: Glucose reference range applies only to samples taken after fasting for at least 8 hours.   Comment 1 Notify RN    Comment 2 Document in Chart   Glucose, capillary     Status: Abnormal   Collection Time: 08/17/19 11:34 AM  Result Value Ref Range   Glucose-Capillary 101 (H) 70 - 99 mg/dL    Comment: Glucose reference range applies only to samples taken after fasting for at least 8 hours.   Comment 1 Notify RN    Comment 2 Document in Chart   Glucose, capillary     Status: Abnormal   Collection Time: 08/17/19  4:02 PM  Result Value Ref Range   Glucose-Capillary 116 (H) 70 - 99 mg/dL    Comment: Glucose reference range applies only to samples taken after fasting for at least 8 hours.   Comment 1 Notify RN    Comment 2 Document in  Chart   Glucose, capillary     Status: Abnormal   Collection Time: 08/17/19  9:11 PM  Result Value Ref Range   Glucose-Capillary 107 (H) 70 - 99 mg/dL    Comment: Glucose reference range applies only to samples taken after fasting for at least 8 hours.  Basic metabolic panel     Status: Abnormal   Collection Time: 08/18/19  3:39 AM  Result Value Ref Range   Sodium 137 135 - 145 mmol/L   Potassium 3.1 (L) 3.5 - 5.1 mmol/L   Chloride 104 98 - 111 mmol/L   CO2 24 22 - 32 mmol/L   Glucose, Bld 141 (H) 70 - 99 mg/dL    Comment: Glucose reference range applies only to samples taken after fasting for at least 8 hours.   BUN 12 8 - 23 mg/dL   Creatinine, Ser 0.76 0.44 - 1.00 mg/dL   Calcium 8.5 (L) 8.9 - 10.3 mg/dL   GFR calc non Af Amer >60 >60 mL/min   GFR calc Af Amer >60 >60 mL/min   Anion gap 9 5 - 15    Comment: Performed at [facility], [address], [place], [place] [address]  Glucose, capillary     Status: Abnormal   Collection Time: 08/18/19  6:27 AM  Result Value Ref Range   Glucose-Capillary 128 (H) 70 - 99 mg/dL    Comment: Glucose reference range applies only to samples taken after fasting for at least 8 hours.    Current Facility-Administered Medications  Medication Dose Route Frequency Provider Last Rate Last Admin  . acetaminophen (TYLENOL) tablet 650 mg  650 mg Oral Q6H  PRN Ollen BowlPahwani, Rinka R, MD   650 mg at 08/15/19 2252   Or  . acetaminophen (TYLENOL) suppository 650 mg  650 mg Rectal Q6H PRN Pahwani, Rinka R, MD      . antiseptic oral rinse (BIOTENE) solution 15 mL  15 mL Mouth Rinse BID Ernie AvenaFerolito, Michelle Y, NP   15 mL at 08/18/19 0854  . apixaban (ELIQUIS) tablet 5 mg  5 mg Oral BID Kathlen ModyAkula, Vijaya, MD   5 mg at 08/18/19 0851  . Chlorhexidine Gluconate Cloth 2 % PADS 6 each  6 each Topical Daily Albertine GratesXu, Fang, MD   6 each at 08/18/19 [phone]  . clopidogrel (PLAVIX) tablet 75 mg  75 mg Oral Daily Pahwani, Rinka R, MD   75 mg at 08/18/19 0852  . dronabinol (MARINOL)  capsule 2.5 mg  2.5 mg Oral BID AC Swayze, Ava, DO   2.5 mg at 08/17/19 1853  . feeding supplement (ENSURE ENLIVE) (ENSURE ENLIVE) liquid 237 mL  237 mL Oral QID Swayze, Ava, DO   237 mL at 08/18/19 0854  . hydrALAZINE (APRESOLINE) tablet 25 mg  25 mg Oral Q8H Kathlen ModyAkula, Vijaya, MD   25 mg at 08/18/19 0600  . insulin aspart (novoLOG) injection 0-15 Units  0-15 Units Subcutaneous TID WC Pahwani, Rinka R, MD   2 Units at 08/18/19 0701  . insulin aspart (novoLOG) injection 0-5 Units  0-5 Units Subcutaneous QHS Pahwani, Rinka R, MD   3 Units at 07/27/19 2205  . latanoprost (XALATAN) 0.005 % ophthalmic solution 1 drop  1 drop Both Eyes QHS Pahwani, Rinka R, MD   1 drop at 08/17/19 2124  . lisinopril (ZESTRIL) tablet 20 mg  20 mg Oral Daily Kathlen ModyAkula, Vijaya, MD   20 mg at 08/18/19 0851  . mirtazapine (REMERON) tablet 15 mg  15 mg Oral QHS Swayze, Ava, DO   15 mg at 08/17/19 2123  . nicotine (NICODERM CQ - dosed in mg/24 hours) patch 21 mg  21 mg Transdermal Daily Gwenyth BenderBlack, Karen M, NP   21 mg at 08/18/19 0854  . nystatin (MYCOSTATIN) 100000 UNIT/ML suspension 500,000 Units  5 mL Oral QID Dellinger, Marianne L, PA-C   500,000 Units at 08/18/19 [id number]  . ondansetron (ZOFRAN) tablet 4 mg  4 mg Oral Q6H PRN Pahwani, Rinka R, MD       Or  . ondansetron (ZOFRAN) injection 4 mg  4 mg Intravenous Q6H PRN Pahwani, Rinka R, MD   4 mg at 08/02/19 1858  . potassium chloride SA (KLOR-CON) CR tablet 20 mEq  20 mEq Oral TID Briscoe Deutscherpyd, Timothy S, MD   20 mEq at 08/18/19 0851  . sodium chloride flush (NS) 0.9 % injection 3 mL  3 mL Intravenous Once Vanetta MuldersZackowski, Scott, MD      . vitamin A & D ointment   Topical PRN Sarina IllFerolito, Albertina SenegalMichelle Y, NP        Musculoskeletal: Strength & Muscle Tone: Unable to assess Gait & Station: Unable to assess Patient leans: N/A  Psychiatric Specialty Exam: Physical Exam  Nursing note and vitals reviewed. Constitutional: She appears well-developed.  HENT:  Head: Normocephalic.  Cardiovascular: Normal  rate.  Respiratory: Effort normal.  Neurological: She is alert.  Psychiatric: She has a normal mood and affect. Her speech is normal and behavior is normal. Thought content normal. Cognition and memory are impaired.    Review of Systems  Constitutional: Negative.   HENT: Negative.   Eyes: Negative.   Respiratory: Negative.   Cardiovascular: Negative.  Gastrointestinal: Negative.   Genitourinary: Negative.   Musculoskeletal: Negative.   Skin: Negative.   Neurological: Positive for weakness.    Blood pressure (!) 154/83, pulse 83, temperature 98.6 F (37 C), temperature source Oral, resp. rate 16, height 5\' 2"  (1.575 m), weight 82.2 kg, SpO2 100 %.Body mass index is 33.15 kg/m.  General Appearance: Fairly Groomed  Eye Contact:  Minimal  Speech:  Slow  Volume:  Decreased  Mood:  Euthymic  Affect:  Congruent  Thought Process:  Coherent  Orientation:  Other:  self only  Thought Content:  Logical  Suicidal Thoughts:  No  Homicidal Thoughts:  No  Memory:  Immediate;   Poor Recent;   Poor Remote;   Poor  Judgement:  Fair  Insight:  Fair  Psychomotor Activity:  Normal  Concentration:  Concentration: Poor  Recall:  Poor  Fund of Knowledge:  Good  Language:  Good  Akathisia:  No  Handed:  Right  AIMS (if indicated):     Assets:  Communication Skills Desire for Improvement Financial Resources/Insurance Housing Intimacy  ADL's:  Impaired  Cognition:  Impaired,  Mild  Sleep:        Treatment Plan Summary: Patient discussed with Dr. . Plan Continue Remeron 15 mg nightly, Remeron can address patient's depression as well as have a positive effect on patient's appetite.   Disposition: No evidence of imminent risk to self or others at present.   Patient does not meet criteria for psychiatric inpatient admission.  Lucianne Muss, FNP 08/18/2019 9:31 AM

## 2019-08-19 LAB — CBC
HCT: 36.7 % (ref 36.0–46.0)
Hemoglobin: 12.1 g/dL (ref 12.0–15.0)
MCH: 30.4 pg (ref 26.0–34.0)
MCHC: 33 g/dL (ref 30.0–36.0)
MCV: 92.2 fL (ref 80.0–100.0)
Platelets: 153 10*3/uL (ref 150–400)
RBC: 3.98 MIL/uL (ref 3.87–5.11)
RDW: 15.5 % (ref 11.5–15.5)
WBC: 4.9 10*3/uL (ref 4.0–10.5)
nRBC: 0 % (ref 0.0–0.2)

## 2019-08-19 LAB — GLUCOSE, CAPILLARY
Glucose-Capillary: 128 mg/dL — ABNORMAL HIGH (ref 70–99)
Glucose-Capillary: 98 mg/dL (ref 70–99)
Glucose-Capillary: 104 mg/dL — ABNORMAL HIGH (ref 70–99)
Glucose-Capillary: 93 mg/dL (ref 70–99)

## 2019-08-19 LAB — MISC LABCORP TEST (SEND OUT): Labcorp test code: 830721

## 2019-08-19 LAB — BASIC METABOLIC PANEL
Anion gap: 10 (ref 5–15)
BUN: 10 mg/dL (ref 8–23)
CO2: 22 mmol/L (ref 22–32)
Calcium: 8.9 mg/dL (ref 8.9–10.3)
Chloride: 105 mmol/L (ref 98–111)
Creatinine, Ser: 0.75 mg/dL (ref 0.44–1.00)
GFR calc Af Amer: >60 mL/min (ref >60)
GFR calc non Af Amer: >60 mL/min (ref >60)
Glucose, Bld: 132 mg/dL — ABNORMAL HIGH (ref 70–99)
Potassium: 3.4 mmol/L — ABNORMAL LOW (ref 3.5–5.1)
Sodium: 137 mmol/L (ref 135–145)

## 2019-08-19 LAB — MAGNESIUM: Magnesium: 1.8 mg/dL (ref 1.7–2.4)

## 2019-08-19 MED ORDER — POTASSIUM CHLORIDE CRYS ER 20 MEQ PO TBCR
20.0000 meq | EXTENDED_RELEASE_TABLET | Freq: Every day | ORAL | Status: DC
  Administered 2019-08-19 – 2019-09-04 (×17): 20 meq via ORAL
  Filled 2019-08-19 (×18): qty 1

## 2019-08-19 MED ORDER — SODIUM CHLORIDE 0.9 % IV BOLUS
1000.0000 mL | Freq: Once | INTRAVENOUS | Status: AC
  Administered 2019-08-19: 21:00:00 1000 mL via INTRAVENOUS

## 2019-08-19 MED ORDER — SODIUM CHLORIDE 0.9 % IV BOLUS: 500.0000 mL | Freq: Once | INTRAVENOUS | Status: DC

## 2019-08-19 NOTE — Progress Notes (Signed)
  Speech Language Pathology Treatment: Dysphagia  Patient Details Name: Denise Santiago MRN: [id number] DOB: 10/05/1955 Today's Date: 08/19/2019 Time: 2683-4196 SLP Time Calculation (min) (ACUTE ONLY): 10 min  Assessment / Plan / Recommendation Clinical Impression  Pt continues to be severely delirious but pleasant with decerebrate posturing in BUE. She reports having some ham a few days ago (defintely did not), has no awareness of poor intake or need for increased nutrition. She did not accept lunch. SLP was able to encourage pt to take some sips of water and bites of yogurt without additional difficulty. However she refuses solids and when given anything solid does not attempt to masticate. Pt has made little to no progress despite medical management of neurologic impairment. Will sign off at this time, pt to continue a puree diet with thin liquids. Can areorder SLP if significant improvement in cognition occurs prior to d/c.   HPI HPI: 64 year old woman with prior history of hypertension, diabetes, hyperlipidemia, CVA presents to ED for generalized weakness and fall.  Probably secondary to electrolyte abnormalities per MD. MRI shows chronic cortically based infarct within the posterior right cerebral hemisphere predominantly affecting the right temporal and occipital lobes and with minimal involvement of the adjacent right parietal lobe. Redemonstrated chronic cortically based infarct within right frontal operculum. Stable background chronic small vessel ischemic disease with chronic lacunar infarcts in the right corona radiata and bilateral basal ganglia.      SLP Plan  Continue with current plan of care       Recommendations  Diet recommendations: Dysphagia 1 (puree);Thin liquid Liquids provided via: Cup;Straw Medication Administration: Crushed with puree Supervision: Full supervision/cueing for compensatory strategies;Trained caregiver to feed patient Compensations: Slow rate;Small  sips/bites;Follow solids with liquid Postural Changes and/or Swallow Maneuvers: Seated upright 90 degrees                Oral Care Recommendations: Oral care BID Follow up Recommendations: 24 hour supervision/assistance SLP Visit Diagnosis: Dysphagia, unspecified (R13.10) Plan: Continue with current plan of care       GO               Harlon Ditty, MA CCC-SLP  Acute Rehabilitation Services Pager [phone] Office [phone]  Claudine Mouton 08/19/2019, 3:32 PM

## 2019-08-19 NOTE — Progress Notes (Signed)
Chaplain responded to request from patient's pastor to visit and have prayer. Offered ministry of presence as Denise Santiago spoke lovingly of her church, her pastor and her church friends. She reports her cousin and half-siblings visit.  Denise Santiago spoke of her faith and how at first she was worried about her medical issues, but how that passed because she trusts in the [place].  Chaplain affirmed Denise Santiago in her faith and had prayer.  Chaplain left voice mail for pastor so he would know of the spiritual visit her requested.  Vernell Morgans Chaplain Resident

## 2019-08-19 NOTE — Progress Notes (Signed)
PROGRESS NOTE  Tayli Buch ZOX:[id number] DOB: [birth year] DOA: 07/26/2019 PCP: Seward Carol, MD  Brief History   Denise Hamiltonis a 64 y.o.femalewith medical history significant ofhypertension, diabetes mellitus, hyperlipidemia, CVA on 12/25/2018 and 03/27/2019(has loop recorder), tobacco abuse presents to emergency department due to generalized weakness.  Patient tells me that she received Covid vaccine last Saturday and since then she has been feeling weak, lethargic, has no energy and has decreased appetite. Yesterday she slid out of her chair onto the floor and was not able to get up due to generalized weakness. She stayed on the floor all night long. Her niece was with her at that time but she could not get her up as well. EMS was called this morning and brought here to the emergency department for further evaluation and management.  Patient denies headache, blurry vision, slurred speech, head trauma, loss of consciousness, seizures, chest pain, shortness of breath, palpitation, leg swelling, fever, chills, nausea, vomiting, diarrhea, cough, congestion, wheezing, dysuria or any other urinary symptoms.  She lives alone and uses walker and cane for ambulation. She continues to smoke 1 pack of cigarettes per day however denies alcohol, recent drug use.  As per the sister, patient has lost more than 50 pounds since November after her last stroke.On further questioning, pt reports that she has altered taste and no appetite whatso ever, along with loose teeth , preventing her to eat any solid food. Malignancy work up has been initiated and CT abd , pelvis and chest with contrast ordered  On 3/51forfurther evaluation. CT of the chest with contrast showed acute bilateral pulmonary embolism and CT of the abdomen showed ascending colon colitis.   Per sister patient started have strange behavior since Christmas, she started to eat less, But patient has been able to perform ADLs,  even driving to work on March 15.  The patient has been very drowsy and weak. She has a very poor PO intake. 48 hour calorie count over the weekend demonstrates that the patient oral intake remains low. However, she does drink ensure without difficulty. Per nutrition if she is able to take 4 cans of ensure daily, she will meed 80% of her nutritional needs. If she is not able to increase her nutritional intake, we will have to consider feeding tube. Psychiatry was consulted for her anorexia. They have started her on Remeron. This is expected to improve her appetite and address her depressive symptoms.  The patient has completed 5 days of IVIG, and has demonstrated improved strength. Continue PT/OT.  She received her first Covid vaccine on March 13, second dose scheduled on April 7.  Consultants  . Neurology . Dentistry . Palliative care . Psychiatry . Cardiology  Procedures  . None  Antibiotics   Anti-infectives (From admission, onward)   Start     Dose/Rate Route Frequency Ordered Stop   08/13/19 2330  piperacillin-tazobactam (ZOSYN) IVPB 3.375 g  Status:  Discontinued     3.375 g 12.5 mL/hr over 240 Minutes Intravenous Every 8 hours 08/13/19 1610 08/15/19 1249   08/13/19 1530  piperacillin-tazobactam (ZOSYN) IVPB 3.375 g  Status:  Discontinued     3.375 g 12.5 mL/hr over 240 Minutes Intravenous Every 8 hours 08/13/19 1412 08/13/19 1610   08/04/19 1015  amoxicillin-clavulanate (AUGMENTIN) 250-62.5 MG/5ML suspension 500 mg     500 mg Oral Every 12 hours 08/04/19 0844 08/08/19 2139   08/03/19 2000  fluconazole (DIFLUCAN) IVPB 100 mg  Status:  Discontinued     100 mg  50 mL/hr over 60 Minutes Intravenous Every 24 hours 08/03/19 1802 08/09/19 1813   08/01/19 1715  piperacillin-tazobactam (ZOSYN) IVPB 3.375 g  Status:  Discontinued     3.375 g 12.5 mL/hr over 240 Minutes Intravenous Every 8 hours 08/01/19 1712 08/04/19 0844   07/30/19 0000  cefTRIAXone (ROCEPHIN) 1 g in sodium chloride  0.9 % 100 mL IVPB  Status:  Discontinued     1 g 200 mL/hr over 30 Minutes Intravenous Every 24 hours 07/29/19 1420 07/29/19 1421   07/29/19 1421  cefTRIAXone (ROCEPHIN) 1 g in sodium chloride 0.9 % 100 mL IVPB  Status:  Discontinued     1 g 200 mL/hr over 30 Minutes Intravenous Every 24 hours 07/29/19 1421 08/01/19 0716      Subjective  The patient is resting comfortably in bed. No new complaints. She states that she is eating.  Objective   Vitals:  Vitals:   08/19/19 0800 08/19/19 1129  BP: (!) 146/72 (!) 157/87  Pulse: 87 90  Resp: 16 20  Temp: 98 F (36.7 C) 98 F (36.7 C)  SpO2: 100% 100%   Exam:  Constitutional:  . The patient is awake and alert. More oriented. No acute distress.  Respiratory:  . No increased work of breathing. . No wheezes, rales, or rhonchi . No tactile fremitus Cardiovascular:  . Regular rate and rhythm . No murmurs, ectopy, or gallups. . No lateral PMI. No thrills. Abdomen:  . Abdomen is soft, non-tender, non-distended . No hernias, masses, or organomegaly . Normoactive bowel sounds.  Musculoskeletal:  . No cyanosis, clubbing, or edema Skin:  . No rashes, lesions, ulcers . palpation of skin: no induration or nodules Neurologic:  . CN 2-12 intact . Sensation all 4 extremities intact . Bilateral upper and lower extremity weakness Psychiatric:  . The patient is awake, alert, and oriented x 3. . No hallucination x 24 hours per patient.  I have personally reviewed the following:   Today's Data  . Vitals, BMP, CBC  Micro Data  . UCx (3/22) + for E. coli  Scheduled Meds: . antiseptic oral rinse  15 mL Mouth Rinse BID  . apixaban  5 mg Oral BID  . Chlorhexidine Gluconate Cloth  6 each Topical Daily  . clopidogrel  75 mg Oral Daily  . dronabinol  2.5 mg Oral BID AC  . feeding supplement (ENSURE ENLIVE)  237 mL Oral QID  . hydrALAZINE  25 mg Oral Q8H  . insulin aspart  0-15 Units Subcutaneous TID WC  . insulin aspart  0-5 Units  Subcutaneous QHS  . latanoprost  1 drop Both Eyes QHS  . lisinopril  20 mg Oral Daily  . mirtazapine  15 mg Oral QHS  . nicotine  21 mg Transdermal Daily  . nystatin  5 mL Oral QID  . potassium chloride  20 mEq Oral Daily  . sodium chloride flush  3 mL Intravenous Once     Principal Problem:   Major depressive disorder Active Problems:   Essential hypertension   Type 2 diabetes mellitus without complication (HCC)   CVA (cerebral vascular accident) (HCC)   Hyperlipidemia   Generalized weakness   Hypokalemia   Hyponatremia   Acute on chronic kidney failure (HCC)   Thrombocytopenia (HCC)   Elevated troponin   Lactic acidosis   AKI (acute kidney injury) (HCC)   Palliative care by specialist   Goals of care, counseling/discussion   Xerostomia   Dry lips   Failure to thrive in  adult   Physical debility   Malnutrition of moderate degree   Esophageal thrush (HCC)   Palliative care encounter   Pulmonary embolism (HCC)   DVT (deep venous thrombosis) (HCC)   Thrush, oral   Colitis   LOS: 23 days   A & P  FTT/progressive weakness/poor oral intake with reported weight loss: Per sister patient started have strange behavior since Christmas, she started eat less, But patient has been able to perform ADLs, even driving to work on March 15. She is now very weak not able to lift lower extremity against gravity,bilateral r arm are very weak as well. MRI of the brain was repeated and no acute findings were found. Neurology (Dr. Otelia Limes) consulted to assess movement disorder, possible myopathy. MRI spine no acute findings, per neurology Dr. 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 Due to anticoagulation for PE treatment, not able to perform LP safely. For this reason Dr. Otelia Limes recommended empiric treatment with IVIG. This has now completed. Neurology has reassessed the patient today. They feel that she has improved and recommends PT/OT. I appreciate neurology's  assistance.  Anti GQ1b antibody ordered. Pending. I appreciate neurology's assistance.  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 Nutrition has been consulted and calorie count has been initiation. She has been started on Remeron for appetite stimulant, continue nutrition supplement. Esophagram and orthopantogram ordered on March 27th, It does not appear to have done, presumably due to lethargy and does not take oral meds reliably. She has receiving IVIG treatment as per neurology. 48 hour calorie count over the weekend demonstrates that the patient oral intake remains low. However, she does drink ensure without difficulty. Per nutrition if she is able to take 4 cans of ensure daily, she will meed 80% of her nutritional needs. If she is not able to increase her nutritional intake, we will have to consider feeding tube. Psychiatry was consulted to address her anorexia. They have started the patient on remeron at hs. They expect that this will address her appetite and depressive symptoms.  Ascending colon colitis, incidental finding on CT scan during malignancy work-up: Patient denies any nausea or vomiting and abdominal pain. Blood culture have shown no growth. She has been treated with 10 days of antibiotics including Rocephin , Zosyn then oral Augmentin. She was also started on Diflucan on March 29. It was discontinued on April 4.  Bilateral pulmonary embolism(incidental finding on ct chest during malignancy work-up) Patient was started on IV heparin. She has now been transitioned to oral Eliquis. Venous duplex of the lower extremities shows acute DVT involving the single left posterior tibial vein as well. Echocardiogram ordered showed left ventricular ejection fraction of 60 to 65% without any regional wall abnormalities. Left  ventricular diastolic parameters are consistent with grade 1 diastolic dysfunction. No right heart strain seen.  Hematuria: UA has demonstrated triple phosphate crystals. Urine culture has been ordered. I will start the patient on IV Antibiotics to cover for klebsiella pneumonia and proteus species. Thus far repeat urine culture has had no growth.  Thrombocytopenia, acute on chronic: Platelet 88 on presentation 113 on 08/14/2019. Likely due to PE and DVT  Urinary retention, Foley inserted on April 3. Removed today. Voiding trial ordered. Monitored.  AKI superimposed on stage II CKD: Creatinine 1.38 on presentation, 08/14/2019 is 0.76. Repeat BMP in the morning. Monitor  renal function.  Hypokalemia: supplement and monitor. remain low continue to replace K.   History of recurrent CVA: CVA on 12/25/2018 and 03/27/2019(has loop recorder). MRI brain obtained March 25 demonstrated no acute CVA. Continue Plavix and statin. She is on Eliquis now due to PE.  Constipation; appear resolved.  Psychosis: Hallucinations resolved.  Body mass index is 33.95 kg/m. Obesity complicates all cares.   First Covid vaccine  [employer] )on March 13. Second dose due on 4/7. The patient will have to have second dose given when she leaves the hospital.  The patient has been seen and examined by me. I have spent 34 minutes in her evaluation and care.  DVT Prophylaxis: On Eliquis Code Status: DNR Family Communication: I have discussed the patient with her sister, Alethia Berthold. All questions answered to the best of my ability. They are considering hospice if the patient's oral intake does not pick-up. Disposition Plan: The patient came from home. Anticipate discharge to SNF vs [facility] PT with 24 /7/ supervision. Barriers to dc include poor oral intake, and difficult to place due to lack of insurance.  Randol Zumstein, DO [facility] Direct contact: see [email or web address]  7PM-7AM contact night coverage as  above 08/19/2019, 3:04 PM  LOS: 14 days

## 2019-08-19 NOTE — Plan of Care (Signed)
  Problem: Education: Goal: Knowledge of General Education information will improve Description: Including pain rating scale, medication(s)/side effects and non-pharmacologic comfort measures Outcome: Progressing   Problem: Clinical Measurements: Goal: Will remain free from infection Outcome: Progressing Goal: Diagnostic test results will improve Outcome: Progressing Goal: Respiratory complications will improve Outcome: Progressing Goal: Cardiovascular complication will be avoided Outcome: Progressing   Problem: Activity: Goal: Risk for activity intolerance will decrease Outcome: Progressing   Problem: Nutrition: Goal: Adequate nutrition will be maintained Outcome: Progressing   Problem: Coping: Goal: Level of anxiety will decrease Outcome: Progressing   Problem: Elimination: Goal: Will not experience complications related to bowel motility Outcome: Progressing Goal: Will not experience complications related to urinary retention Outcome: Progressing   Problem: Pain Managment: Goal: General experience of comfort will improve Outcome: Progressing   Problem: Safety: Goal: Ability to remain free from injury will improve Outcome: Progressing   Problem: Skin Integrity: Goal: Risk for impaired skin integrity will decrease Outcome: Progressing   

## 2019-08-19 NOTE — [place] FL2 (Signed)
[place] MEDICAID FL2 LEVEL OF CARE SCREENING TOOL     IDENTIFICATION  Patient Name: Denise Santiago Birthdate: 1955/07/24 Sex: female Admission Date (Current Location): 07/26/2019  [facility] and [place] Number:  Producer, television/film/video and Address:  [facility] [place], [address] [place], [place] [address]      Provider Number: [id number]  Attending Physician Name and Address:  Fran Lowes, DO  Relative Name and Phone Number:  Alethia Berthold, [phone]    Current Level of Care: Hospital Recommended Level of Care: Skilled Nursing Facility Prior Approval Number:    Date Approved/Denied:   PASRR Number:    Discharge Plan: SNF    Current Diagnoses: Patient Active Problem List   Diagnosis Date Noted  . Pulmonary embolism (HCC) 08/04/2019  . DVT (deep venous thrombosis) (HCC) 08/04/2019  . Thrush, oral 08/04/2019  . Colitis 08/04/2019  . Malnutrition of moderate degree 08/03/2019  . Esophageal thrush (HCC)   . Palliative care encounter   . Palliative care by specialist   . Goals of care, counseling/discussion   . Xerostomia   . Dry lips   . Failure to thrive in adult   . Physical debility   . Major depressive disorder 08/01/2019  . AKI (acute kidney injury) (HCC) 07/27/2019  . Hyperlipidemia   . Generalized weakness   . Hypokalemia   . Hyponatremia   . Acute on chronic kidney failure (HCC)   . Thrombocytopenia (HCC)   . Elevated troponin   . Lactic acidosis   . Essential hypertension 03/19/2019  . Type 2 diabetes mellitus without complication (HCC) 03/19/2019  . Left hemiparesis (HCC) 03/19/2019  . CVA (cerebral vascular accident) (HCC) 03/19/2019  . Right hemiparesis (HCC) 03/19/2019  . Acute ischemic right MCA stroke (HCC) 12/10/2018    Orientation RESPIRATION BLADDER Height & Weight     Self, Time, Situation, Place  Normal Incontinent Weight: 178 lb 5.6 oz (80.9 kg) Height:  5\' 2"  (157.5 cm)  BEHAVIORAL SYMPTOMS/MOOD  NEUROLOGICAL BOWEL NUTRITION STATUS      Incontinent Diet(See discharge summary)  AMBULATORY STATUS COMMUNICATION OF NEEDS Skin   Extensive Assist Verbally Skin abrasions, Other (Comment)(Left Buttock, open blister. MASD Rand L groin)                       Personal Care Assistance Level of Assistance  Bathing, Feeding, Dressing Bathing Assistance: Maximum assistance Feeding assistance: Limited assistance Dressing Assistance: Maximum assistance     Functional Limitations Info  Sight, Hearing, Speech Sight Info: Adequate Hearing Info: Adequate Speech Info: Adequate    SPECIAL CARE FACTORS FREQUENCY  PT (By licensed PT), OT (By licensed OT)     PT Frequency: 5x week OT Frequency: 5x week            Contractures Contractures Info: Not present    Additional Factors Info  Insulin Sliding Scale, Code Status, Allergies Code Status Info: DNR Allergies Info: Sulfur   Insulin Sliding Scale Info: Insulin Aspart (Novolog) 0-15 u 3x day w/ meals. Insulin Aspart (Novolog) 0-5 u at bedtime.       Current Medications (08/19/2019):  This is the current hospital active medication list Current Facility-Administered Medications  Medication Dose Route Frequency Provider Last Rate Last Admin  . acetaminophen (TYLENOL) tablet 650 mg  650 mg Oral Q6H PRN Pahwani, Rinka R, MD   650 mg at 08/15/19 2252   Or  . acetaminophen (TYLENOL) suppository 650 mg  650 mg Rectal  Q6H PRN Pahwani, Rinka R, MD      . antiseptic oral rinse (BIOTENE) solution 15 mL  15 mL Mouth Rinse BID Rosezella Rumpf, NP   15 mL at 08/19/19 0847  . apixaban (ELIQUIS) tablet 5 mg  5 mg Oral BID Hosie Poisson, MD   5 mg at 08/19/19 0846  . Chlorhexidine Gluconate Cloth 2 % PADS 6 each  6 each Topical Daily Florencia Reasons, MD   6 each at 08/19/19 0847  . clopidogrel (PLAVIX) tablet 75 mg  75 mg Oral Daily Pahwani, Rinka R, MD   75 mg at 08/19/19 0846  . dronabinol (MARINOL) capsule 2.5 mg  2.5 mg Oral BID AC Swayze, Ava,  DO   2.5 mg at 08/19/19 1158  . feeding supplement (ENSURE ENLIVE) (ENSURE ENLIVE) liquid 237 mL  237 mL Oral QID Swayze, Ava, DO   237 mL at 08/19/19 0847  . hydrALAZINE (APRESOLINE) tablet 25 mg  25 mg Oral Q8H Hosie Poisson, MD   25 mg at 08/19/19 0535  . insulin aspart (novoLOG) injection 0-15 Units  0-15 Units Subcutaneous TID WC Pahwani, Rinka R, MD   2 Units at 08/19/19 0702  . insulin aspart (novoLOG) injection 0-5 Units  0-5 Units Subcutaneous QHS Pahwani, Rinka R, MD   3 Units at 07/27/19 2205  . latanoprost (XALATAN) 0.005 % ophthalmic solution 1 drop  1 drop Both Eyes QHS Pahwani, Rinka R, MD   1 drop at 08/18/19 2210  . lisinopril (ZESTRIL) tablet 20 mg  20 mg Oral Daily Hosie Poisson, MD   20 mg at 08/19/19 0846  . mirtazapine (REMERON) tablet 15 mg  15 mg Oral QHS Swayze, Ava, DO   15 mg at 08/18/19 2210  . nicotine (NICODERM CQ - dosed in mg/24 hours) patch 21 mg  21 mg Transdermal Daily Radene Gunning, NP   21 mg at 08/19/19 0845  . nystatin (MYCOSTATIN) 100000 UNIT/ML suspension 500,000 Units  5 mL Oral QID Dellinger, Marianne L, PA-C   500,000 Units at 08/19/19 0846  . ondansetron (ZOFRAN) tablet 4 mg  4 mg Oral Q6H PRN Pahwani, Rinka R, MD       Or  . ondansetron (ZOFRAN) injection 4 mg  4 mg Intravenous Q6H PRN Pahwani, Rinka R, MD   4 mg at 08/02/19 1858  . potassium chloride SA (KLOR-CON) CR tablet 20 mEq  20 mEq Oral Daily Opyd, Ilene Qua, MD   20 mEq at 08/19/19 0203  . sodium chloride flush (NS) 0.9 % injection 3 mL  3 mL Intravenous Once Fredia Sorrow, MD      . vitamin A & D ointment   Topical PRN Drucilla Chalet Freada Bergeron, NP         Discharge Medications: Please see discharge summary for a list of discharge medications.  Relevant Imaging Results:  Relevant Lab Results:   Additional Information SS# [id number]  Kirstie Peri, Student-Social Work

## 2019-08-19 NOTE — Progress Notes (Signed)
Occupational Therapy Treatment Patient Details Name: Denise Santiago MRN: [id number] DOB: 1956/03/21 Today's Date: 08/19/2019    History of present illness 64 y.o. female with a hx of DM, HTN, HLD, CVA 12/2018 & 03/2019, ongoing tob use who presented to ED with generalized weakness and elevated troponin, thought to be non specific by cardiology. pt reports weakness after COVID vaccine 07/16/19. The day prior to admission pt slid out of chair to floor and remained through the night.    OT comments  Pt seen for OT follow up with focus on neuro facilitation techniques to manage extensor tone. Pt presents with decerebrate posture with RUE and extreme ataxia with LUE. Pt completed supine <> sit with max-total A +2. Once EOB, stedy used to facilitate knee flexion/block and bar on stedy used as reaching point to help break truncal extensor tone. Pt having inappropriate pain and temperature responses throughout session, not able to tolerate holding onto stedy handle due to cool temperature and feeling pain with minimal UE assist. Ended session in chair position to prolong and stimulate flexion posture to break up extensor tone. Pt not able to safely attempt standing due to safety risks with extreme extensor tone. D/c recs remain appropriate. OT will continue to follow.    Follow Up Recommendations  SNF;Supervision/Assistance - 24 hour    Equipment Recommendations  Other (comment);Hospital bed;Wheelchair cushion (measurements OT);Wheelchair (measurements OT);3 in 1 bedside commode    Recommendations for Other Services      Precautions / Restrictions Precautions Precautions: Fall Precaution Comments: reports falls since COVID vaccine Restrictions Weight Bearing Restrictions: No       Mobility Bed Mobility Overal bed mobility: Needs Assistance Bed Mobility: Supine to Sit;Sit to Supine     Supine to sit: Max assist;+2 for physical assistance Sit to supine: Total assist;+2 for physical  assistance   General bed mobility comments: hand over hand assist to reach for rails; multimodal cues for sequencing; assistance required to bring bilat LE/hips to EOB and then to elevate trunk into sitting; total A +2 to return to supine   Transfers Overall transfer level: Needs assistance Equipment used: Ambulation equipment used             General transfer comment: attempted to use stedy, pt not able to safely stand due to extensor tone. stedy used as knee block EOB and reaching point to stimulate trunk flexion    Balance Overall balance assessment: Needs assistance Sitting-balance support: Bilateral upper extremity supported;Feet supported Sitting balance-Leahy Scale: Zero Sitting balance - Comments: worked on trunk flexion and lateral rotation to promote decreased extension tone; pt requires max A to maintain sitting balance EOB ; Stedy standing frame utilized to block knees in flexion and for reaching activities to bar  Postural control: Posterior lean;Other (comment)(ext tone)                                 ADL either performed or assessed with clinical judgement   ADL Overall ADL's : Needs assistance/impaired Eating/Feeding: Maximal assistance;Sitting Eating/Feeding Details (indicate cue type and reason): sitting EOB pt requiring max A to self feed with R hand. With food already scooped on spoon, pt able to bring spoon to mouth and clear contents with max A and VC's. Poor strength, coordination, and awareness limiting  General ADL Comments: Session largely focused on EOB and chair position activity to break extensor tone     Vision Baseline Vision/History: Wears glasses Additional Comments: functional for assessment this date, will continue to assess as cognition improves   Perception     Praxis      Cognition Arousal/Alertness: Awake/alert Behavior During Therapy: Flat affect Overall Cognitive Status:  Impaired/Different from baseline Area of Impairment: Memory;Following commands;Safety/judgement;Awareness;Problem solving;Orientation                 Orientation Level: Disoriented to;Time;Situation(stated the month was May, difficulty recalling situation) Current Attention Level: Focused Memory: Decreased short-term memory Following Commands: Follows one step commands with increased time;Follows one step commands inconsistently Safety/Judgement: Decreased awareness of safety;Decreased awareness of deficits Awareness: Intellectual Problem Solving: Slow processing;Decreased initiation;Difficulty sequencing;Requires verbal cues;Requires tactile cues General Comments: pt becomes less interactive with mobility. At times using inappropriate words or phrases for items. Difficulty maintaining attention and carrying out entirety of tasks        Exercises     Shoulder Instructions       General Comments pt with tendency for trunk extension and R UE decerebrate posturing; bilat UE with poor coordination however L > R; ataxic movements all extremities     Pertinent Vitals/ Pain       Pain Assessment: Faces Faces Pain Scale: Hurts little more Pain Location: inappropriate responses to tactile input and temperature sensitivity Pain Descriptors / Indicators: Guarding;Grimacing Pain Intervention(s): Monitored during session  Home Living                                          Prior Functioning/Environment              Frequency  Min 2X/week        Progress Toward Goals  OT Goals(current goals can now be found in the care plan section)  Progress towards OT goals: Progressing toward goals  Acute Rehab OT Goals OT Goal Formulation: Patient unable to participate in goal setting Time For Goal Achievement: 09/02/19 Potential to Achieve Goals: Fair  Plan Discharge plan remains appropriate    Co-evaluation    PT/OT/SLP Co-Evaluation/Treatment: Yes Reason  for Co-Treatment: Complexity of the patient's impairments (multi-system involvement);For patient/therapist safety;To address functional/ADL transfers PT goals addressed during session: Mobility/safety with mobility;Balance;Strengthening/ROM OT goals addressed during session: ADL's and self-care;Proper use of Adaptive equipment and DME;Strengthening/ROM      AM-PAC OT "6 Clicks" Daily Activity     Outcome Measure   Help from another person eating meals?: A Lot Help from another person taking care of personal grooming?: A Lot Help from another person toileting, which includes using toliet, bedpan, or urinal?: Total Help from another person bathing (including washing, rinsing, drying)?: Total Help from another person to put on and taking off regular upper body clothing?: Total Help from another person to put on and taking off regular lower body clothing?: Total 6 Click Score: 8    End of Session    OT Visit Diagnosis: Unsteadiness on feet (R26.81);Muscle weakness (generalized) (M62.81);Other symptoms and signs involving cognitive function;Other symptoms and signs involving the nervous system (R29.898)   Activity Tolerance Patient tolerated treatment well   Patient Left in bed;with call bell/phone within reach;with bed alarm set   Nurse Communication Mobility status;Precautions        Time: 9381-8299 OT Time Calculation (min): 32 min  Charges:  OT General Charges $OT Visit: 1 Visit OT Treatments $Neuromuscular Re-education: 8-22 mins  Dalphine Handing, MSOT, OTR/L Acute Rehabilitation Services [facility] Office Number: [phone] Pager: [phone]  Dalphine Handing 08/19/2019, 6:20 PM

## 2019-08-19 NOTE — TOC Initial Note (Addendum)
Transition of Care [facility]) - Initial/Assessment Note    Patient Details  Name: Denise Santiago MRN: [id number] Date of Birth: 06-Dec-1955  [facility] CM/SW Contact:    Terrilee Croak, Student-Social Work Phone Number: 08/19/2019, 12:18 PM  Clinical Narrative:                  MSW Intern went to speak with pt about SNF and placement options. Pt is agreeable to SNF, but does not know where she would like to go. She agreed to be faxed out and asked that her sister be called and updated. Sister was called and voicemail left. FL2 completed and information faxed out.  Addended 1:00 pm 08/19/2018 Pt's sister returned the phone call and stated she would like for the pt to be out near [place] or [place] area. She would also be open to the facilities around the hospital, including [facility]. Pt was further faxed out.  Expected Discharge Plan: Skilled Nursing Facility Barriers to Discharge: Inadequate or no insurance, Continued Medical Work up, SNF Pending payor source - LOG   Patient Goals and CMS Choice Patient states their goals for this hospitalization and ongoing recovery are:: Patient agreeable to SNF in order to go home safely afterward. CMS Medicare.gov Compare Post Acute Care list provided to:: Patient Choice offered to / list presented to : Patient  Expected Discharge Plan and Services Expected Discharge Plan: Skilled Nursing Facility     Post Acute Care Choice: Skilled Nursing Facility Living arrangements for the past 2 months: Single Family Home                                      Prior Living Arrangements/Services Living arrangements for the past 2 months: Single Family Home Lives with:: Self Patient language and need for interpreter reviewed:: Yes Do you feel safe going back to the place where you live?: Yes      Need for Family Participation in Patient Care: No (Comment) Care giver support system in place?: No (comment)   Criminal  Activity/Legal Involvement Pertinent to Current Situation/Hospitalization: No - Comment as needed  Activities of Daily Living      Permission Sought/Granted Permission sought to share information with : Facility Industrial/product designer granted to share information with : Yes, Verbal Permission Granted  Share Information with NAME: Marcelino Duster  Permission granted to share info w AGENCY: SNF  Permission granted to share info w Relationship: Sister  Permission granted to share info w Contact Information: [phone]  Emotional Assessment Appearance:: Appears stated age Attitude/Demeanor/Rapport: Engaged Affect (typically observed): Appropriate Orientation: : Oriented to Self, Oriented to Place, Oriented to  Time, Oriented to Situation Alcohol / Substance Use: Not Applicable Psych Involvement: Yes (comment)(Psych consulted and signed off for anorexia and major depressive disorder.)  Admission diagnosis:  Hypokalemia [E87.6] Elevated troponin [R77.8] Generalized weakness [R53.1] AKI (acute kidney injury) (HCC) [N17.9] Patient Active Problem List   Diagnosis Date Noted  . Pulmonary embolism (HCC) 08/04/2019  . DVT (deep venous thrombosis) (HCC) 08/04/2019  . Thrush, oral 08/04/2019  . Colitis 08/04/2019  . Malnutrition of moderate degree 08/03/2019  . Esophageal thrush (HCC)   . Palliative care encounter   . Palliative care by specialist   . Goals of care, counseling/discussion   . Xerostomia   . Dry lips   . Failure to thrive in adult   . Physical debility   .  Major depressive disorder 08/01/2019  . AKI (acute kidney injury) ([place]) 07/27/2019  . Hyperlipidemia   . Generalized weakness   . Hypokalemia   . Hyponatremia   . Acute on chronic kidney failure ([place])   . Thrombocytopenia ([place])   . Elevated troponin   . Lactic acidosis   . Essential hypertension 03/19/2019  . Type 2 diabetes mellitus without complication ([place]) 16/05/930  . Left hemiparesis ([place])  03/19/2019  . CVA (cerebral vascular accident) ([place]) 03/19/2019  . Right hemiparesis ([place]) 03/19/2019  . Acute ischemic right MCA stroke ([place]) 12/10/2018   PCP:  Seward Carol, MD Pharmacy:   [facility] [address], [place] [address] [place] [address] Phone: [phone] Fax: [phone]     Social Determinants of Health (SDOH) Interventions    Readmission Risk Interventions No flowsheet data found.

## 2019-08-19 NOTE — Progress Notes (Addendum)
NEUROLOGY PROGRESS NOTE   Subjective: Patient feels as though she is getting slightly better.  She has no complaints at this time.  Exam: Vitals:   08/19/19 0439 08/19/19 0800  BP: 140/69 (!) 146/72  Pulse: 94 87  Resp: 18 16  Temp: 98.1 F (36.7 C) 98 F (36.7 C)  SpO2: 100% 100%    ROS General ROS: negative for - chills, fatigue, fever, night sweats, weight gain or weight loss Psychological ROS: negative for - behavioral disorder, hallucinations, memory difficulties, mood swings or suicidal ideation Ophthalmic ROS: negative for - blurry vision, double vision, eye pain or loss of vision Respiratory ROS: negative for - cough, hemoptysis, shortness of breath or wheezing Cardiovascular ROS: negative for - chest pain, dyspnea on exertion, edema or irregular heartbeat Gastrointestinal ROS: negative for - abdominal pain, diarrhea, hematemesis, nausea/vomiting or stool incontinence Genito-Urinary ROS: Positive for -urinary retention Musculoskeletal ROS: Positive for - muscular weakness Neurological ROS: as noted in HPI Dermatological ROS: negative for rash and skin lesion changes   Physical Exam  Constitutional: Appears well-developed and well-nourished.  Psych: Affect appropriate to situation Eyes: No scleral injection HENT: No OP obstrucion Head: Normocephalic.  Cardiovascular: Pulse palpable.  Respiratory: Effort normal, non-labored breathing GI: Soft.  No distension. There is no tenderness.  Skin: WDI   Neuro:  Mental Status: Alert, oriented, often times tends to answer my question but with the wrong answer however with redirection she answers it correctly.  Speech slightly dysarthric without evidence of aphasia.  Able to follow simple commands without difficulty. Cranial Nerves: II:  Visual fields grossly normal,  III,IV, VI: ptosis not present, extra-ocular motions intact bilaterally pupils equal, round, reactive to light and accommodation V,VII: smile symmetric,  facial light touch sensation normal bilaterally VIII: hearing normal bilaterally XII: midline tongue extension Motor: Right : Upper extremity    Left:     Upper extremity 4/5 deltoid       4/5 deltoid 5/5 tricep      4-/5 tricep 4/5 biceps      5/5 biceps  4/5wrist flexion     4+/5 wrist flexion 4/5 wrist extension     4+/5 wrist extension 5/5 hand grip      5/5 hand grip  Lower extremity     Lower extremity 4-/5 hip flexor      4+/5 hip flexor 3/5 hip adductors     3/5 hip adductors 3/5 hip abductors     3/5 hip abductors 3/5 quadricep      4/5 quadriceps  3/5 hamstrings     4/5 hamstrings 5/5 plantar flexion       5/5 plantar flexion 4/5 plantar extension     5/5 plantar extension Tone and bulk:normal tone throughout; no atrophy noted Sensory: No proprioception at the toe, light touch, pinprick, temperature decreased up to knee. Deep Tendon Reflexes: 2+ and symmetric throughout upper extremities with no knee jerk or ankle jerk Plantars: Mute bilaterally Cerebellar: Significant dysmetria with finger-to-nose out of proportion to weakness.      Medications:  Scheduled: . antiseptic oral rinse  15 mL Mouth Rinse BID  . apixaban  5 mg Oral BID  . Chlorhexidine Gluconate Cloth  6 each Topical Daily  . clopidogrel  75 mg Oral Daily  . dronabinol  2.5 mg Oral BID AC  . feeding supplement (ENSURE ENLIVE)  237 mL Oral QID  . hydrALAZINE  25 mg Oral Q8H  . insulin aspart  0-15 Units Subcutaneous TID WC  . insulin  aspart  0-5 Units Subcutaneous QHS  . latanoprost  1 drop Both Eyes QHS  . lisinopril  20 mg Oral Daily  . mirtazapine  15 mg Oral QHS  . nicotine  21 mg Transdermal Daily  . nystatin  5 mL Oral QID  . potassium chloride  20 mEq Oral Daily  . sodium chloride flush  3 mL Intravenous Once    HXT:[id number] **OR** acetaminophen, ondansetron **OR** ondansetron (ZOFRAN) IV, vitamin A & D  Pertinent Labs/Diagnostics: GQ 1B antibodies pending   Etta Quill  PA-C [facility] [phone]  Assessment:  This is a 64 year old female with progressive bilateral upper and lower extremity weakness.  Unable to obtain LP secondary to history of PE and on apixaban.  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  Patient has received IVIG x5 with last dose on April 7.  She is clearly responding to IVIG, endorsing improvement.  Involvement of autonomic's (urinary retention) goes along with Guillain-Barr syndrome.  Her response to IVIG is strongly supportive of an autoimmune nature.  At this point, I think that LP would not be as helpful given that IVIG can often change the results.  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 -Urinary retention  Recommendations: -Considering CIR at this time -Continue PT/OT -GQ 1B antibody still pending  Roland Rack, MD [facility] [phone]  If 7pm- 7am, please page neurology on call as listed in [place].  08/19/2019, 8:53 AM

## 2019-08-19 NOTE — Progress Notes (Signed)
Physical Therapy Treatment Patient Details Name: Denise Santiago MRN: [id number] DOB: 1956-03-12 Today's Date: 08/19/2019    History of Present Illness 64 y.o. female with a hx of DM, HTN, HLD, CVA 12/2018 & 03/2019, ongoing tob use who presented to ED with generalized weakness and elevated troponin, thought to be non specific by cardiology. pt reports weakness after COVID vaccine 07/16/19. The day prior to admission pt slid out of chair to floor and remained through the night.     PT Comments    Patient continues to present with impaired mobility and cognition requiring +2 assist for all mobility. Pt with ataxia and decerebrate posturing of R UE.  At times pt demonstrates inappropriate response to pain and temperature sensitivity. This sess focused on bed mobility and sitting balance with emphasis on breaking extensor tone. PT will continue to follow acutely and progress as tolerated with anticipated d/c to post acute rehab.    Follow Up Recommendations  Supervision/Assistance - 24 hour;SNF     Equipment Recommendations  None recommended by PT    Recommendations for Other Services Speech consult     Precautions / Restrictions Precautions Precautions: Fall Restrictions Weight Bearing Restrictions: No    Mobility  Bed Mobility Overal bed mobility: Needs Assistance Bed Mobility: Supine to Sit;Sit to Supine     Supine to sit: Max assist;+2 for physical assistance Sit to supine: Total assist;+2 for physical assistance   General bed mobility comments: hand over hand assist to reach for rails; multimodal cues for sequencing; assistance required to bring bilat LE/hips to EOB and then to elevate trunk into sitting; total A +2 to return to supine   Transfers                    Ambulation/Gait                 Stairs             Wheelchair Mobility    Modified Rankin (Stroke Patients Only) Modified Rankin (Stroke Patients Only) Pre-Morbid Rankin Score:  No significant disability Modified Rankin: Severe disability     Balance Overall balance assessment: Needs assistance Sitting-balance support: Bilateral upper extremity supported;Feet supported Sitting balance-Leahy Scale: Zero(poor at times but breifly) Sitting balance - Comments: worked on trunk flexion and lateral rotation to promote decreased extension tone; pt requires max A to maintain sitting balance EOB ; Stedy standing frame utilized to block knees in flexion and for reaching activities to bar                                     Cognition Arousal/Alertness: Lethargic;Awake/alert Behavior During Therapy: Flat affect Overall Cognitive Status: Impaired/Different from baseline Area of Impairment: Memory;Following commands;Safety/judgement;Awareness;Problem solving;Orientation                 Orientation Level: Disoriented to;Time;Situation Current Attention Level: Sustained Memory: Decreased short-term memory Following Commands: Follows one step commands with increased time;Follows one step commands inconsistently Safety/Judgement: Decreased awareness of safety;Decreased awareness of deficits Awareness: Intellectual Problem Solving: Slow processing;Decreased initiation;Difficulty sequencing;Requires verbal cues;Requires tactile cues General Comments: pt initially interactive/talkative and then less interactive with mobility      Exercises      General Comments General comments (skin integrity, edema, etc.): pt with tendency for trunk extension and R UE decerebrate posturing; bilat UE with poor coordination however L > R; ataxic movements all extremities  Pertinent Vitals/Pain Pain Assessment: Faces Faces Pain Scale: Hurts little more Pain Location: inappropriate responses to tactile input and temperature sensitivity Pain Descriptors / Indicators: Guarding;Grimacing Pain Intervention(s): Monitored during session    Home Living                       Prior Function            PT Goals (current goals can now be found in the care plan section) Progress towards PT goals: Progressing toward goals    Frequency    Min 3X/week      PT Plan Current plan remains appropriate    Co-evaluation PT/OT/SLP Co-Evaluation/Treatment: Yes Reason for Co-Treatment: Complexity of the patient's impairments (multi-system involvement);Necessary to address cognition/behavior during functional activity;For patient/therapist safety;To address functional/ADL transfers PT goals addressed during session: Mobility/safety with mobility;Balance;Strengthening/ROM        AM-PAC PT "6 Clicks" Mobility   Outcome Measure  Help needed turning from your back to your side while in a flat bed without using bedrails?: A Lot Help needed moving from lying on your back to sitting on the side of a flat bed without using bedrails?: A Lot Help needed moving to and from a bed to a chair (including a wheelchair)?: Total Help needed standing up from a chair using your arms (e.g., wheelchair or bedside chair)?: Total Help needed to walk in hospital room?: Total Help needed climbing 3-5 steps with a railing? : Total 6 Click Score: 8    End of Session Equipment Utilized During Treatment: Gait belt Activity Tolerance: Patient limited by fatigue Patient left: with call bell/phone within reach;in bed;with bed alarm set Nurse Communication: Mobility status;Need for lift equipment PT Visit Diagnosis: Muscle weakness (generalized) (M62.81);Difficulty in walking, not elsewhere classified (R26.2)     Time: 8841-6606 PT Time Calculation (min) (ACUTE ONLY): 31 min  Charges:  $Therapeutic Activity: 8-22 mins                     Earney Navy, PTA Acute Rehabilitation Services Pager: [phone] Office: [phone]     Darliss Cheney 08/19/2019, 5:19 PM

## 2019-08-19 NOTE — Progress Notes (Signed)
In and out urinary cath, MD paged and secure text. I will pass on to next shift about urinary retention.

## 2019-08-20 LAB — CBC WITH DIFFERENTIAL/PLATELET
Abs Immature Granulocytes: 0 10*3/uL (ref 0.00–0.07)
Basophils Absolute: 0 10*3/uL (ref 0.0–0.1)
Basophils Relative: 0 %
Eosinophils Absolute: 0 10*3/uL (ref 0.0–0.5)
Eosinophils Relative: 1 %
HCT: 35.4 % — ABNORMAL LOW (ref 36.0–46.0)
Hemoglobin: 11.7 g/dL — ABNORMAL LOW (ref 12.0–15.0)
Lymphocytes Relative: 8 %
Lymphs Abs: 0.3 10*3/uL — ABNORMAL LOW (ref 0.7–4.0)
MCH: 30.2 pg (ref 26.0–34.0)
MCHC: 33.1 g/dL (ref 30.0–36.0)
MCV: 91.2 fL (ref 80.0–100.0)
Monocytes Absolute: 0.3 10*3/uL (ref 0.1–1.0)
Monocytes Relative: 8 %
Neutro Abs: 3 10*3/uL (ref 1.7–7.7)
Neutrophils Relative %: 83 %
Platelets: 158 10*3/uL (ref 150–400)
RBC: 3.88 MIL/uL (ref 3.87–5.11)
RDW: 16.1 % — ABNORMAL HIGH (ref 11.5–15.5)
WBC: 3.6 10*3/uL — ABNORMAL LOW (ref 4.0–10.5)
nRBC: 0 % (ref 0.0–0.2)
nRBC: 0 /100 WBC

## 2019-08-20 LAB — BASIC METABOLIC PANEL
Anion gap: 8 (ref 5–15)
BUN: 9 mg/dL (ref 8–23)
CO2: 23 mmol/L (ref 22–32)
Calcium: 8.6 mg/dL — ABNORMAL LOW (ref 8.9–10.3)
Chloride: 109 mmol/L (ref 98–111)
Creatinine, Ser: 0.75 mg/dL (ref 0.44–1.00)
GFR calc Af Amer: >60 mL/min (ref >60)
GFR calc non Af Amer: >60 mL/min (ref >60)
Glucose, Bld: 120 mg/dL — ABNORMAL HIGH (ref 70–99)
Potassium: 3.4 mmol/L — ABNORMAL LOW (ref 3.5–5.1)
Sodium: 140 mmol/L (ref 135–145)

## 2019-08-20 LAB — GLUCOSE, CAPILLARY
Glucose-Capillary: 113 mg/dL — ABNORMAL HIGH (ref 70–99)
Glucose-Capillary: 106 mg/dL — ABNORMAL HIGH (ref 70–99)
Glucose-Capillary: 87 mg/dL (ref 70–99)
Glucose-Capillary: 97 mg/dL (ref 70–99)

## 2019-08-20 MED ORDER — POTASSIUM CHLORIDE CRYS ER 20 MEQ PO TBCR
40.0000 meq | EXTENDED_RELEASE_TABLET | Freq: Two times a day (BID) | ORAL | Status: AC
  Administered 2019-08-20 (×2): 40 meq via ORAL
  Filled 2019-08-20 (×2): qty 2

## 2019-08-20 NOTE — Progress Notes (Signed)
PROGRESS NOTE    Denise Santiago  ZOX:[id number] DOB: 07/21/1955 DOA: 07/26/2019 PCP: Renford DillsPolite, Ronald, MD    Brief Narrative:  Denise Santiago a 64 y.o.femalewith medical history significant ofhypertension, diabetes mellitus, hyperlipidemia, CVA on 12/25/2018 and 03/27/2019(has loop recorder), tobacco abuse presents to emergency department due to generalized weakness.  Patient tells me that she received Covid vaccine last Saturday and since then she has been feeling weak, lethargic, has no energy and has decreased appetite. Yesterday she slid out of her chair onto the floor and was not able to get up due to generalized weakness. She stayed on the floor all night long. Her niece was with her at that time but she could not get her up as well. EMS was called this morning and brought here to the emergency department for further evaluation and management.  Patient denies headache, blurry vision, slurred speech, head trauma, loss of consciousness, seizures, chest pain, shortness of breath, palpitation, leg swelling, fever, chills, nausea, vomiting, diarrhea, cough, congestion, wheezing, dysuria or any other urinary symptoms.  She lives alone and uses walker and cane for ambulation. She continues to smoke 1 pack of cigarettes per day however denies alcohol, recent drug use.  As per the sister,patient has lost more than 50 pounds since November after her last stroke.On further questioning, pt reports that she has altered taste and no appetite whatso ever, along with loose teeth , preventing her to eat any solid food. Malignancy work up has been initiated and CT abd , pelvis and chest with contrast orderedOn 3/27forfurther evaluation. CT of the chest with contrast showed acute bilateral pulmonary embolism and CT of the abdomen showed ascending colon colitis.  Per sister patient started have strange behavior since Christmas, she startedtoeat less, But patient has been able to  perform ADLs, even driving to work on March 15.  The patient has been very drowsy and weak. She has a very poor PO intake. 48 hour calorie count over the weekend demonstrates that the patient oral intake remains low. However, she does drink ensure without difficulty. Per nutrition if she is able to take 4 cans of ensure daily, she will meed 80% of her nutritional needs. If she is not able to increase her nutritional intake, we will have to consider feeding tube. Psychiatry was consulted for her anorexia. They have started her on Remeron. This is expected to improve her appetite and address her depressive symptoms.  The patient has completed 5 days of IVIG, and has demonstrated improved strength. Continue PT/OT.  She received her first Covid vaccine on March 13,second dose scheduled on April 7.  Assessment & Plan:   Principal Problem:   Major depressive disorder Active Problems:   Essential hypertension   Type 2 diabetes mellitus without complication (HCC)   CVA (cerebral vascular accident) (HCC)   Hyperlipidemia   Generalized weakness   Hypokalemia   Hyponatremia   Acute on chronic kidney failure (HCC)   Thrombocytopenia (HCC)   Elevated troponin   Lactic acidosis   AKI (acute kidney injury) (HCC)   Palliative care by specialist   Goals of care, counseling/discussion   Xerostomia   Dry lips   Failure to thrive in adult   Physical debility   Malnutrition of moderate degree   Esophageal thrush (HCC)   Palliative care encounter   Pulmonary embolism (HCC)   DVT (deep venous thrombosis) (HCC)   Thrush, oral   Colitis  FTT/progressive weakness/poor oral intake with reported weight loss: Per sister patient started have  strange behavior since Christmas, she started eat less, But patient has been able to perform ADLs, even driving to work on March 15. She is now very weak not able to lift lower extremity against gravity,bilateral  arms are very weak as well. MRI of the brain was  repeated and no acute findings were found. Neurology (Dr. Otelia Limes) consulted to assess movement disorder, possible myopathy. MRI spine no acute findings, per neurology Dr. 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 Due to anticoagulation for PE treatment, not able to perform LP safely. For this reason Dr. Otelia Limes recommended empiric treatment with IVIG. This has now completed. Neurology has reassessed the patient today. They feel that she has improved and recommends PT/OT. I appreciate neurology's assistance.  Anti GQ1b antibody ordered. Pending. I appreciate neurology's assistance.  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 Nutrition has been consulted and calorie count has been initiation. She has been started on Remeron for appetite stimulant, continue nutrition supplement. Esophagram and orthopantogram ordered on March 27th, It does not appear to have done,presumably due to lethargy anddoes not take oral meds reliably. She has receiving IVIG treatment as per neurology. 48 hour calorie count over the weekend demonstrates that the patient oral intake remains low. However, she does drink ensure without difficulty. Per nutrition if she is able to take 4 cans of ensure daily, she will meed 80% of her nutritional needs. If she is not able to increase her nutritional intake, we will have to consider feeding tube. Psychiatry was consulted to address her anorexia. They have started the patient on remeron at hs. They expect that this will address her appetite and depressive symptoms.  Ascending colon colitis,incidental finding on CT scan during malignancy work-up: Patient denies any nausea or vomiting and abdominal pain. Blood culture have shown no growth. She has been treated with 10 days of antibiotics  including Rocephin,Zosyn thenoral Augmentin. She was also started on Diflucan on March 29. It was discontinued on April 4.  Bilateral pulmonary embolism(incidental findingon ct chestduring malignancy work-up) Patient was started on IV heparin. She has now been transitioned to oral Eliquis. Venous duplex of the lower extremities shows acute DVT involving the single left posterior tibial vein as well. Echocardiogram ordered showed left ventricular ejection fraction of 60 to 65% without any regional wall abnormalities. Left ventricular diastolic parameters are consistent with grade 1 diastolic dysfunction. No right heart strain seen.  Hematuria: UA has demonstrated triple phosphate crystals. Urine culture has been ordered. I will start the patient on IV Antibiotics to cover for klebsiella pneumonia and proteus species. Thus far repeat urine culture has had no growth.  Thrombocytopenia, acute on chronic: Platelet 88 on presentation 113 on 08/14/2019. Likely due to PE and DVT  Urinary retention,Foley inserted on April 3. Removed today. Voiding trial ordered. Monitored.  AKI superimposed on stage II CKD: Creatinine 1.38 on presentation, 08/14/2019 is 0.76. Repeat BMP in the morning. Monitor renal function.  Hypokalemia: supplement and monitor.remain low continue toreplace K.   History of recurrent CVA: CVA on 12/25/2018 and 03/27/2019(has loop recorder). MRI brain obtained March 25 demonstrated no acute CVA. Continue Plavix and statin. She is on Eliquis now due to PE.  Constipation; appear resolved.  Psychosis: Hallucinations resolved.  Body mass index is 33.95 kg/m.Obesity complicates all cares.   First Covid vaccine [employer] )on March 13. Second  dose due on 4/7. The patient will have to have second dose given when she leaves the hospital.  The patient has been seen and examined by me. I have spent 34 minutes in her evaluation and care.  DVT Prophylaxis: On Eliquis Code  Status:DNR Family Communication:We have discussed the patient with her sister, Tyrone Apple. All questions answered to the best of my ability. They are considering hospice if the patient's oral intake does not pick-up. Disposition Plan:The patient came from home. Anticipate discharge to SNF vs [facility] with 24 /7/ supervision. Barriers to dc include poor oral intake, and difficult to place due to lack of insurance.    Consultants:   Neurology  Dentistry  Palliative care  Psychiatry  Cardiology  Procedures:  Antimicrobials:  Anti-infectives (From admission, onward)   Start     Dose/Rate Route Frequency Ordered Stop   08/13/19 2330  piperacillin-tazobactam (ZOSYN) IVPB 3.375 g  Status:  Discontinued     3.375 g 12.5 mL/hr over 240 Minutes Intravenous Every 8 hours 08/13/19 1610 08/15/19 1249   08/13/19 1530  piperacillin-tazobactam (ZOSYN) IVPB 3.375 g  Status:  Discontinued     3.375 g 12.5 mL/hr over 240 Minutes Intravenous Every 8 hours 08/13/19 1412 08/13/19 1610   08/04/19 1015  amoxicillin-clavulanate (AUGMENTIN) 250-62.5 MG/5ML suspension 500 mg     500 mg Oral Every 12 hours 08/04/19 0844 08/08/19 2139   08/03/19 2000  fluconazole (DIFLUCAN) IVPB 100 mg  Status:  Discontinued     100 mg 50 mL/hr over 60 Minutes Intravenous Every 24 hours 08/03/19 1802 08/09/19 1813   08/01/19 1715  piperacillin-tazobactam (ZOSYN) IVPB 3.375 g  Status:  Discontinued     3.375 g 12.5 mL/hr over 240 Minutes Intravenous Every 8 hours 08/01/19 1712 08/04/19 0844   07/30/19 0000  cefTRIAXone (ROCEPHIN) 1 g in sodium chloride 0.9 % 100 mL IVPB  Status:  Discontinued     1 g 200 mL/hr over 30 Minutes Intravenous Every 24 hours 07/29/19 1420 07/29/19 1421   07/29/19 1421  cefTRIAXone (ROCEPHIN) 1 g in sodium chloride 0.9 % 100 mL IVPB  Status:  Discontinued     1 g 200 mL/hr over 30 Minutes Intravenous Every 24 hours 07/29/19 1421 08/01/19 0716      Subjective: Patient was seen and  examined at bedside.  No overnight events, she denies any concerns.  Objective: Vitals:   08/19/19 2005 08/20/19 0032 08/20/19 0300 08/20/19 0837  BP: (!) 159/90 (!) 162/82 (!) 150/91 (!) 180/92  Pulse: 97 99 98 93  Resp: 20 20 20 20   Temp: 98.2 F (36.8 C) 99.1 F (37.3 C) 98.3 F (36.8 C) 98.1 F (36.7 C)  TempSrc: Oral Oral Oral Axillary  SpO2: 100% 100% 98% 100%  Weight:      Height:        Intake/Output Summary (Last 24 hours) at 08/20/2019 1359 Last data filed at 08/20/2019 0630 Gross per 24 hour  Intake --  Output 700 ml  Net -700 ml   Filed Weights   08/17/19 0500 08/18/19 0448 08/19/19 0441  Weight: 84.2 kg 82.2 kg 80.9 kg    Examination:  General exam: Appears calm and comfortable  Respiratory system: Clear to auscultation. Respiratory effort normal. Cardiovascular system: S1 & S2 heard, RRR. No JVD, murmurs, rubs, gallops or clicks. No pedal edema. Gastrointestinal system: Abdomen is nondistended, soft and nontender. No organomegaly or masses felt. Normal bowel sounds heard. Central nervous system: Alert and oriented. No focal neurological  deficits. Extremities: Symmetric 5 x 5 power. Skin: No rashes, lesions or ulcers Psychiatry: Judgement and insight appear normal. Mood & affect appropriate.     Data Reviewed: I have personally reviewed following labs and imaging studies  CBC: Recent Labs  Lab 08/14/19 0013 08/17/19 0513 08/19/19 0035 08/20/19 0352  WBC 4.0 5.9 4.9 3.6*  NEUTROABS  --   --   --  3.0  HGB 11.4* 10.8* 12.1 11.7*  HCT 33.4* 33.2* 36.7 35.4*  MCV 89.1 92.2 92.2 91.2  PLT 113* 145* 153 158   Basic Metabolic Panel: Recent Labs  Lab 08/14/19 0013 08/17/19 0513 08/18/19 0339 08/19/19 0035 08/20/19 0352  NA 138 141 137 137 140  K 3.5 3.2* 3.1* 3.4* 3.4*  CL 105 105 104 105 109  CO2 25 27 24 22 23   GLUCOSE 120* 111* 141* 132* 120*  BUN 11 13 12 10 9   CREATININE 0.76 0.96 0.77 0.75 0.75  CALCIUM 8.5* 8.6* 8.5* 8.9 8.6*  MG   --   --   --  1.8  --    GFR: Estimated Creatinine Clearance: 70 mL/min (by C-G formula based on SCr of 0.75 mg/dL). Liver Function Tests: No results for input(s): AST, ALT, ALKPHOS, BILITOT, PROT, ALBUMIN in the last 168 hours. No results for input(s): LIPASE, AMYLASE in the last 168 hours. No results for input(s): AMMONIA in the last 168 hours. Coagulation Profile: No results for input(s): INR, PROTIME in the last 168 hours. Cardiac Enzymes: No results for input(s): CKTOTAL, CKMB, CKMBINDEX, TROPONINI in the last 168 hours. BNP (last 3 results) No results for input(s): PROBNP in the last 8760 hours. HbA1C: No results for input(s): HGBA1C in the last 72 hours. CBG: Recent Labs  Lab 08/19/19 1131 08/19/19 1605 08/19/19 2114 08/20/19 0641 08/20/19 1156  GLUCAP 98 104* 93 113* 106*   Lipid Profile: No results for input(s): CHOL, HDL, LDLCALC, TRIG, CHOLHDL, LDLDIRECT in the last 72 hours. Thyroid Function Tests: No results for input(s): TSH, T4TOTAL, FREET4, T3FREE, THYROIDAB in the last 72 hours. Anemia Panel: No results for input(s): VITAMINB12, FOLATE, FERRITIN, TIBC, IRON, RETICCTPCT in the last 72 hours. Sepsis Labs: No results for input(s): PROCALCITON, LATICACIDVEN in the last 168 hours.  Recent Results (from the past 240 hour(s))  Culture, Urine     Status: None   Collection Time: 08/13/19  2:06 PM   Specimen: Urine, Catheterized  Result Value Ref Range Status   Specimen Description URINE, CATHETERIZED  Final   Special Requests NONE  Final   Culture   Final    NO GROWTH Performed at [facility], [address] [place], [address] [place]    Report Status 08/14/2019 FINAL  Final     Radiology Studies: No results found.   Scheduled Meds: . antiseptic oral rinse  15 mL Mouth Rinse BID  . apixaban  5 mg Oral BID  . Chlorhexidine Gluconate Cloth  6 each Topical Daily  . clopidogrel  75 mg Oral Daily  . dronabinol  2.5 mg Oral BID AC  . feeding  supplement (ENSURE ENLIVE)  237 mL Oral QID  . hydrALAZINE  25 mg Oral Q8H  . insulin aspart  0-15 Units Subcutaneous TID WC  . insulin aspart  0-5 Units Subcutaneous QHS  . latanoprost  1 drop Both Eyes QHS  . lisinopril  20 mg Oral Daily  . mirtazapine  15 mg Oral QHS  . nicotine  21 mg Transdermal Daily  . nystatin  5 mL Oral QID  . potassium chloride  20 mEq Oral Daily  . potassium chloride SA  40 mEq Oral BID  . sodium chloride flush  3 mL Intravenous Once   Continuous Infusions:   LOS: 24 days    Time spent: 25 mins    Cipriano Bunker, MD [facility]   If 7PM-7AM, please contact night-coverage

## 2019-08-21 LAB — GLUCOSE, CAPILLARY
Glucose-Capillary: 141 mg/dL — ABNORMAL HIGH (ref 70–99)
Glucose-Capillary: 88 mg/dL (ref 70–99)
Glucose-Capillary: 98 mg/dL (ref 70–99)
Glucose-Capillary: 96 mg/dL (ref 70–99)

## 2019-08-21 LAB — BASIC METABOLIC PANEL
Anion gap: 9 (ref 5–15)
BUN: 9 mg/dL (ref 8–23)
CO2: 21 mmol/L — ABNORMAL LOW (ref 22–32)
Calcium: 8.9 mg/dL (ref 8.9–10.3)
Chloride: 112 mmol/L — ABNORMAL HIGH (ref 98–111)
Creatinine, Ser: 0.85 mg/dL (ref 0.44–1.00)
GFR calc Af Amer: >60 mL/min (ref >60)
GFR calc non Af Amer: >60 mL/min (ref >60)
Glucose, Bld: 114 mg/dL — ABNORMAL HIGH (ref 70–99)
Potassium: 3.9 mmol/L (ref 3.5–5.1)
Sodium: 142 mmol/L (ref 135–145)

## 2019-08-21 LAB — CBC
HCT: 34.2 % — ABNORMAL LOW (ref 36.0–46.0)
HCT: 34.5 % — ABNORMAL LOW (ref 36.0–46.0)
Hemoglobin: 11.1 g/dL — ABNORMAL LOW (ref 12.0–15.0)
Hemoglobin: 11.4 g/dL — ABNORMAL LOW (ref 12.0–15.0)
MCH: 30 pg (ref 26.0–34.0)
MCH: 30.4 pg (ref 26.0–34.0)
MCHC: 32.5 g/dL (ref 30.0–36.0)
MCHC: 33 g/dL (ref 30.0–36.0)
MCV: 92 fL (ref 80.0–100.0)
MCV: 92.4 fL (ref 80.0–100.0)
Platelets: 158 10*3/uL (ref 150–400)
Platelets: 173 10*3/uL (ref 150–400)
RBC: 3.7 MIL/uL — ABNORMAL LOW (ref 3.87–5.11)
RBC: 3.75 MIL/uL — ABNORMAL LOW (ref 3.87–5.11)
RDW: 16.6 % — ABNORMAL HIGH (ref 11.5–15.5)
RDW: 16.6 % — ABNORMAL HIGH (ref 11.5–15.5)
WBC: 4.8 10*3/uL (ref 4.0–10.5)
WBC: 5.4 10*3/uL (ref 4.0–10.5)
nRBC: 0 % (ref 0.0–0.2)
nRBC: 0 % (ref 0.0–0.2)

## 2019-08-21 NOTE — Progress Notes (Signed)
Physical Therapy Treatment Patient Details Name: Denise Santiago MRN: [id number] DOB: [birth year] Today's Date: 08/21/2019    History of Present Illness 64 y.o. female with a hx of DM, HTN, HLD, CVA 12/2018 & 03/2019, ongoing tob use who presented to ED with generalized weakness and elevated troponin, thought to be non specific by cardiology. pt reports weakness after COVID vaccine 07/16/19. The day prior to admission pt slid out of chair to floor and remained through the night.     PT Comments    Patient presents with continued ataxia, poor proprioception, extensor tone, bilat UE decerebrate posturing at times, and impaired cognition. Pt with tangential thoughts and does not recognize this therapist from previous sessions. Pt demonstrates anxiety with postural changes which is new but unable to articulate reason. PT will continue to follow acutely and progress as tolerated and recommend post acute rehab for further skilled PT services.    Follow Up Recommendations  Supervision/Assistance - 24 hour;SNF     Equipment Recommendations  None recommended by PT    Recommendations for Other Services       Precautions / Restrictions Precautions Precautions: Fall Restrictions Weight Bearing Restrictions: No    Mobility  Bed Mobility Overal bed mobility: Needs Assistance Bed Mobility: Sit to Supine;Rolling;Sidelying to Sit Rolling: Max assist;+2 for physical assistance Sidelying to sit: Max assist;+2 for physical assistance   Sit to supine: Total assist;+2 for physical assistance   General bed mobility comments: pt initiated movement on R side with cues; hand over hand assist to reach for rails; multimodal cues for sequencing; assistance required to bring bilat LE/hips to EOB and then to elevate trunk into sitting; total A +2 to return to supine   Transfers                    Ambulation/Gait                 Stairs             Wheelchair Mobility     Modified Rankin (Stroke Patients Only) Modified Rankin (Stroke Patients Only) Pre-Morbid Rankin Score: No significant disability Modified Rankin: Severe disability     Balance Overall balance assessment: Needs assistance Sitting-balance support: Bilateral upper extremity supported;Feet supported Sitting balance-Leahy Scale: Zero Sitting balance - Comments: worked on trunk flexion and lateral rotation to promote decreased extension tone; pt requires max A to maintain sitting balance EOB  Postural control: Posterior lean;Left lateral lean                                  Cognition Arousal/Alertness: Awake/alert Behavior During Therapy: Flat affect Overall Cognitive Status: Impaired/Different from baseline Area of Impairment: Memory;Following commands;Safety/judgement;Problem solving;Orientation                 Orientation Level: Disoriented to;Time;Situation   Memory: Decreased short-term memory Following Commands: Follows one step commands with increased time;Follows one step commands inconsistently Safety/Judgement: Decreased awareness of safety;Decreased awareness of deficits   Problem Solving: Slow processing;Decreased initiation;Difficulty sequencing;Requires verbal cues;Requires tactile cues General Comments: pt anxious with postural changes and working on leaning anteriorly this session which is new; tangential thoughts and answering questions inappropriately to conversation; pt does not remember this therapist from previous sessions      Exercises      General Comments General comments (skin integrity, edema, etc.): at times bilat UE decerebrate posturing       Pertinent  Vitals/Pain Pain Assessment: No/denies pain Pain Location: pt with no mention of pin/needles sensation or tempreature sensitivity this session    Home Living                      Prior Function            PT Goals (current goals can now be found in the care plan  section) Progress towards PT goals: Progressing toward goals    Frequency    Min 3X/week      PT Plan Current plan remains appropriate    Co-evaluation              AM-PAC PT "6 Clicks" Mobility   Outcome Measure  Help needed turning from your back to your side while in a flat bed without using bedrails?: A Lot Help needed moving from lying on your back to sitting on the side of a flat bed without using bedrails?: A Lot Help needed moving to and from a bed to a chair (including a wheelchair)?: Total Help needed standing up from a chair using your arms (e.g., wheelchair or bedside chair)?: Total Help needed to walk in hospital room?: Total Help needed climbing 3-5 steps with a railing? : Total 6 Click Score: 8    End of Session   Activity Tolerance: Patient tolerated treatment well;Other (comment)(anxious) Patient left: with call bell/phone within reach;in bed;with bed alarm set Nurse Communication: Mobility status;Need for lift equipment PT Visit Diagnosis: Muscle weakness (generalized) (M62.81);Difficulty in walking, not elsewhere classified (R26.2)     Time: 1287-8676 PT Time Calculation (min) (ACUTE ONLY): 22 min  Charges:  $Therapeutic Activity: 8-22 mins                     Erline Levine, PTA Acute Rehabilitation Services Pager: [phone] Office: [phone]     Carolynne Edouard 08/21/2019, 2:21 PM

## 2019-08-21 NOTE — Plan of Care (Signed)
  Problem: Education: Goal: Knowledge of General Education information will improve Description: Including pain rating scale, medication(s)/side effects and non-pharmacologic comfort measures Outcome: Progressing   Problem: Clinical Measurements: Goal: Will remain free from infection Outcome: Progressing Goal: Diagnostic test results will improve Outcome: Progressing Goal: Respiratory complications will improve Outcome: Progressing Goal: Cardiovascular complication will be avoided Outcome: Progressing   Problem: Activity: Goal: Risk for activity intolerance will decrease Outcome: Progressing   Problem: Nutrition: Goal: Adequate nutrition will be maintained Outcome: Progressing   Problem: Coping: Goal: Level of anxiety will decrease Outcome: Progressing   Problem: Elimination: Goal: Will not experience complications related to bowel motility Outcome: Progressing Goal: Will not experience complications related to urinary retention Outcome: Progressing   Problem: Pain Managment: Goal: General experience of comfort will improve Outcome: Progressing   Problem: Safety: Goal: Ability to remain free from injury will improve Outcome: Progressing   Problem: Skin Integrity: Goal: Risk for impaired skin integrity will decrease Outcome: Progressing   

## 2019-08-21 NOTE — Progress Notes (Signed)
Pt noted to have had 4x I&O cath since foley d/c'd documented. [place] updated and V.O. to I&O cath this shift to restart the "x3 I&O cath w/in 24hrs, then place foley protocol".

## 2019-08-21 NOTE — Progress Notes (Signed)
PROGRESS NOTE    Denise Santiago  LYY:[id number] DOB: 01-13-1956 DOA: 07/26/2019 PCP: Renford Dills, MD    Brief Narrative:  Denise Hamiltonis a 64 y.o.femalewith medical history significant ofhypertension, diabetes mellitus, hyperlipidemia, CVA on 12/25/2018 and 03/27/2019(has loop recorder), tobacco abuse presents to emergency department due to generalized weakness.  Patient tells me that she received Covid vaccine last Saturday and since then she has been feeling weak, lethargic, has no energy and has decreased appetite. Yesterday she slid out of her chair onto the floor and was not able to get up due to generalized weakness. She stayed on the floor all night long. Her niece was with her at that time but she could not get her up as well. EMS was called this morning and brought here to the emergency department for further evaluation and management.  Patient denies headache, blurry vision, slurred speech, head trauma, loss of consciousness, seizures, chest pain, shortness of breath, palpitation, leg swelling, fever, chills, nausea, vomiting, diarrhea, cough, congestion, wheezing, dysuria or any other urinary symptoms.  She lives alone and uses walker and cane for ambulation. She continues to smoke 1 pack of cigarettes per day however denies alcohol, recent drug use.  As per the sister,patient has lost more than 50 pounds since November after her last stroke.On further questioning, pt reports that she has altered taste and no appetite whatso ever, along with loose teeth , preventing her to eat any solid food. Malignancy work up has been initiated and CT abd , pelvis and chest with contrast orderedOn 3/27forfurther evaluation. CT of the chest with contrast showed acute bilateral pulmonary embolism and CT of the abdomen showed ascending colon colitis.  Per sister patient started have strange behavior since Christmas, she startedtoeat less, But patient has been able to  perform ADLs, even driving to work on March 15.  The patient has been very drowsy and weak. She has a very poor PO intake. 48 hour calorie count over the weekend demonstrates that the patient oral intake remains low. However, she does drink ensure without difficulty. Per nutrition if she is able to take 4 cans of ensure daily, she will meed 80% of her nutritional needs. If she is not able to increase her nutritional intake, we will have to consider feeding tube. Psychiatry was consulted for her anorexia. They have started her on Remeron. This is expected to improve her appetite and address her depressive symptoms.  The patient has completed 5 days of IVIG, and has demonstrated improved strength. Continue PT/OT.  She received her first Covid vaccine on March 13,second dose scheduled on April 7.  Assessment & Plan:   Principal Problem:   Major depressive disorder Active Problems:   Essential hypertension   Type 2 diabetes mellitus without complication (HCC)   CVA (cerebral vascular accident) (HCC)   Hyperlipidemia   Generalized weakness   Hypokalemia   Hyponatremia   Acute on chronic kidney failure (HCC)   Thrombocytopenia (HCC)   Elevated troponin   Lactic acidosis   AKI (acute kidney injury) (HCC)   Palliative care by specialist   Goals of care, counseling/discussion   Xerostomia   Dry lips   Failure to thrive in adult   Physical debility   Malnutrition of moderate degree   Esophageal thrush (HCC)   Palliative care encounter   Pulmonary embolism (HCC)   DVT (deep venous thrombosis) (HCC)   Thrush, oral   Colitis  FTT/progressive weakness/poor oral intake with reported weight loss: Per sister patient started have  strange behavior since Christmas, she started eat less, But patient has been able to perform ADLs, even driving to work on March 15. She is now very weak not able to lift lower extremity against gravity,bilateral  arms are very weak as well. MRI of the brain was  repeated and no acute findings were found. Neurology (Dr. Cheral Marker) consulted to assess movement disorder, possible myopathy. MRI spine no acute findings, per neurology Dr. 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 Due to anticoagulation for PE treatment, not able to perform LP safely. For this reason Dr. Cheral Marker recommended empiric treatment with IVIG. This has now completed. Neurology has reassessed the patient today. They feel that she has improved and recommends PT/OT. I appreciate neurology's assistance.  Anti GQ1b antibody ordered. Pending. I appreciate neurology's assistance.  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 Nutrition has been consulted and calorie count has been initiated. She has been started on Remeron for appetite stimulant, continue nutrition supplement. Esophagram and orthopantogram ordered on March 27th, It does not appear to have done,presumably due to lethargy anddoes not take oral meds reliably. She has receiving IVIG treatment as per neurology. 48 hour calorie count over the weekend demonstrates that the patient oral intake remains low. However, she does drink ensure without difficulty. Per nutrition if she is able to take 4 cans of ensure daily, she will meed 80% of her nutritional needs. If she is not able to increase her nutritional intake, we will have to consider feeding tube. Psychiatry was consulted to address her anorexia. They have started the patient on remeron at hs. They expect that this will address her appetite and depressive symptoms.  Ascending colon colitis,incidental finding on CT scan during malignancy work-up: Patient denies any nausea or vomiting and abdominal pain. Blood culture have shown no growth. She has been treated with 10 days of antibiotics  including Rocephin,Zosyn thenoral Augmentin. She was also started on Diflucan on March 29. It was discontinued on April 4.  Bilateral pulmonary embolism(incidental findingon ct chestduring malignancy work-up) Patient was started on IV heparin. She has now been transitioned to oral Eliquis. Venous duplex of the lower extremities shows acute DVT involving the single left posterior tibial vein as well. Echocardiogram ordered showed left ventricular ejection fraction of 60 to 65% without any regional wall abnormalities. Left ventricular diastolic parameters are consistent with grade 1 diastolic dysfunction. No right heart strain seen.  Hematuria: Resolved. 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 Urine culture has been ordered. I will start the patient on IV Antibiotics to cover for klebsiella pneumonia and proteus species. Thus far repeat urine culture has had no growth.  Thrombocytopenia, acute on chronic: Platelet 88 on presentation 113 on 08/14/2019. Likely due to PE and DVT  Urinary retention,Foley inserted on April 3. Removed today. Voiding trial successful.  AKI superimposed on stage II CKD: Resolved  Creatinine 1.38 on presentation, 08/14/2019 is 0.76. Repeat BMP in the morning. Monitor renal function.  Hypokalemia: Resolved. supplement and monitor.remain low continue toreplace K.   History of recurrent CVA: CVA on 12/25/2018 and 03/27/2019(has loop recorder). MRI brain obtained March 25 demonstrated no acute CVA. Continue Plavix and statin. She is on Eliquis now due to PE.  Constipation; appear resolved.  Psychosis: Hallucinations resolved.  Body mass index is 33.95 kg/m.Obesity complicates all cares.   First Covid vaccine Therapist, music )on  March 13. Second dose due on 4/7. The patient will have to have second dose given when she leaves the hospital.   DVT Prophylaxis: On Eliquis Code Status:DNR Family Communication:We have discussed the patient with her  sister, Alethia Berthold. All questions answered to the best of my ability. They are considering hospice if the patient's oral intake does not pick-up. Disposition Plan:The patient came from home. Anticipate discharge to SNF vs [facility] PT with 24 /7/ supervision. Barriers to dc include poor oral intake, and difficult to place due to lack of insurance.    Consultants:   Neurology  Dentistry  Palliative care  Psychiatry  Cardiology  Procedures:  Antimicrobials:  Anti-infectives (From admission, onward)   Start     Dose/Rate Route Frequency Ordered Stop   08/13/19 2330  piperacillin-tazobactam (ZOSYN) IVPB 3.375 g  Status:  Discontinued     3.375 g 12.5 mL/hr over 240 Minutes Intravenous Every 8 hours 08/13/19 1610 08/15/19 1249   08/13/19 1530  piperacillin-tazobactam (ZOSYN) IVPB 3.375 g  Status:  Discontinued     3.375 g 12.5 mL/hr over 240 Minutes Intravenous Every 8 hours 08/13/19 1412 08/13/19 1610   08/04/19 1015  amoxicillin-clavulanate (AUGMENTIN) 250-62.5 MG/5ML suspension 500 mg     500 mg Oral Every 12 hours 08/04/19 0844 08/08/19 2139   08/03/19 2000  fluconazole (DIFLUCAN) IVPB 100 mg  Status:  Discontinued     100 mg 50 mL/hr over 60 Minutes Intravenous Every 24 hours 08/03/19 1802 08/09/19 1813   08/01/19 1715  piperacillin-tazobactam (ZOSYN) IVPB 3.375 g  Status:  Discontinued     3.375 g 12.5 mL/hr over 240 Minutes Intravenous Every 8 hours 08/01/19 1712 08/04/19 0844   07/30/19 0000  cefTRIAXone (ROCEPHIN) 1 g in sodium chloride 0.9 % 100 mL IVPB  Status:  Discontinued     1 g 200 mL/hr over 30 Minutes Intravenous Every 24 hours 07/29/19 1420 07/29/19 1421   07/29/19 1421  cefTRIAXone (ROCEPHIN) 1 g in sodium chloride 0.9 % 100 mL IVPB  Status:  Discontinued     1 g 200 mL/hr over 30 Minutes Intravenous Every 24 hours 07/29/19 1421 08/01/19 0716      Subjective: Patient was seen and examined at bedside.  No overnight events, she denies any  concerns.  Objective: Vitals:   08/21/19 0033 08/21/19 0434 08/21/19 0436 08/21/19 0824  BP: (!) 166/76 (!) 150/82  (!) 150/74  Pulse: (!) 108 (!) 108  100  Resp: 18 18  18   Temp: 99.6 F (37.6 C) 98.8 F (37.1 C)  98.6 F (37 C)  TempSrc: Oral Oral  Axillary  SpO2: 96% 97%  100%  Weight:   80.5 kg   Height:        Intake/Output Summary (Last 24 hours) at 08/21/2019 1225 Last data filed at 08/21/2019 0918 Gross per 24 hour  Intake 50 ml  Output --  Net 50 ml   Filed Weights   08/18/19 0448 08/19/19 0441 08/21/19 0436  Weight: 82.2 kg 80.9 kg 80.5 kg    Examination:  General exam: Appears calm and comfortable  Respiratory system: Clear to auscultation. Respiratory effort normal. Cardiovascular system: S1 & S2 heard, RRR. No JVD, murmurs, rubs, gallops or clicks. No pedal edema. Gastrointestinal system: Abdomen is nondistended, soft and nontender. No organomegaly or masses felt. Normal bowel sounds heard. Central nervous system: Alert and oriented. No focal neurological deficits. Extremities: Symmetric 5 x 5 power. Skin: No rashes, lesions or ulcers Psychiatry: Judgement and insight  appear normal. Mood & affect appropriate.     Data Reviewed: I have personally reviewed following labs and imaging studies  CBC: Recent Labs  Lab 08/17/19 0513 08/19/19 0035 08/20/19 0352 08/21/19 0001 08/21/19 0429  WBC 5.9 4.9 3.6* 5.4 4.8  NEUTROABS  --   --  3.0  --   --   HGB 10.8* 12.1 11.7* 11.4* 11.1*  HCT 33.2* 36.7 35.4* 34.5* 34.2*  MCV 92.2 92.2 91.2 92.0 92.4  PLT 145* 153 158 173 158   Basic Metabolic Panel: Recent Labs  Lab 08/17/19 0513 08/18/19 0339 08/19/19 0035 08/20/19 0352 08/21/19 0429  NA 141 137 137 140 142  K 3.2* 3.1* 3.4* 3.4* 3.9  CL 105 104 105 109 112*  CO2 27 24 22 23  21*  GLUCOSE 111* 141* 132* 120* 114*  BUN 13 12 10 9 9   CREATININE 0.96 0.77 0.75 0.75 0.85  CALCIUM 8.6* 8.5* 8.9 8.6* 8.9  MG  --   --  1.8  --   --     GFR: Estimated Creatinine Clearance: 65.8 mL/min (by C-G formula based on SCr of 0.85 mg/dL). Liver Function Tests: No results for input(s): AST, ALT, ALKPHOS, BILITOT, PROT, ALBUMIN in the last 168 hours. No results for input(s): LIPASE, AMYLASE in the last 168 hours. No results for input(s): AMMONIA in the last 168 hours. Coagulation Profile: No results for input(s): INR, PROTIME in the last 168 hours. Cardiac Enzymes: No results for input(s): CKTOTAL, CKMB, CKMBINDEX, TROPONINI in the last 168 hours. BNP (last 3 results) No results for input(s): PROBNP in the last 8760 hours. HbA1C: No results for input(s): HGBA1C in the last 72 hours. CBG: Recent Labs  Lab 08/20/19 0641 08/20/19 1156 08/20/19 1522 08/20/19 2118 08/21/19 0621  GLUCAP 113* 106* 87 97 141*   Lipid Profile: No results for input(s): CHOL, HDL, LDLCALC, TRIG, CHOLHDL, LDLDIRECT in the last 72 hours. Thyroid Function Tests: No results for input(s): TSH, T4TOTAL, FREET4, T3FREE, THYROIDAB in the last 72 hours. Anemia Panel: No results for input(s): VITAMINB12, FOLATE, FERRITIN, TIBC, IRON, RETICCTPCT in the last 72 hours. Sepsis Labs: No results for input(s): PROCALCITON, LATICACIDVEN in the last 168 hours.  Recent Results (from the past 240 hour(s))  Culture, Urine     Status: None   Collection Time: 08/13/19  2:06 PM   Specimen: Urine, Catheterized  Result Value Ref Range Status   Specimen Description URINE, CATHETERIZED  Final   Special Requests NONE  Final   Culture   Final    NO GROWTH Performed at [facility], [address] [place], [address] [place]    Report Status 08/14/2019 FINAL  Final     Radiology Studies: No results found.   Scheduled Meds: . antiseptic oral rinse  15 mL Mouth Rinse BID  . apixaban  5 mg Oral BID  . Chlorhexidine Gluconate Cloth  6 each Topical Daily  . clopidogrel  75 mg Oral Daily  . dronabinol  2.5 mg Oral BID AC  . feeding supplement (ENSURE ENLIVE)   237 mL Oral QID  . hydrALAZINE  25 mg Oral Q8H  . insulin aspart  0-15 Units Subcutaneous TID WC  . insulin aspart  0-5 Units Subcutaneous QHS  . latanoprost  1 drop Both Eyes QHS  . lisinopril  20 mg Oral Daily  . mirtazapine  15 mg Oral QHS  . nicotine  21 mg Transdermal Daily  . nystatin  5 mL Oral QID  . potassium  chloride  20 mEq Oral Daily  . sodium chloride flush  3 mL Intravenous Once   Continuous Infusions:   LOS: 25 days    Time spent: 25 mins    Cipriano BunkerPARDEEP Izan Miron, MD [facility]   If 7PM-7AM, please contact night-coverage

## 2019-08-21 NOTE — Progress Notes (Signed)
Pt actually voided, no need to I&O cath.

## 2019-08-22 LAB — BASIC METABOLIC PANEL
Anion gap: 8 (ref 5–15)
BUN: 10 mg/dL (ref 8–23)
CO2: 23 mmol/L (ref 22–32)
Calcium: 9 mg/dL (ref 8.9–10.3)
Chloride: 110 mmol/L (ref 98–111)
Creatinine, Ser: 0.82 mg/dL (ref 0.44–1.00)
GFR calc Af Amer: >60 mL/min (ref >60)
GFR calc non Af Amer: >60 mL/min (ref >60)
Glucose, Bld: 125 mg/dL — ABNORMAL HIGH (ref 70–99)
Potassium: 3.8 mmol/L (ref 3.5–5.1)
Sodium: 141 mmol/L (ref 135–145)

## 2019-08-22 LAB — GLUCOSE, CAPILLARY
Glucose-Capillary: 117 mg/dL — ABNORMAL HIGH (ref 70–99)
Glucose-Capillary: 104 mg/dL — ABNORMAL HIGH (ref 70–99)
Glucose-Capillary: 103 mg/dL — ABNORMAL HIGH (ref 70–99)
Glucose-Capillary: 92 mg/dL (ref 70–99)

## 2019-08-22 LAB — MAGNESIUM: Magnesium: 1.9 mg/dL (ref 1.7–2.4)

## 2019-08-22 NOTE — Progress Notes (Signed)
PROGRESS NOTE    Denise Santiago  NLZ:[id number] DOB: 08/09/1955 DOA: 07/26/2019 PCP: Seward Carol, MD    Brief Narrative:  Denise Hamiltonis a 64 y.o.femalewith medical history significant ofhypertension, diabetes mellitus, hyperlipidemia, CVA on 12/25/2018 and 03/27/2019(has loop recorder), tobacco abuse presents to emergency department due to generalized weakness.  Patient tells me that she received Covid vaccine last Saturday and since then she has been feeling weak, lethargic, has no energy and has decreased appetite. Yesterday she slid out of her chair onto the floor and was not able to get up due to generalized weakness. She stayed on the floor all night long. Her niece was with her at that time but she could not get her up as well. EMS was called this morning and brought here to the emergency department for further evaluation and management.  Patient denies headache, blurry vision, slurred speech, head trauma, loss of consciousness, seizures, chest pain, shortness of breath, palpitation, leg swelling, fever, chills, nausea, vomiting, diarrhea, cough, congestion, wheezing, dysuria or any other urinary symptoms.  She lives alone and uses walker and cane for ambulation. She continues to smoke 1 pack of cigarettes per day however denies alcohol, recent drug use.  As per the sister,patient has lost more than 50 pounds since November after her last stroke.On further questioning, pt reports that she has altered taste and no appetite whatso ever, along with loose teeth , preventing her to eat any solid food. Malignancy work up has been initiated and CT abd , pelvis and chest with contrast orderedOn 3/13forfurther evaluation. CT of the chest with contrast showed acute bilateral pulmonary embolism and CT of the abdomen showed ascending colon colitis.  Per sister patient started have strange behavior since Christmas, she startedtoeat less, But patient has been able to  perform ADLs, even driving to work on March 15.  The patient has been very drowsy and weak. She has a very poor PO intake. 48 hour calorie count over the weekend demonstrates that the patient oral intake remains low. However, she does drink ensure without difficulty. Per nutrition if she is able to take 4 cans of ensure daily, she will meed 80% of her nutritional needs. If she is not able to increase her nutritional intake, we will have to consider feeding tube. Psychiatry was consulted for her anorexia. They have started her on Remeron. This is expected to improve her appetite and address her depressive symptoms.  The patient has completed 5 days of IVIG, and has demonstrated improved strength. Continue PT/OT.  She received her first Covid vaccine on March 13,second dose scheduled on April 7.  Assessment & Plan:   Principal Problem:   Major depressive disorder Active Problems:   Essential hypertension   Type 2 diabetes mellitus without complication (HCC)   CVA (cerebral vascular accident) ([place])   Hyperlipidemia   Generalized weakness   Hypokalemia   Hyponatremia   Acute on chronic kidney failure (HCC)   Thrombocytopenia (HCC)   Elevated troponin   Lactic acidosis   AKI (acute kidney injury) ([place])   Palliative care by specialist   Goals of care, counseling/discussion   Xerostomia   Dry lips   Failure to thrive in adult   Physical debility   Malnutrition of moderate degree   Esophageal thrush (HCC)   Palliative care encounter   Pulmonary embolism ([place])   DVT (deep venous thrombosis) ([place])   Thrush, oral   Colitis  FTT/progressive weakness/poor oral intake with reported weight loss: Per sister patient started having  strange behavior since Christmas, she started eat less, But patient has been able to perform ADLs, even driving to work on March 15. She is now very weak not able to lift lower extremity against gravity, bilateral  arms are very weak as well. MRI of the brain was  repeated and no acute findings were found. Neurology (Dr. Otelia Limes) consulted to assess movement disorder, possible myopathy. MRI spine no acute findings, per neurology Dr. 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 Due to anticoagulation for PE treatment, not able to perform LP safely. For this reason Dr. Otelia Limes recommended empiric treatment with IVIG. This has now completed. Neurology has reassessed the patient today. They feel that she has improved and recommends PT/OT. I appreciate neurology's assistance.  Anti GQ1b antibody ordered. Pending. I appreciate neurology's assistance.  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 Nutrition has been consulted and calorie count has been initiated. She has been started on Remeron for appetite stimulant, continue nutrition supplement. Esophagram and orthopantogram ordered on March 27th, It does not appear to have done,presumably due to lethargy anddoes not take oral meds reliably. She has receiving IVIG treatment as per neurology. 48 hour calorie count over the weekend demonstrates that the patient oral intake remains low. However, she does drink ensure without difficulty. Per nutrition if she is able to take 4 cans of ensure daily, she will meed 80% of her nutritional needs. If she is not able to increase her nutritional intake, we will have to consider feeding tube. Psychiatry was consulted to address her anorexia. They have started the patient on remeron at hs. They expect that this will address her appetite and depressive symptoms.  Ascending colon colitis, Resolved incidental finding on CT scan during malignancy work-up: Patient denies any nausea or vomiting and abdominal pain. Blood culture have shown no growth. She has been treated with 10 days of  antibiotics including Rocephin,Zosyn thenoral Augmentin. She was also started on Diflucan on March 29. It was discontinued on April 4.  Bilateral pulmonary embolism(incidental findingon ct chestduring malignancy work-up) Patient was started on IV heparin. She has now been transitioned to oral Eliquis. Venous duplex of the lower extremities shows acute DVT involving the single left posterior tibial vein as well. Echocardiogram ordered showed left ventricular ejection fraction of 60 to 65% without any regional wall abnormalities. Left ventricular diastolic parameters are consistent with grade 1 diastolic dysfunction. No right heart strain seen.  Hematuria: Resolved. 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 Urine culture has been ordered. I will start the patient on IV Antibiotics to cover for klebsiella pneumonia and proteus species. Thus far repeat urine culture has had no growth.  Thrombocytopenia, acute on chronic: Platelet 88 on presentation 113 on 08/14/2019. Likely due to PE and DVT  Urinary retention- >>> Resolved.,Foley inserted on April 3. Removed today. Voiding trial successful.  AKI superimposed on stage II CKD: Resolved  Creatinine 1.38 on presentation, 08/14/2019 is 0.76. Repeat BMP in the morning. Monitor renal function.  Hypokalemia: Resolved. supplement and monitor.remain low continue toreplace K.   History of recurrent CVA: CVA on 12/25/2018 and 03/27/2019(has loop recorder). MRI brain obtained March 25 demonstrated no acute CVA. Continue Plavix and statin. She is on Eliquis now due to PE.  Constipation; appear resolved.  Psychosis: Hallucinations resolved.  Body mass index is 33.95 kg/m.Obesity complicates all cares.  First Covid vaccine [employer] )on March 13. Second dose due on 4/7. The patient will have to have second dose given when she leaves the hospital.   DVT Prophylaxis: On Eliquis Code Status:DNR Family Communication:We have  discussed the patient with her sister, Alethia Berthold. All questions answered to the best of my ability. They are considering hospice if the patient's oral intake does not pick-up. Disposition Plan:The patient came from home. Anticipate discharge to SNF vs [facility] PT with 24 /7/ supervision. Barriers to dc include poor oral intake, and difficult to place due to lack of insurance.    Consultants:   Neurology  Dentistry  Palliative care  Psychiatry  Cardiology  Procedures:  Antimicrobials:  Anti-infectives (From admission, onward)   Start     Dose/Rate Route Frequency Ordered Stop   08/13/19 2330  piperacillin-tazobactam (ZOSYN) IVPB 3.375 g  Status:  Discontinued     3.375 g 12.5 mL/hr over 240 Minutes Intravenous Every 8 hours 08/13/19 1610 08/15/19 1249   08/13/19 1530  piperacillin-tazobactam (ZOSYN) IVPB 3.375 g  Status:  Discontinued     3.375 g 12.5 mL/hr over 240 Minutes Intravenous Every 8 hours 08/13/19 1412 08/13/19 1610   08/04/19 1015  amoxicillin-clavulanate (AUGMENTIN) 250-62.5 MG/5ML suspension 500 mg     500 mg Oral Every 12 hours 08/04/19 0844 08/08/19 2139   08/03/19 2000  fluconazole (DIFLUCAN) IVPB 100 mg  Status:  Discontinued     100 mg 50 mL/hr over 60 Minutes Intravenous Every 24 hours 08/03/19 1802 08/09/19 1813   08/01/19 1715  piperacillin-tazobactam (ZOSYN) IVPB 3.375 g  Status:  Discontinued     3.375 g 12.5 mL/hr over 240 Minutes Intravenous Every 8 hours 08/01/19 1712 08/04/19 0844   07/30/19 0000  cefTRIAXone (ROCEPHIN) 1 g in sodium chloride 0.9 % 100 mL IVPB  Status:  Discontinued     1 g 200 mL/hr over 30 Minutes Intravenous Every 24 hours 07/29/19 1420 07/29/19 1421   07/29/19 1421  cefTRIAXone (ROCEPHIN) 1 g in sodium chloride 0.9 % 100 mL IVPB  Status:  Discontinued     1 g 200 mL/hr over 30 Minutes Intravenous Every 24 hours 07/29/19 1421 08/01/19 0716      Subjective: Patient was seen and examined at bedside.  No overnight events, she  was sleeping when examined.  Objective: Vitals:   08/22/19 0342 08/22/19 0353 08/22/19 0743 08/22/19 1129  BP: (!) 171/103 (!) 173/93 (!) 175/93 (!) 159/95  Pulse: 96  95 93  Resp: 20  18 18   Temp: 98.6 F (37 C)  98 F (36.7 C) 97.7 F (36.5 C)  TempSrc: Oral  Oral Axillary  SpO2: 98%  98% 97%  Weight:      Height:        Intake/Output Summary (Last 24 hours) at 08/22/2019 1202 Last data filed at 08/21/2019 1841 Gross per 24 hour  Intake --  Output 200 ml  Net -200 ml   Filed Weights   08/18/19 0448 08/19/19 0441 08/21/19 0436  Weight: 82.2 kg 80.9 kg 80.5 kg    Examination:  General exam: Appears calm and comfortable  Respiratory system: Clear to auscultation. Respiratory effort normal. Cardiovascular system: S1 & S2 heard, RRR. No JVD, murmurs, rubs, gallops or clicks. No pedal edema. Gastrointestinal system: Abdomen is nondistended, soft and nontender. No organomegaly or masses felt. Normal bowel sounds heard. Central nervous system: Alert and oriented. No focal neurological deficits. Extremities: Symmetric 5 x 5 power. Skin: No rashes, lesions or ulcers  Psychiatry: Judgement and insight appear normal. Mood & affect appropriate.     Data Reviewed: I have personally reviewed following labs and imaging studies  CBC: Recent Labs  Lab 08/17/19 0513 08/19/19 0035 08/20/19 0352 08/21/19 0001 08/21/19 0429  WBC 5.9 4.9 3.6* 5.4 4.8  NEUTROABS  --   --  3.0  --   --   HGB 10.8* 12.1 11.7* 11.4* 11.1*  HCT 33.2* 36.7 35.4* 34.5* 34.2*  MCV 92.2 92.2 91.2 92.0 92.4  PLT 145* 153 158 173 158   Basic Metabolic Panel: Recent Labs  Lab 08/18/19 0339 08/19/19 0035 08/20/19 0352 08/21/19 0429 08/22/19 0516  NA 137 137 140 142 141  K 3.1* 3.4* 3.4* 3.9 3.8  CL 104 105 109 112* 110  CO2 24 22 23  21* 23  GLUCOSE 141* 132* 120* 114* 125*  BUN 12 10 9 9 10   CREATININE 0.77 0.75 0.75 0.85 0.82  CALCIUM 8.5* 8.9 8.6* 8.9 9.0  MG  --  1.8  --   --  1.9    GFR: Estimated Creatinine Clearance: 68.2 mL/min (by C-G formula based on SCr of 0.82 mg/dL). Liver Function Tests: No results for input(s): AST, ALT, ALKPHOS, BILITOT, PROT, ALBUMIN in the last 168 hours. No results for input(s): LIPASE, AMYLASE in the last 168 hours. No results for input(s): AMMONIA in the last 168 hours. Coagulation Profile: No results for input(s): INR, PROTIME in the last 168 hours. Cardiac Enzymes: No results for input(s): CKTOTAL, CKMB, CKMBINDEX, TROPONINI in the last 168 hours. BNP (last 3 results) No results for input(s): PROBNP in the last 8760 hours. HbA1C: No results for input(s): HGBA1C in the last 72 hours. CBG: Recent Labs  Lab 08/21/19 1241 08/21/19 1559 08/21/19 2108 08/22/19 0620 08/22/19 1127  GLUCAP 88 98 96 117* 104*   Lipid Profile: No results for input(s): CHOL, HDL, LDLCALC, TRIG, CHOLHDL, LDLDIRECT in the last 72 hours. Thyroid Function Tests: No results for input(s): TSH, T4TOTAL, FREET4, T3FREE, THYROIDAB in the last 72 hours. Anemia Panel: No results for input(s): VITAMINB12, FOLATE, FERRITIN, TIBC, IRON, RETICCTPCT in the last 72 hours. Sepsis Labs: No results for input(s): PROCALCITON, LATICACIDVEN in the last 168 hours.  Recent Results (from the past 240 hour(s))  Culture, Urine     Status: None   Collection Time: 08/13/19  2:06 PM   Specimen: Urine, Catheterized  Result Value Ref Range Status   Specimen Description URINE, CATHETERIZED  Final   Special Requests NONE  Final   Culture   Final    NO GROWTH Performed at [facility], [address], [place], [address] [place]    Report Status 08/14/2019 FINAL  Final     Radiology Studies: No results found.   Scheduled Meds: . antiseptic oral rinse  15 mL Mouth Rinse BID  . apixaban  5 mg Oral BID  . Chlorhexidine Gluconate Cloth  6 each Topical Daily  . dronabinol  2.5 mg Oral BID AC  . feeding supplement (ENSURE ENLIVE)  237 mL Oral QID  . hydrALAZINE  25  mg Oral Q8H  . insulin aspart  0-15 Units Subcutaneous TID WC  . insulin aspart  0-5 Units Subcutaneous QHS  . latanoprost  1 drop Both Eyes QHS  . lisinopril  20 mg Oral Daily  . mirtazapine  15 mg Oral QHS  . nicotine  21 mg Transdermal Daily  . nystatin  5 mL Oral QID  . potassium chloride  20 mEq Oral Daily  .  sodium chloride flush  3 mL Intravenous Once   Continuous Infusions:   LOS: 26 days    Time spent: 25 mins  Keirsten Matuska, MD [facility]   If 7PM-7AM, please contact night-coverage

## 2019-08-23 LAB — GLUCOSE, CAPILLARY
Glucose-Capillary: 113 mg/dL — ABNORMAL HIGH (ref 70–99)
Glucose-Capillary: 121 mg/dL — ABNORMAL HIGH (ref 70–99)
Glucose-Capillary: 70 mg/dL (ref 70–99)
Glucose-Capillary: 96 mg/dL (ref 70–99)

## 2019-08-23 NOTE — Progress Notes (Signed)
PROGRESS NOTE    Denise Santiago  INO:[id number] DOB: 04/06/1956 DOA: 07/26/2019 PCP: Renford Dills, MD    Brief Narrative:  Denise Hamiltonis a 64 y.o.femalewith medical history significant ofhypertension, diabetes mellitus, hyperlipidemia, CVA on 12/25/2018 and 03/27/2019(has loop recorder), tobacco abuse presents to emergency department due to generalized weakness. Patient tells me that she received Covid vaccine last Saturday and since then she has been feeling weak, lethargic, has no energy and has decreased appetite. Yesterday she slid out of her chair onto the floor and was not able to get up due to generalized weakness. She stayed on the floor all night long. Her niece was with her at that time but she could not get her up as well. EMS was called this morning and brought here to the emergency department for further evaluation and management.  She lives alone and uses walker and cane for ambulation. She continues to smoke 1 pack of cigarettes per day however denies alcohol, recent drug use.  As per the sister,patient has lost more than 50 pounds since November after her last stroke.On further questioning, pt reports that she has altered taste and no appetite whatso ever, along with loose teeth , preventing her to eat any solid food. Malignancy work up has been initiated and CT abd , pelvis and chest with contrast orderedOn 3/27forfurther evaluation. CT of the chest with contrast showed acute bilateral pulmonary embolism and CT of the abdomen showed ascending colon colitis.  Per sister patient started have strange behavior since Christmas, she startedtoeat less, But patient has been able to perform ADLs, even driving to work on March 15.  The patient has been very drowsy and weak. She has a very poor PO intake. 48 hour calorie count over the weekend demonstrates that the patient oral intake remains low. However, she does drink ensure without difficulty. Per nutrition if  she is able to take 4 cans of ensure daily, she will meed 80% of her nutritional needs. If she is not able to increase her nutritional intake, we will have to consider feeding tube. Psychiatry was consulted for her anorexia. They have started her on Remeron. This is expected to improve her appetite and address her depressive symptoms.  The patient has completed 5 days of IVIG, and has demonstrated improved strength. Continue PT/OT.  She received her first Covid vaccine on March 13,second dose scheduled on April 7.  Assessment & Plan:   Principal Problem:   Major depressive disorder Active Problems:   Essential hypertension   Type 2 diabetes mellitus without complication (HCC)   CVA (cerebral vascular accident) (HCC)   Hyperlipidemia   Generalized weakness   Hypokalemia   Hyponatremia   Acute on chronic kidney failure (HCC)   Thrombocytopenia (HCC)   Elevated troponin   Lactic acidosis   AKI (acute kidney injury) (HCC)   Palliative care by specialist   Goals of care, counseling/discussion   Xerostomia   Dry lips   Failure to thrive in adult   Physical debility   Malnutrition of moderate degree   Esophageal thrush (HCC)   Palliative care encounter   Pulmonary embolism (HCC)   DVT (deep venous thrombosis) (HCC)   Thrush, oral   Colitis  FTT/progressive weakness/poor oral intake with reported weight loss: Per sister patient started having strange behavior since Christmas, she started eat less, But patient has been able to perform ADLs, even driving to work on March 15. She is now very weak not able to lift lower extremity against gravity, bilateral  arms are very weak as well. MRI of the brain was repeated and no acute findings were found. Neurology (Dr. Cheral Marker) consulted to assess movement disorder, possible myopathy. MRI spine no acute findings, per neurology Dr. 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 Due to anticoagulation for PE treatment,  not able to perform LP safely. For this reason Dr. Cheral Marker recommended empiric treatment with IVIG. This has now completed. Neurology has reassessed the patient today. They feel that she has improved and recommends PT/OT. I appreciate neurology's assistance.  Anti GQ1b antibody ordered. Pending. I appreciate neurology's assistance.  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 Nutrition has been consulted and calorie count has been initiated. She has been started on Remeron for appetite stimulant, continue nutrition supplement. Esophagogram and orthopantogram ordered on March 27th, It does not appear to have done,presumably due to lethargy anddoes not take oral meds reliably. She has receiving IVIG treatment as per neurology. 48 hour calorie count over the weekend demonstrates that the patient oral intake remains low. However, she does drink ensure without difficulty. Per nutrition if she is able to take 4 cans of ensure daily, she will need 80% of her nutritional needs. If she is not able to increase her nutritional intake, we will have to consider feeding tube. Psychiatry was consulted to address her anorexia. They have started the patient on remeron at hs. They expect that this will address her appetite and depressive symptoms.  Ascending colon colitis ->>> Resolved incidental finding on CT scan during malignancy work-up: Patient denies any nausea or vomiting and abdominal pain. Blood culture have shown no growth. She has been treated with 10 days of antibiotics including Rocephin,Zosyn thenoral Augmentin. She was also started on Diflucan on March 29. It was discontinued on April 4.  Bilateral pulmonary embolism(incidental findingon ct chestduring malignancy work-up) Patient was started on IV heparin. She has now been  transitioned to oral Eliquis. Venous duplex of the lower extremities shows acute DVT involving the single left posterior tibial vein as well. Echocardiogram ordered showed left ventricular ejection fraction of 60 to 65% without any regional wall abnormalities. Left ventricular diastolic parameters are consistent with grade 1 diastolic dysfunction. No right heart strain seen.  Hematuria: Resolved. 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 Urine culture has been ordered. I will start the patient on IV Antibiotics to cover for klebsiella pneumonia and proteus species. Thus far repeat urine culture has had no growth.  Thrombocytopenia, acute on chronic: Platelet 88 on presentation 113 on 08/14/2019. Likely due to PE and DVT  Urinary retention- >>> Resolved.,Foley inserted on April 3. Removed today. Voiding trial successful.  AKI superimposed on stage II CKD: ->>> Resolved  Creatinine 1.38 on presentation, 08/14/2019 is 0.76. Repeat BMP in the morning. Monitor renal function.  Hypokalemia: Resolved. supplement and monitor.remain low continue toreplace K.   History of recurrent CVA: CVA on 12/25/2018 and 03/27/2019(has loop recorder). MRI brain obtained March 25 demonstrated no acute CVA. Continue Plavix and statin. She is on Eliquis now due to PE.  Constipation; appear resolved.  Psychosis: Hallucinations resolved.  Body mass index is 33.95 kg/m.Obesity complicates all cares.   First Covid vaccine [employer] )on March 13. Second dose due on 4/7. The patient will have to have second dose given when she leaves the hospital.   DVT Prophylaxis: On Eliquis Code Status:DNR Family Communication:We  have discussed the patient with her sister, Alethia Berthold. All questions answered to the best of my ability. They are considering hospice if the patient's oral intake does not pick-up. Disposition Plan:The patient came from home. Anticipate discharge to SNF vs [facility] PT with 24 /7/  supervision.  Barriers to dc include: Poor oral intake, and difficult to place due to lack of insurance.    Consultants:   Neurology  Dentistry  Palliative care  Psychiatry  Cardiology  Procedures:  Antimicrobials:  Anti-infectives (From admission, onward)   Start     Dose/Rate Route Frequency Ordered Stop   08/13/19 2330  piperacillin-tazobactam (ZOSYN) IVPB 3.375 g  Status:  Discontinued     3.375 g 12.5 mL/hr over 240 Minutes Intravenous Every 8 hours 08/13/19 1610 08/15/19 1249   08/13/19 1530  piperacillin-tazobactam (ZOSYN) IVPB 3.375 g  Status:  Discontinued     3.375 g 12.5 mL/hr over 240 Minutes Intravenous Every 8 hours 08/13/19 1412 08/13/19 1610   08/04/19 1015  amoxicillin-clavulanate (AUGMENTIN) 250-62.5 MG/5ML suspension 500 mg     500 mg Oral Every 12 hours 08/04/19 0844 08/08/19 2139   08/03/19 2000  fluconazole (DIFLUCAN) IVPB 100 mg  Status:  Discontinued     100 mg 50 mL/hr over 60 Minutes Intravenous Every 24 hours 08/03/19 1802 08/09/19 1813   08/01/19 1715  piperacillin-tazobactam (ZOSYN) IVPB 3.375 g  Status:  Discontinued     3.375 g 12.5 mL/hr over 240 Minutes Intravenous Every 8 hours 08/01/19 1712 08/04/19 0844   07/30/19 0000  cefTRIAXone (ROCEPHIN) 1 g in sodium chloride 0.9 % 100 mL IVPB  Status:  Discontinued     1 g 200 mL/hr over 30 Minutes Intravenous Every 24 hours 07/29/19 1420 07/29/19 1421   07/29/19 1421  cefTRIAXone (ROCEPHIN) 1 g in sodium chloride 0.9 % 100 mL IVPB  Status:  Discontinued     1 g 200 mL/hr over 30 Minutes Intravenous Every 24 hours 07/29/19 1421 08/01/19 0716      Subjective: Patient was seen and examined at bedside.  No overnight events,  She was Alert, oriented,  reports feeling better.  Objective: Vitals:   08/23/19 0346 08/23/19 0817 08/23/19 0818 08/23/19 1127  BP: (!) 144/117 (!) 150/87 (!) 150/87 (!) 174/89  Pulse: (!) 102 97  91  Resp: 16 18 18 18   Temp: 98.4 F (36.9 C) 98 F (36.7 C) 97.6 F  (36.4 C) 98 F (36.7 C)  TempSrc: Oral     SpO2: 98% 100% 98% 100%  Weight:      Height:        Intake/Output Summary (Last 24 hours) at 08/23/2019 1314 Last data filed at 08/23/2019 0853 Gross per 24 hour  Intake 70 ml  Output 425 ml  Net -355 ml   Filed Weights   08/18/19 0448 08/19/19 0441 08/21/19 0436  Weight: 82.2 kg 80.9 kg 80.5 kg    Examination:  General exam: Appears calm and comfortable  Respiratory system: Clear to auscultation. Respiratory effort normal. Cardiovascular system: S1 & S2 heard, RRR. No JVD, murmurs, rubs, gallops or clicks. No pedal edema. Gastrointestinal system: Abdomen is nondistended, soft and nontender. No organomegaly or masses felt. Normal bowel sounds heard. Central nervous system: Alert and oriented. No focal neurological deficits. Extremities: Symmetric 5 x 5 power. Skin: No rashes, lesions or ulcers Psychiatry: Judgement and insight appear normal. Mood & affect appropriate.     Data Reviewed: I have personally reviewed following labs and imaging studies  CBC: Recent Labs  Lab 08/17/19 0513 08/19/19 0035 08/20/19 0352 08/21/19 0001 08/21/19 0429  WBC 5.9 4.9 3.6* 5.4 4.8  NEUTROABS  --   --  3.0  --   --   HGB 10.8* 12.1 11.7* 11.4* 11.1*  HCT 33.2* 36.7 35.4* 34.5* 34.2*  MCV 92.2 92.2 91.2 92.0 92.4  PLT 145* 153 158 173 158   Basic Metabolic Panel: Recent Labs  Lab 08/18/19 0339 08/19/19 0035 08/20/19 0352 08/21/19 0429 08/22/19 0516  NA 137 137 140 142 141  K 3.1* 3.4* 3.4* 3.9 3.8  CL 104 105 109 112* 110  CO2 24 22 23  21* 23  GLUCOSE 141* 132* 120* 114* 125*  BUN 12 10 9 9 10   CREATININE 0.77 0.75 0.75 0.85 0.82  CALCIUM 8.5* 8.9 8.6* 8.9 9.0  MG  --  1.8  --   --  1.9   GFR: Estimated Creatinine Clearance: 68.2 mL/min (by C-G formula based on SCr of 0.82 mg/dL). Liver Function Tests: No results for input(s): AST, ALT, ALKPHOS, BILITOT, PROT, ALBUMIN in the last 168 hours. No results for input(s):  LIPASE, AMYLASE in the last 168 hours. No results for input(s): AMMONIA in the last 168 hours. Coagulation Profile: No results for input(s): INR, PROTIME in the last 168 hours. Cardiac Enzymes: No results for input(s): CKTOTAL, CKMB, CKMBINDEX, TROPONINI in the last 168 hours. BNP (last 3 results) No results for input(s): PROBNP in the last 8760 hours. HbA1C: No results for input(s): HGBA1C in the last 72 hours. CBG: Recent Labs  Lab 08/22/19 1127 08/22/19 1644 08/22/19 2129 08/23/19 0612 08/23/19 1125  GLUCAP 104* 103* 92 113* 121*   Lipid Profile: No results for input(s): CHOL, HDL, LDLCALC, TRIG, CHOLHDL, LDLDIRECT in the last 72 hours. Thyroid Function Tests: No results for input(s): TSH, T4TOTAL, FREET4, T3FREE, THYROIDAB in the last 72 hours. Anemia Panel: No results for input(s): VITAMINB12, FOLATE, FERRITIN, TIBC, IRON, RETICCTPCT in the last 72 hours. Sepsis Labs: No results for input(s): PROCALCITON, LATICACIDVEN in the last 168 hours.  Recent Results (from the past 240 hour(s))  Culture, Urine     Status: None   Collection Time: 08/13/19  2:06 PM   Specimen: Urine, Catheterized  Result Value Ref Range Status   Specimen Description URINE, CATHETERIZED  Final   Special Requests NONE  Final   Culture   Final    NO GROWTH Performed at [facility], [address], [place], [address] [place]    Report Status 08/14/2019 FINAL  Final     Radiology Studies: No results found.   Scheduled Meds: . antiseptic oral rinse  15 mL Mouth Rinse BID  . apixaban  5 mg Oral BID  . Chlorhexidine Gluconate Cloth  6 each Topical Daily  . dronabinol  2.5 mg Oral BID AC  . feeding supplement (ENSURE ENLIVE)  237 mL Oral QID  . hydrALAZINE  25 mg Oral Q8H  . insulin aspart  0-15 Units Subcutaneous TID WC  . insulin aspart  0-5 Units Subcutaneous QHS  . latanoprost  1 drop Both Eyes QHS  . lisinopril  20 mg Oral Daily  . mirtazapine  15 mg Oral QHS  . nicotine  21 mg  Transdermal Daily  . nystatin  5 mL Oral QID  . potassium chloride  20 mEq Oral Daily  . sodium chloride flush  3 mL Intravenous Once   Continuous Infusions:   LOS: 27 days    Time spent: 25 mins  Shawna Clamp, MD [facility]   If 7PM-7AM, please contact night-coverage

## 2019-08-24 LAB — CBC
HCT: 37.3 % (ref 36.0–46.0)
Hemoglobin: 12.1 g/dL (ref 12.0–15.0)
MCH: 30.3 pg (ref 26.0–34.0)
MCHC: 32.4 g/dL (ref 30.0–36.0)
MCV: 93.3 fL (ref 80.0–100.0)
Platelets: 176 10*3/uL (ref 150–400)
RBC: 4 MIL/uL (ref 3.87–5.11)
RDW: 16.8 % — ABNORMAL HIGH (ref 11.5–15.5)
WBC: 5.1 10*3/uL (ref 4.0–10.5)
nRBC: 0 % (ref 0.0–0.2)

## 2019-08-24 LAB — PHOSPHORUS: Phosphorus: 3.1 mg/dL (ref 2.5–4.6)

## 2019-08-24 LAB — COMPREHENSIVE METABOLIC PANEL
ALT: 23 U/L (ref 0–44)
AST: 29 U/L (ref 15–41)
Albumin: 2.7 g/dL — ABNORMAL LOW (ref 3.5–5.0)
Alkaline Phosphatase: 58 U/L (ref 38–126)
Anion gap: 10 (ref 5–15)
BUN: 11 mg/dL (ref 8–23)
CO2: 23 mmol/L (ref 22–32)
Calcium: 9.2 mg/dL (ref 8.9–10.3)
Chloride: 108 mmol/L (ref 98–111)
Creatinine, Ser: 0.9 mg/dL (ref 0.44–1.00)
GFR calc Af Amer: >60 mL/min (ref >60)
GFR calc non Af Amer: >60 mL/min (ref >60)
Glucose, Bld: 129 mg/dL — ABNORMAL HIGH (ref 70–99)
Potassium: 4.1 mmol/L (ref 3.5–5.1)
Sodium: 141 mmol/L (ref 135–145)
Total Bilirubin: 0.7 mg/dL (ref 0.3–1.2)
Total Protein: 7.2 g/dL (ref 6.5–8.1)

## 2019-08-24 LAB — GLUCOSE, CAPILLARY
Glucose-Capillary: 122 mg/dL — ABNORMAL HIGH (ref 70–99)
Glucose-Capillary: 99 mg/dL (ref 70–99)
Glucose-Capillary: 114 mg/dL — ABNORMAL HIGH (ref 70–99)
Glucose-Capillary: 103 mg/dL — ABNORMAL HIGH (ref 70–99)

## 2019-08-24 LAB — MAGNESIUM: Magnesium: 1.9 mg/dL (ref 1.7–2.4)

## 2019-08-24 NOTE — Progress Notes (Signed)
PROGRESS NOTE    Denise Santiago  GEZ:[id number] DOB: 12/02/1955 DOA: 07/26/2019 PCP: Renford Dills, MD   Brief Narrative: 64 year old female with history of hypertension, diabetes, hyperlipidemia, CVA on 12/25/2018 and 03/27/2019-has a loop recorder, history of tobacco abuse presented to ED with generalized weakness pm 07/26/19.  Patient reported taking Covid vaccine on Saturday, has been feeling weak lethargic, slid out of her chair onto the floor and was not able to get up 1 day PTA.  EMS was called and brought to the ED as patient not able to get up from the floor.  Recently lost 50 pound weight since November since her last stroke, normally lives alone uses walker at home. Patient was found to have acute bilateral pulmonary embolism and ascending colon colitis.  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  Subjective:  Alert,awake, moving LE able to draw up leg both. Knows year as 2021, but not the month, oriented to place, bed/room no, and herself.  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 Due to anticoagulation for PE treatment, not able to perform LP safely, patient empirically completed 5 days of IVIG. Cont PT OT supportive care. AntiGQ1b antibody pending  Failure to thrive with progressive weakness decreased oral intake/weight loss: Multifactorial, continue supportive care dietitianPT OT following.  Off statin.  Nutrition following for calorie count, placed on Remeron to stimulate appetite.  48 hour calorie count over the weekend  demonstrates that the patient oral intake remains low. However, she does drink ensure without difficulty. Per nutrition if she is able to take 4 cans of ensure daily, she will need 80% of her nutritional needs. If she is not able to increase her nutritional intake, we will have to consider feeding tube. Psychiatry was consulted to address her anorexia and placed on Remeron.  Also on dronabinol.  Ascending colon colitis, since resolved, treated with 10 days of antibiotics X Rocephin, Zosyn then oral Augmentin, on Diflucan on March 29 and discontinued on April 4  Bilateral pulmonary embolism/left posterior tibial vein acute DVT: Patient had CT chest ordered for malignancy work-up and found to have bilateral PE incidentally noted was placed on IV heparin and subsequently switched to Eliquis.  Echo showed preserved EF 60 to 60% without regional wall motion abnormalities grade 1 diastolic dysfunction and no right heart strain.  AKI on CKD stage II resolved.  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  Hematuria resolved.  UA showed triple phosphate crystals urine culture Klebsiella and Proteus and repeat urine culture no growth  Thrombocytopenia acute on chronic-resolved current platelet in 150-170k  Urinary retention resolved Foley was placed on April 3 and has been removed.  Constipation:continue laxatives as needed.  Psychosis hallucination resolved.  Major depressive disorder-cont current meds  Goals of care: Palliative care following. If continues to have poor oral intake sister considering hospice.  DVT prophylaxis:eliquis Code Status: DNR  Family Communication: plan of care discussed with patient at bedside. Status is: Inpatient  Remains inpatient  appropriate because:Inpatient level of care appropriate due to severity of illness  Dispo: The patient  is from: Home              Anticipated d/c is to: SNF vs HH. PT advised supervision/assistance 24hr,SNF              Anticipated d/c date is: 2 days              Patient currently is not medically stable to d/c.  Nutrition: Diet Order            DIET - DYS 1 Room service appropriate? Yes; Fluid consistency: Thin  Diet effective now              Nutrition Problem: Moderate Malnutrition Etiology: chronic illness(stroke) Signs/Symptoms: energy intake < 75% for > or equal to 1 month, percent weight loss, edema, mild fat depletion, mild muscle depletion Percent weight loss: 18 % Interventions: Ensure Enlive (each supplement provides 350kcal and 20 grams of protein), Magic cup, Calorie Count, Other (Comment)(1:1 feeding assistance) Body mass index is 32.26 kg/m.  Consultants: Neurology, dentistry, palliative care, psychiatry, cardiology Procedures:see note Microbiology:see note  Medications: Scheduled Meds: . antiseptic oral rinse  15 mL Mouth Rinse BID  . apixaban  5 mg Oral BID  . Chlorhexidine Gluconate Cloth  6 each Topical Daily  . dronabinol  2.5 mg Oral BID AC  . feeding supplement (ENSURE ENLIVE)  237 mL Oral QID  . hydrALAZINE  25 mg Oral Q8H  . insulin aspart  0-15 Units Subcutaneous TID WC  . insulin aspart  0-5 Units Subcutaneous QHS  . latanoprost  1 drop Both Eyes QHS  . lisinopril  20 mg Oral Daily  . mirtazapine  15 mg Oral QHS  . nicotine  21 mg Transdermal Daily  . nystatin  5 mL Oral QID  . potassium chloride  20 mEq Oral Daily  . sodium chloride flush  3 mL Intravenous Once   Continuous Infusions:  Antimicrobials: Anti-infectives (From admission, onward)   Start     Dose/Rate Route Frequency Ordered Stop   08/13/19 2330  piperacillin-tazobactam (ZOSYN) IVPB 3.375 g  Status:  Discontinued     3.375 g 12.5 mL/hr over 240 Minutes Intravenous Every 8 hours 08/13/19 1610 08/15/19 1249   08/13/19 1530  piperacillin-tazobactam (ZOSYN) IVPB 3.375 g  Status:   Discontinued     3.375 g 12.5 mL/hr over 240 Minutes Intravenous Every 8 hours 08/13/19 1412 08/13/19 1610   08/04/19 1015  amoxicillin-clavulanate (AUGMENTIN) 250-62.5 MG/5ML suspension 500 mg     500 mg Oral Every 12 hours 08/04/19 0844 08/08/19 2139   08/03/19 2000  fluconazole (DIFLUCAN) IVPB 100 mg  Status:  Discontinued     100 mg 50 mL/hr over 60 Minutes Intravenous Every 24 hours 08/03/19 1802 08/09/19 1813   08/01/19 1715  piperacillin-tazobactam (ZOSYN) IVPB 3.375 g  Status:  Discontinued     3.375 g 12.5 mL/hr over 240 Minutes Intravenous Every 8 hours 08/01/19 1712 08/04/19 0844   07/30/19 0000  cefTRIAXone (ROCEPHIN) 1 g in sodium chloride 0.9 % 100 mL IVPB  Status:  Discontinued     1 g 200 mL/hr over 30 Minutes Intravenous Every 24 hours 07/29/19 1420 07/29/19 1421   07/29/19 1421  cefTRIAXone (ROCEPHIN) 1 g in sodium chloride 0.9 % 100 mL IVPB  Status:  Discontinued     1 g 200 mL/hr over 30 Minutes Intravenous Every 24 hours 07/29/19 1421 08/01/19 0716  Objective: Vitals: Today's Vitals   08/24/19 0200 08/24/19 0243 08/24/19 0358 08/24/19 0844  BP:   (!) 154/84 (!) 159/86  Pulse:   (!) 101 97  Resp:   18 18  Temp:   98.2 F (36.8 C) 98.1 F (36.7 C)  TempSrc:   Oral Oral  SpO2:   100% 97%  Weight:  80 kg    Height:      PainSc: Asleep 0-No pain      Intake/Output Summary (Last 24 hours) at 08/24/2019 0938 Last data filed at 08/23/2019 2326 Gross per 24 hour  Intake --  Output 300 ml  Net -300 ml   Filed Weights   08/19/19 0441 08/21/19 0436 08/24/19 0243  Weight: 80.9 kg 80.5 kg 80 kg   Weight change:    Intake/Output from previous day: 04/18 0701 - 04/19 0700 In: 40 [P.O.:40] Out: 300 [Urine:300] Intake/Output this shift: No intake/output data recorded.  Examination:  General exam: AAO x2-3,NAD, weak appearing. HEENT:Oral mucosa moist, Ear/Nose WNL grossly,dentition normal. Respiratory system: bilaterally clear,no wheezing or  crackles,no use of accessory muscle, non tender. Cardiovascular system: S1 & S2 +, regular, No JVD. Gastrointestinal system: Abdomen soft, NT,ND, BS+. Nervous System:Alert, awake, moving extremities and grossly nonfocal Extremities: No edema, distal peripheral pulses palpable.  Skin: No rashes,no icterus. MSK: Normal muscle bulk,tone, power  Data Reviewed: I have personally reviewed following labs and imaging studies CBC: Recent Labs  Lab 08/19/19 0035 08/20/19 0352 08/21/19 0001 08/21/19 0429 08/24/19 0048  WBC 4.9 3.6* 5.4 4.8 5.1  NEUTROABS  --  3.0  --   --   --   HGB 12.1 11.7* 11.4* 11.1* 12.1  HCT 36.7 35.4* 34.5* 34.2* 37.3  MCV 92.2 91.2 92.0 92.4 93.3  PLT 153 158 173 158 176   Basic Metabolic Panel: Recent Labs  Lab 08/19/19 0035 08/20/19 0352 08/21/19 0429 08/22/19 0516 08/24/19 0048  NA 137 140 142 141 141  K 3.4* 3.4* 3.9 3.8 4.1  CL 105 109 112* 110 108  CO2 22 23 21* 23 23  GLUCOSE 132* 120* 114* 125* 129*  BUN 10 9 9 10 11   CREATININE 0.75 0.75 0.85 0.82 0.90  CALCIUM 8.9 8.6* 8.9 9.0 9.2  MG 1.8  --   --  1.9 1.9  PHOS  --   --   --   --  3.1   GFR: Estimated Creatinine Clearance: 61.9 mL/min (by C-G formula based on SCr of 0.9 mg/dL). Liver Function Tests: Recent Labs  Lab 08/24/19 0048  AST 29  ALT 23  ALKPHOS 58  BILITOT 0.7  PROT 7.2  ALBUMIN 2.7*   No results for input(s): LIPASE, AMYLASE in the last 168 hours. No results for input(s): AMMONIA in the last 168 hours. Coagulation Profile: No results for input(s): INR, PROTIME in the last 168 hours. Cardiac Enzymes: No results for input(s): CKTOTAL, CKMB, CKMBINDEX, TROPONINI in the last 168 hours. BNP (last 3 results) No results for input(s): PROBNP in the last 8760 hours. HbA1C: No results for input(s): HGBA1C in the last 72 hours. CBG: Recent Labs  Lab 08/23/19 0612 08/23/19 1125 08/23/19 1626 08/23/19 2125 08/24/19 0602  GLUCAP 113* 121* 70 96 122*   Lipid  Profile: No results for input(s): CHOL, HDL, LDLCALC, TRIG, CHOLHDL, LDLDIRECT in the last 72 hours. Thyroid Function Tests: No results for input(s): TSH, T4TOTAL, FREET4, T3FREE, THYROIDAB in the last 72 hours. Anemia Panel: No results for input(s): VITAMINB12, FOLATE, FERRITIN, TIBC, IRON, RETICCTPCT in  the last 72 hours. Sepsis Labs: No results for input(s): PROCALCITON, LATICACIDVEN in the last 168 hours.  No results found for this or any previous visit (from the past 240 hour(s)).    Radiology Studies: No results found.   LOS: 28 days   Time spent: More than 50% of that time was spent in counseling and/or coordination of care.  Lanae Boast, MD [facility]  08/24/2019, 9:38 AM

## 2019-08-24 NOTE — Progress Notes (Signed)
Physical Therapy Treatment Patient Details Name: Denise Santiago MRN: [id number] DOB: 1955/09/21 Today's Date: 08/24/2019    History of Present Illness 64 y.o. female with a hx of DM, HTN, HLD, CVA 12/2018 & 03/2019, ongoing tob use who presented to ED with generalized weakness and elevated troponin, thought to be non specific by cardiology. pt reports weakness after COVID vaccine 07/16/19. The day prior to admission pt slid out of chair to floor and remained through the night.  Addendum 08/24/19: patient has lost more than 50 pounds since November after her last stroke. Malignancy work up: CT chest-acute bilateral pulmonary embolism; CT abd  ascending colon colitis. 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 could not due LP due to anticoagulation. 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      PT Comments    Patient alert and able to participate. She continues to be limited due to profound weakness, ataxia, and intermittent extensor tone (today especially of bil UEs). She frequently speaks off-topic and her train of thought cannot be understood. She leans to left and feels she is at midline. No righting reactions noted when allowed to lean beyond her seated BOS.     Follow Up Recommendations  Supervision/Assistance - 24 hour;SNF     Equipment Recommendations  None recommended by PT    Recommendations for Other Services       Precautions / Restrictions Precautions Precautions: Fall Precaution Comments: reports falls since COVID vaccine Restrictions Weight Bearing Restrictions: No    Mobility  Bed Mobility Overal bed mobility: Needs Assistance Bed Mobility: Rolling;Sidelying to Sit;Sit to Sidelying Rolling: Max assist;+2 for physical assistance Sidelying to sit: +2 for physical assistance;Total assist     Sit to sidelying: Total assist;+2 for physical assistance General bed mobility comments: pt initiated  movement on R side with cues; hand over hand assist to reach for rails; multimodal cues for sequencing; assistance required to bring bilat LE/hips to EOB and then to elevate trunk into sitting; total A +2 to return to supine   Transfers                    Ambulation/Gait Ambulation/Gait assistance: (Unable)               Stairs             Wheelchair Mobility    Modified Rankin (Stroke Patients Only)       Balance Overall balance assessment: Needs assistance Sitting-balance support: Bilateral upper extremity supported;Feet supported Sitting balance-Leahy Scale: Poor Sitting balance - Comments: pt with poor ability to use bil UEs for support (intermittent extensor tone) attempted UEs in ER, extension to prop behind her and pt unable to achieve position; varied mod to total assist                                    Cognition Arousal/Alertness: Awake/alert Behavior During Therapy: Flat affect Overall Cognitive Status: Impaired/Different from baseline Area of Impairment: Memory;Following commands;Safety/judgement;Problem solving;Orientation;Attention                 Orientation Level: Disoriented to;Time;Situation Current Attention Level: Sustained Memory: Decreased short-term memory Following Commands: Follows one step commands with increased time;Follows one step commands inconsistently Safety/Judgement: Decreased awareness of safety;Decreased awareness of deficits Awareness: Intellectual Problem Solving: Slow processing;Decreased initiation;Difficulty sequencing;Requires verbal cues;Requires tactile cues General Comments: pt making very tangential comments throughout  session      Exercises Other Exercises Other Exercises: supine bil LE coordination exercises with RLE more dyscoordinated than LLE    General Comments        Pertinent Vitals/Pain Pain Assessment: Faces Faces Pain Scale: No hurt    Home Living                       Prior Function            PT Goals (current goals can now be found in the care plan section) Acute Rehab PT Goals Patient Stated Goal: none stated PT Goal Formulation: Patient unable to participate in goal setting Time For Goal Achievement: 08/24/19 Potential to Achieve Goals: Fair Progress towards PT goals: (goals reassessed due to timeframe)    Frequency    Min 3X/week      PT Plan Current plan remains appropriate    Co-evaluation              AM-PAC PT "6 Clicks" Mobility   Outcome Measure  Help needed turning from your back to your side while in a flat bed without using bedrails?: A Lot Help needed moving from lying on your back to sitting on the side of a flat bed without using bedrails?: Total Help needed moving to and from a bed to a chair (including a wheelchair)?: Total Help needed standing up from a chair using your arms (e.g., wheelchair or bedside chair)?: Total Help needed to walk in hospital room?: Total Help needed climbing 3-5 steps with a railing? : Total 6 Click Score: 7    End of Session   Activity Tolerance: Patient tolerated treatment well Patient left: with call bell/phone within reach;in bed;with bed alarm set   PT Visit Diagnosis: Muscle weakness (generalized) (M62.81);Difficulty in walking, not elsewhere classified (R26.2)     Time: 1093-2355 PT Time Calculation (min) (ACUTE ONLY): 19 min  Charges:  $Neuromuscular Re-education: 8-22 mins                      Arby Barrette, PT Pager [phone]    Rexanne Mano 08/24/2019, 5:05 PM

## 2019-08-24 NOTE — Progress Notes (Addendum)
Nutrition Follow-up  DOCUMENTATION CODES:   Non-severe (moderate) malnutrition in context of chronic illness  INTERVENTION:  Continue 1:1 feeding assistance Continue Ensure Enlive   Pt's PO intake has not improved and pt is no longer consuming Ensure well. Consider nutrition support vs comfort feeding  If electing for nutrition support, consider: Osmolite 1.2 @ 28m/hr, advance 133mhr Q4H until goal rate of 6077mr is reached (1440m33m30ml34m-stat BID  At goal, tube feeding would provide 1928 kcal, 110 grams protein, 1180ml 43m water.  NUTRITION DIAGNOSIS:   Moderate Malnutrition related to chronic illness(stroke) as evidenced by energy intake < 75% for > or equal to 1 month, percent weight loss, edema, mild fat depletion, mild muscle depletion.  Ongoing.  GOAL:   Patient will meet greater than or equal to 90% of their needs  Not met.   MONITOR:   PO intake, Supplement acceptance, Weight trends, Skin, Labs  REASON FOR ASSESSMENT:   Consult Calorie Count  ASSESSMENT:   Pt with a PMH significant for HTN, DM, HLD, CVA presented to ED with generalized weakness and fall.  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  Pt noted to be refusing Ensure Enlive or only accepting sips.   Pt reports poor appetite and states she does not know why she hasn't been drinking the Ensures. Discussed importance of adequate nutrition with pt and encouraged her to begin consuming supplements in their entirety. Pt expressed understanding.  PO Intake: 0-25% x last 7 recorded meals (5% average intake)  Of note, pt had stated she would not want a feeding tube when she was first admitted, but later MD notes mentioned considering use of one following IVIG treatments if po intake did not improve. Per MD, pt's sister is considering hospice if pt's po intake does not improve.  Given pt's poor po intake and poor acceptance of Ensure  Enlive, nutrition support should be considered vs comfort feeding.   UOP: 300ml x28mours I/O: +4,333.8ml [id number]  Labs reviewed: CBGs 70-122 Medications reviewed and include: Marinol, Ensure Enlive QID, Novolog, Remeron, Klor-con  Diet Order:   Diet Order            DIET - DYS 1 Room service appropriate? Yes; Fluid consistency: Thin  Diet effective now              EDUCATION NEEDS:   Not appropriate for education at this time  Skin:  Skin Assessment: Skin Integrity Issues: Skin Integrity Issues:: Other (Comment) Other: non-pressure wound buttocks  Last BM:  4/18  Height:   Ht Readings from Last 1 Encounters:  08/01/19 5' 2"  (1.575 m)    Weight:   Wt Readings from Last 1 Encounters:  08/24/19 80 kg    BMI:  Body mass index is 32.26 kg/m.  Estimated Nutritional Needs:   Kcal:  [id number]:  110-125 grams  Fluid:  >1.75 L    Maria Gallicchio ALarkin Ina, LDN RD pager number and weekend/on-call pager number located in [email or web address]

## 2019-08-25 DIAGNOSIS — N182 Chronic kidney disease, stage 2 (mild): Secondary | ICD-10-CM

## 2019-08-25 DIAGNOSIS — I10 Essential (primary) hypertension: Secondary | ICD-10-CM

## 2019-08-25 DIAGNOSIS — N179 Acute kidney failure, unspecified: Secondary | ICD-10-CM

## 2019-08-25 LAB — COMPREHENSIVE METABOLIC PANEL
ALT: 23 U/L (ref 0–44)
AST: 35 U/L (ref 15–41)
Albumin: 2.5 g/dL — ABNORMAL LOW (ref 3.5–5.0)
Alkaline Phosphatase: 53 U/L (ref 38–126)
Anion gap: 10 (ref 5–15)
BUN: 14 mg/dL (ref 8–23)
CO2: 20 mmol/L — ABNORMAL LOW (ref 22–32)
Calcium: 9 mg/dL (ref 8.9–10.3)
Chloride: 109 mmol/L (ref 98–111)
Creatinine, Ser: 0.89 mg/dL (ref 0.44–1.00)
GFR calc Af Amer: >60 mL/min (ref >60)
GFR calc non Af Amer: >60 mL/min (ref >60)
Glucose, Bld: 119 mg/dL — ABNORMAL HIGH (ref 70–99)
Potassium: 4.3 mmol/L (ref 3.5–5.1)
Sodium: 139 mmol/L (ref 135–145)
Total Bilirubin: 1 mg/dL (ref 0.3–1.2)
Total Protein: 6.5 g/dL (ref 6.5–8.1)

## 2019-08-25 LAB — CBC
HCT: 35.9 % — ABNORMAL LOW (ref 36.0–46.0)
Hemoglobin: 11.8 g/dL — ABNORMAL LOW (ref 12.0–15.0)
MCH: 30.7 pg (ref 26.0–34.0)
MCHC: 32.9 g/dL (ref 30.0–36.0)
MCV: 93.5 fL (ref 80.0–100.0)
Platelets: 155 10*3/uL (ref 150–400)
RBC: 3.84 MIL/uL — ABNORMAL LOW (ref 3.87–5.11)
RDW: 17.2 % — ABNORMAL HIGH (ref 11.5–15.5)
WBC: 4.2 10*3/uL (ref 4.0–10.5)
nRBC: 0 % (ref 0.0–0.2)

## 2019-08-25 LAB — GLUCOSE, CAPILLARY
Glucose-Capillary: 124 mg/dL — ABNORMAL HIGH (ref 70–99)
Glucose-Capillary: 79 mg/dL (ref 70–99)
Glucose-Capillary: 103 mg/dL — ABNORMAL HIGH (ref 70–99)
Glucose-Capillary: 115 mg/dL — ABNORMAL HIGH (ref 70–99)
Glucose-Capillary: 96 mg/dL (ref 70–99)

## 2019-08-25 NOTE — Progress Notes (Signed)
Daily Progress Note   Patient Name: Denise Santiago       Date: 08/26/2019 DOB: 02/01/1956  Age: 64 y.o. MRN#: [id number] Attending Physician: Lavina Hamman, MD Primary Care Physician: Seward Carol, MD Admit Date: 07/26/2019  Reason for Consultation/Follow-up: Establishing goals of care  Discussion:  Met with Denise Santiago, she was lying in bed watching TV. Awake, A&O x3. Denied shortness of breath. Denied pain but did endorse pain in her right arm when she lifts it to a certain height/position. States she is trying to eat more as some of the foods did not taste like what she is used to but she is going to continue to try to increase her food intake because she knows that she needs to. We discussed at length concerns for her poor po intake and not meeting the required daily nutritional support that her body needs. She verbalized understanding. She states she is planning to drink more of her ensure but does not like it room temperature because it taste like warm milk. Support given.   Reviewed her previously expressed goals and she confirms again that she does not want artificial feeding/peg tube. Support given. Discussed that I am worried that she has not shown improvement in po intake and poor prognosis. She verbalized understanding. We discussed comfort feeds and hospice if she continued to showed no meaningful improvement. I evaluated her understanding of hospice and comfort. She was able to appropriately explain to me what hospice and comfort was in her own wording. She shared that her mother was under hospice care until she passed away acknowledging their support and a good experience during that difficult time.   Denise Santiago states she is hopeful to improve her po intake. She request to allow her "a  few more days" to see how she does and then would like to further discuss next steps from there including her sister. Support given.   Discussed the importance of continued conversations with her family regarding overall plan of care, options, and making sure all decisions are supporting her goals of care for herself. Denise Santiago verbalized understanding and appreciation.   Length of Stay: 30   Physical Exam       -awake, alert, chronically-ill appearing -RRR -Normal efforts  -left arm weakness s/p CVA - mood appropriate, A&O x3, follows commands  Vital Signs:  BP (!) 144/73 (BP Location: Right Arm)   Pulse 100   Temp 98.7 F (37.1 C) (Oral)   Resp 16   Ht _0  (1.575 m)   Wt 78.1 kg   SpO2 100%   BMI 31.49 kg/m  SpO2: SpO2: 100 % O2 Device: O2 Device: Room Air O2 Flow Rate:    Intake/output summary:   Intake/Output Summary (Last 24 hours) at 08/26/2019 4709 Last data filed at 08/25/2019 2316 Gross per 24 hour  Intake 250 ml  Output 350 ml  Net -100 ml   LBM: Last BM Date: 08/23/19 Baseline Weight: Weight: 83.1 kg Most recent weight: Weight: 78.1 kg       Palliative Assessment/Data:    Palliative Care Assessment & Plan   Recommendations/Plan:  Continue with current plan per medical team  Treat what is treatable  Patient requesting "a few more days" to see if her appetite improves and see if her po intake will improve. She is open to comfort/hospice if she does not improve.   PMT will continue to support and follow   Goals of Care and Additional Recommendations:  Limitations on Scope of Treatment: Full Scope Treatment and No Artificial Feeding  Code Status:    Code Status Orders  (From admission, onward)         Start     Ordered   07/26/19 1509  Do not attempt resuscitation (DNR)  Continuous    Question Answer Comment  In the event of cardiac or respiratory ARREST Do not call a "code blue"   In the event of cardiac or respiratory ARREST Do not  perform Intubation, CPR, defibrillation or ACLS   In the event of cardiac or respiratory ARREST Use medication by any route, position, wound care, and other measures to relive pain and suffering. May use oxygen, suction and manual treatment of airway obstruction as needed for comfort.      07/26/19 1510        Code Status History    Date Active Date Inactive Code Status Order ID Comments User Context   03/19/2019 0142 03/21/2019 1948 Full Code [id number]  Neena Rhymes, MD ED   12/10/2018 1015 12/11/2018 2120 Full Code [id number]  Epifanio Lesches, MD ED   Advance Care Planning Activity      Prognosis:  Guarded to Poor   Discharge Planning:  To Be Determined  Thank you for allowing the Palliative Medicine Team to assist in the care of this patient.  Time Total: 35 min.   Visit consisted of counseling and education dealing with the complex and emotionally intense issues of symptom management and palliative care in the setting of serious and potentially life-threatening illness.Greater than 50%  of this time was spent counseling and coordinating care related to the above assessment and plan.  Alda Lea, AGPCNP-BC  Palliative Medicine Team [phone]   Please contact Palliative Medicine Team phone at [phone] for questions and concerns.

## 2019-08-25 NOTE — Progress Notes (Signed)
PROGRESS NOTE    Denise Santiago  ALP:[id number] DOB: Nov 04, 1955 DOA: 07/26/2019 PCP: Renford Dills, MD   Brief Narrative: 64 year old female with history of hypertension, diabetes, hyperlipidemia, CVA on 12/25/2018 and 03/27/2019-has a loop recorder, history of tobacco abuse presented to ED with generalized weakness pm 07/26/19.  Patient reported taking Covid vaccine on Saturday, has been feeling weak lethargic, slid out of her chair onto the floor and was not able to get up 1 day PTA.  EMS was called and brought to the ED as patient not able to get up from the floor.  Recently lost 50 pound weight since November since her last stroke, normally lives alone uses walker at home. Patient was found to have acute bilateral pulmonary embolism and ascending colon colitis.  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  Subjective: Alert,awake. Mildly confused abt month, knows current year and president No acute events overnight.  T-max 98.1. 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 Due to anticoagulation for PE treatment, not able to perform LP safely, Sp patient empirically completed 5 days of IVIG. Cont PT OT supportive care. ANA normal, CK nl. AntiGQ1b antibody pending  Failure to thrive with progressive weakness/moderate priotein-calorie malnutrition/decreased oral intake/weight loss: Multifactorial, continue supportive care  dietitianPT OT following.  Off statin.  Nutrition following for calorie count, placed on Remeron to stimulate appetite.  48 hour calorie count over the weekend demonstrates that the patient oral intake remains low. However, she does drink ensure without difficulty. Per nutrition if she is able to take 4 cans of ensure daily,she will need 80% of her nutritional needs. If she is not able to increase her nutritional intake, we will have to consider feeding tube. Psychiatry was consulted to address her anorexia and placed on Remeron.  Also on dronabinol. Po intake up to 25%. Only drinking half of her ensure.She reports she is eating some now.  Continue to augment nutrition, encourage patient.  Ascending colon colitis, since resolved, treated with 10 days of antibiotics X Rocephin, Zosyn then oral Augmentin, on Diflucan on March 29 and discontinued on April 4  Bilateral pulmonary embolism/left posterior tibial vein acute DVT: Patient had CT chest ordered for malignancy work-up and found to have bilateral PE incidentally noted was placed on IV heparin and subsequently switched to Eliquis.  Echo showed preserved EF 60 to 60% without regional wall motion abnormalities grade 1 diastolic dysfunction and no right heart strain.  AKI on CKD stage II resolved. 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  Hematuria resolved.  UA showed triple phosphate crystals urine culture Klebsiella and Proteus and repeat urine culture no growth  Thrombocytopenia acute on chronic-resolved current platelet in 150-170k  Urinary retention resolved Foley was placed on April 3 and has been removed.  Constipation:continue laxatives as needed.  Psychosis hallucination resolved.  Major  depressive disorder-cont current meds  Goals of care: Seen by palliative care completed MOST form  patient is DNAR/DNI, treat what is treatable, transition of care with outpatient palliative, plan for SNF placement.   Weight loss patient had malignancy work-up with CT scan abdomen pelvis was found out PE, CA 19-9 AFP which are normal. encourage po.  Covid vaccination on March 13 second dose was  scheduled for a April 7  DVT prophylaxis:eliquis Code Status: DNR  Family Communication: plan of care discussed with patient at bedside. Status is: Inpatient  Remains inpatient appropriate because:Inpatient level of care appropriate due to severity of illness.  Difficult disposition due to uninsured,GBS with mild improvement and poor oral intake.  Dispo:The patient is from: Home            Anticipated d/c is to: SNF             Anticipated d/c date is: When bed available            Patient currently medically stable for discharge  Nutrition: Diet Order            DIET - DYS 1 Room service appropriate? Yes; Fluid consistency: Thin  Diet effective now              Nutrition Problem: Moderate Malnutrition Etiology: chronic illness(stroke) Signs/Symptoms: energy intake < 75% for > or equal to 1 month, percent weight loss, edema, mild fat depletion, mild muscle depletion Percent weight loss: 18 % Interventions: Ensure Enlive (each supplement provides 350kcal and 20 grams of protein), Magic cup, Calorie Count, Other (Comment)(1:1 feeding assistance) Body mass index is 31.09 kg/m.  Consultants: Neurology, dentistry, palliative care, psychiatry, cardiology Procedures:see note Microbiology:see note  Medications: Scheduled Meds: . antiseptic oral rinse  15 mL Mouth Rinse BID  . apixaban  5 mg Oral BID  . dronabinol  2.5 mg Oral BID AC  . feeding supplement (ENSURE ENLIVE)  237 mL Oral QID  . hydrALAZINE  25 mg Oral Q8H  . insulin aspart  0-15 Units Subcutaneous TID WC  . insulin aspart  0-5 Units Subcutaneous QHS  . latanoprost  1 drop Both Eyes QHS  . lisinopril  20 mg Oral Daily    . mirtazapine  15 mg Oral QHS  . nicotine  21 mg Transdermal Daily  . nystatin  5 mL Oral QID  . potassium chloride  20 mEq Oral Daily  . sodium chloride flush  3 mL Intravenous Once   Continuous Infusions:  Antimicrobials: Anti-infectives (From admission, onward)   Start     Dose/Rate Route Frequency Ordered Stop   08/13/19 2330  piperacillin-tazobactam (ZOSYN) IVPB 3.375 g  Status:  Discontinued     3.375 g 12.5 mL/hr over 240 Minutes Intravenous Every 8 hours 08/13/19 1610 08/15/19 1249   08/13/19 1530  piperacillin-tazobactam (ZOSYN) IVPB 3.375 g  Status:  Discontinued     3.375 g 12.5 mL/hr over 240 Minutes Intravenous Every 8 hours 08/13/19 1412 08/13/19 1610   08/04/19 1015  amoxicillin-clavulanate (AUGMENTIN) 250-62.5 MG/5ML suspension 500 mg     500 mg Oral Every 12 hours 08/04/19 0844 08/08/19 2139   08/03/19 2000  fluconazole (DIFLUCAN) IVPB 100 mg  Status:  Discontinued     100 mg 50 mL/hr over 60 Minutes Intravenous Every 24 hours 08/03/19 1802 08/09/19 1813   08/01/19 1715  piperacillin-tazobactam (ZOSYN) IVPB 3.375 g  Status:  Discontinued     3.375 g 12.5 mL/hr over 240 Minutes Intravenous Every 8 hours  08/01/19 1712 08/04/19 0844   07/30/19 0000  cefTRIAXone (ROCEPHIN) 1 g in sodium chloride 0.9 % 100 mL IVPB  Status:  Discontinued     1 g 200 mL/hr over 30 Minutes Intravenous Every 24 hours 07/29/19 1420 07/29/19 1421   07/29/19 1421  cefTRIAXone (ROCEPHIN) 1 g in sodium chloride 0.9 % 100 mL IVPB  Status:  Discontinued     1 g 200 mL/hr over 30 Minutes Intravenous Every 24 hours 07/29/19 1421 08/01/19 0716       Objective: Vitals: Today's Vitals   08/25/19 0241 08/25/19 0408 08/25/19 0500 08/25/19 0811  BP:  (!) 152/70  112/82  Pulse:  97  90  Resp:  18  16  Temp:  98 F (36.7 C)  98.1 F (36.7 C)  TempSrc:  Oral  Oral  SpO2:  99%  100%  Weight:   77.1 kg   Height:      PainSc: Asleep  Asleep     Intake/Output Summary (Last 24 hours) at  08/25/2019 1046 Last data filed at 08/25/2019 0900 Gross per 24 hour  Intake 270 ml  Output 400 ml  Net -130 ml   Filed Weights   08/21/19 0436 08/24/19 0243 08/25/19 0500  Weight: 80.5 kg 80 kg 77.1 kg   Weight change: -2.9 kg   Intake/Output from previous day: 04/19 0701 - 04/20 0700 In: 240 [P.O.:240] Out: 400 [Urine:400] Intake/Output this shift: Total I/O In: 30 [P.O.:30] Out: -   Examination:  General exam: AAO x2-3,NAD, weak appearing. HEENT:Oral mucosa moist, Ear/Nose WNL grossly,dentition normal. Respiratory system: bilaterally clear, no use of accessory muscle, non tender. Cardiovascular system: S1 & S2 +, regular, No JVD. Gastrointestinal system: Abdomen soft, NT,ND, BS+. Nervous System: Alert,awake, moving LE able to draw up leg both from the beds, weak on rt arm and left arm Extremities: No edema, distal peripheral pulses palpable.  Skin: No rashes,no icterus. MSK: Normal muscle bulk,tone, power  Data Reviewed: I have personally reviewed following labs and imaging studies CBC: Recent Labs  Lab 08/20/19 0352 08/21/19 0001 08/21/19 0429 08/24/19 0048 08/25/19 0417  WBC 3.6* 5.4 4.8 5.1 4.2  NEUTROABS 3.0  --   --   --   --   HGB 11.7* 11.4* 11.1* 12.1 11.8*  HCT 35.4* 34.5* 34.2* 37.3 35.9*  MCV 91.2 92.0 92.4 93.3 93.5  PLT 158 173 158 176 155   Basic Metabolic Panel: Recent Labs  Lab 08/19/19 0035 08/19/19 0035 08/20/19 0352 08/21/19 0429 08/22/19 0516 08/24/19 0048 08/25/19 0417  NA 137   < > 140 142 141 141 139  K 3.4*   < > 3.4* 3.9 3.8 4.1 4.3  CL 105   < > 109 112* 110 108 109  CO2 22   < > 23 21* 23 23 20*  GLUCOSE 132*   < > 120* 114* 125* 129* 119*  BUN 10   < > 9 9 10 11 14   CREATININE 0.75   < > 0.75 0.85 0.82 0.90 0.89  CALCIUM 8.9   < > 8.6* 8.9 9.0 9.2 9.0  MG 1.8  --   --   --  1.9 1.9  --   PHOS  --   --   --   --   --  3.1  --    < > = values in this interval not displayed.   GFR: Estimated Creatinine Clearance: 61.4  mL/min (by C-G formula based on SCr of 0.89 mg/dL). Liver Function Tests:  Recent Labs  Lab 08/24/19 0048 08/25/19 0417  AST 29 35  ALT 23 23  ALKPHOS 58 53  BILITOT 0.7 1.0  PROT 7.2 6.5  ALBUMIN 2.7* 2.5*   No results for input(s): LIPASE, AMYLASE in the last 168 hours. No results for input(s): AMMONIA in the last 168 hours. Coagulation Profile: No results for input(s): INR, PROTIME in the last 168 hours. Cardiac Enzymes: No results for input(s): CKTOTAL, CKMB, CKMBINDEX, TROPONINI in the last 168 hours. BNP (last 3 results) No results for input(s): PROBNP in the last 8760 hours. HbA1C: No results for input(s): HGBA1C in the last 72 hours. CBG: Recent Labs  Lab 08/24/19 1132 08/24/19 1627 08/24/19 2108 08/25/19 0602 08/25/19 0813  GLUCAP 99 114* 103* 124* 79   Lipid Profile: No results for input(s): CHOL, HDL, LDLCALC, TRIG, CHOLHDL, LDLDIRECT in the last 72 hours. Thyroid Function Tests: No results for input(s): TSH, T4TOTAL, FREET4, T3FREE, THYROIDAB in the last 72 hours. Anemia Panel: No results for input(s): VITAMINB12, FOLATE, FERRITIN, TIBC, IRON, RETICCTPCT in the last 72 hours. Sepsis Labs: No results for input(s): PROCALCITON, LATICACIDVEN in the last 168 hours.  No results found for this or any previous visit (from the past 240 hour(s)).    Radiology Studies: No results found.   LOS: 29 days   Time spent: More than 50% of that time was spent in counseling and/or coordination of care.  Lanae Boast, MD [facility]  08/25/2019, 10:46 AM

## 2019-08-26 LAB — COMPREHENSIVE METABOLIC PANEL
ALT: 26 U/L (ref 0–44)
AST: 37 U/L (ref 15–41)
Albumin: 2.6 g/dL — ABNORMAL LOW (ref 3.5–5.0)
Alkaline Phosphatase: 56 U/L (ref 38–126)
Anion gap: 8 (ref 5–15)
BUN: 13 mg/dL (ref 8–23)
CO2: 22 mmol/L (ref 22–32)
Calcium: 9 mg/dL (ref 8.9–10.3)
Chloride: 107 mmol/L (ref 98–111)
Creatinine, Ser: 0.9 mg/dL (ref 0.44–1.00)
GFR calc Af Amer: >60 mL/min (ref >60)
GFR calc non Af Amer: >60 mL/min (ref >60)
Glucose, Bld: 113 mg/dL — ABNORMAL HIGH (ref 70–99)
Potassium: 3.8 mmol/L (ref 3.5–5.1)
Sodium: 137 mmol/L (ref 135–145)
Total Bilirubin: 0.5 mg/dL (ref 0.3–1.2)
Total Protein: 6.6 g/dL (ref 6.5–8.1)

## 2019-08-26 LAB — CBC
HCT: 34.7 % — ABNORMAL LOW (ref 36.0–46.0)
Hemoglobin: 11.3 g/dL — ABNORMAL LOW (ref 12.0–15.0)
MCH: 30.4 pg (ref 26.0–34.0)
MCHC: 32.6 g/dL (ref 30.0–36.0)
MCV: 93.3 fL (ref 80.0–100.0)
Platelets: 169 10*3/uL (ref 150–400)
RBC: 3.72 MIL/uL — ABNORMAL LOW (ref 3.87–5.11)
RDW: 17 % — ABNORMAL HIGH (ref 11.5–15.5)
WBC: 4.3 10*3/uL (ref 4.0–10.5)
nRBC: 0 % (ref 0.0–0.2)

## 2019-08-26 LAB — GLUCOSE, CAPILLARY
Glucose-Capillary: 116 mg/dL — ABNORMAL HIGH (ref 70–99)
Glucose-Capillary: 106 mg/dL — ABNORMAL HIGH (ref 70–99)
Glucose-Capillary: 105 mg/dL — ABNORMAL HIGH (ref 70–99)
Glucose-Capillary: 100 mg/dL — ABNORMAL HIGH (ref 70–99)
Glucose-Capillary: 114 mg/dL — ABNORMAL HIGH (ref 70–99)

## 2019-08-26 MED ORDER — METOPROLOL TARTRATE 25 MG PO TABS
25.0000 mg | ORAL_TABLET | Freq: Two times a day (BID) | ORAL | Status: DC
  Administered 2019-08-26 – 2019-09-04 (×16): 25 mg via ORAL
  Filled 2019-08-26 (×17): qty 1

## 2019-08-26 NOTE — Progress Notes (Signed)
Benefits check submitted for Eliquis.  

## 2019-08-26 NOTE — Progress Notes (Signed)
Physical Therapy Treatment Patient Details Name: Denise Santiago MRN: [id number] DOB: [birth year] Today's Date: 08/26/2019    History of Present Illness 64 y.o. female with a hx of DM, HTN, HLD, CVA 12/2018 & 03/2019, ongoing tob use who presented to ED with generalized weakness and elevated troponin, thought to be non specific by cardiology. pt reports weakness after COVID vaccine 07/16/19. The day prior to admission pt slid out of chair to floor and remained through the night.  Addendum 08/24/19: patient has lost more than 50 pounds since November after her last stroke. Malignancy work up: CT chest-acute bilateral pulmonary embolism; CT abd  ascending colon colitis. 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 could not due LP due to anticoagulation. 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      PT Comments    Patient in bed upon arrival and awake/alert. Pt oriented to self only. Pt continues to become lethargic and less engaged with therapists when mobilizing and keeping eyes closed while in sitting EOB. Pt requires +2 assist for all mobility given ataxia, poor proprioception, and poor motor planning. PT will continue to follow acutely and progress as tolerated with anticipated d/c to SNF.    Follow Up Recommendations  Supervision/Assistance - 24 hour;SNF     Equipment Recommendations  None recommended by PT    Recommendations for Other Services       Precautions / Restrictions Precautions Precautions: Fall Restrictions Weight Bearing Restrictions: No    Mobility  Bed Mobility Overal bed mobility: Needs Assistance Bed Mobility: Supine to Sit;Rolling;Sit to Supine Rolling: +2 for physical assistance;Total assist   Supine to sit: +2 for physical assistance;Total assist Sit to supine: Total assist;+2 for physical assistance   General bed mobility comments: multimodal cues for sequencing; assist for all bed mobility    Transfers                    Ambulation/Gait                 Stairs             Wheelchair Mobility    Modified Rankin (Stroke Patients Only) Modified Rankin (Stroke Patients Only) Pre-Morbid Rankin Score: No significant disability Modified Rankin: Severe disability     Balance Overall balance assessment: Needs assistance Sitting-balance support: Bilateral upper extremity supported;Feet supported Sitting balance-Leahy Scale: Zero Sitting balance - Comments: pt unable to maintain sitting balance EOB without max A; pt with posterior and L lateral lean; worked on anterior and R lateral trunk flexion Postural control: Posterior lean;Left lateral lean                                  Cognition Arousal/Alertness: Awake/alert(until mobilizing ) Behavior During Therapy: Flat affect Overall Cognitive Status: Impaired/Different from baseline Area of Impairment: Memory;Following commands;Safety/judgement;Problem solving;Orientation;Attention                 Orientation Level: Disoriented to;Time;Situation Current Attention Level: Sustained Memory: Decreased short-term memory Following Commands: Follows one step commands with increased time;Follows one step commands inconsistently Safety/Judgement: Decreased awareness of safety;Decreased awareness of deficits Awareness: Intellectual Problem Solving: Slow processing;Decreased initiation;Difficulty sequencing;Requires verbal cues;Requires tactile cues General Comments: pt alert upon arrival but lethargic and keeping eyes closed once mobilizing; pt states we are in a lab and when given choices reports we are at the grocery store vs hospital  Exercises      General Comments        Pertinent Vitals/Pain Pain Assessment: Faces Faces Pain Scale: Hurts little more Pain Location: shoulders/chest with bilat shoulder flexion Pain Descriptors / Indicators: Guarding;Grimacing Pain  Intervention(s): Limited activity within patient's tolerance;Repositioned    Home Living Family/patient expects to be discharged to:: Skilled nursing facility Living Arrangements: Alone                  Prior Function            PT Goals (current goals can now be found in the care plan section) Progress towards PT goals: Not progressing toward goals - comment    Frequency    Min 3X/week      PT Plan Current plan remains appropriate    Co-evaluation PT/OT/SLP Co-Evaluation/Treatment: Yes Reason for Co-Treatment: Complexity of the patient's impairments (multi-system involvement);Necessary to address cognition/behavior during functional activity;For patient/therapist safety;To address functional/ADL transfers PT goals addressed during session: Mobility/safety with mobility        AM-PAC PT "6 Clicks" Mobility   Outcome Measure  Help needed turning from your back to your side while in a flat bed without using bedrails?: Total Help needed moving from lying on your back to sitting on the side of a flat bed without using bedrails?: Total Help needed moving to and from a bed to a chair (including a wheelchair)?: Total Help needed standing up from a chair using your arms (e.g., wheelchair or bedside chair)?: Total Help needed to walk in hospital room?: Total Help needed climbing 3-5 steps with a railing? : Total 6 Click Score: 6    End of Session   Activity Tolerance: Patient tolerated treatment well Patient left: with call bell/phone within reach;in bed;with bed alarm set Nurse Communication: Mobility status PT Visit Diagnosis: Muscle weakness (generalized) (M62.81);Difficulty in walking, not elsewhere classified (R26.2)     Time: 7425-9563 PT Time Calculation (min) (ACUTE ONLY): 23 min  Charges:  $Therapeutic Activity: 8-22 mins                     Earney Navy, PTA Acute Rehabilitation Services Pager: [phone] Office: [phone]     Darliss Cheney 08/26/2019, 1:22 PM

## 2019-08-26 NOTE — Progress Notes (Signed)
Occupational Therapy Treatment Patient Details Name: Denise Santiago MRN: [id number] DOB: [birth year] Today's Date: 08/26/2019    History of present illness 64 y.o. female with a hx of DM, HTN, HLD, CVA 12/2018 & 03/2019, ongoing tob use who presented to ED with generalized weakness and elevated troponin, thought to be non specific by cardiology. pt reports weakness after COVID vaccine 07/16/19. The day prior to admission pt slid out of chair to floor and remained through the night.  Addendum 08/24/19: patient has lost more than 50 pounds since November after her last stroke. Malignancy work up: CT chest-acute bilateral pulmonary embolism; CT abd  ascending colon colitis. 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 could not due LP due to anticoagulation. 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     OT comments  Cotx with PT to maximize pt safety and functional performance. All goals reviewed and updated. Pt making slow progress in therapy with session limited this date due to fatigue. Pt alert and awake while supine in bed, however once therapy starts mobilizing pt she becomes extremely lethargic unable to keep eyes open for more than a few seconds despite max multimodal cues. Pt tolerated sitting EOB 10 min with total assist for balance. Continual retro and left lateral lean while seated with pt unable to self-correct. Pt unable to initiate or sequence one step commands. Pt assisted back to bed and positioned for comfort. OT will continue to follow acutely. Continue to recommend SNF placement for additional rehab prior to discharge home.   Follow Up Recommendations  SNF;Supervision/Assistance - 24 hour    Equipment Recommendations  Other (comment)(TBD at next venue of care)    Recommendations for Other Services      Precautions / Restrictions Precautions Precautions: Fall Restrictions Weight Bearing Restrictions: No        Mobility Bed Mobility Overal bed mobility: Needs Assistance Bed Mobility: Supine to Sit;Rolling;Sit to Supine Rolling: +2 for physical assistance;Total assist   Supine to sit: +2 for physical assistance;Total assist Sit to supine: Total assist;+2 for physical assistance   General bed mobility comments: Pt does not initiate tasks or follow cues. Assist for BLEs and trunk.   Transfers                 General transfer comment: Not attempted this date due to safety concerns as pt is unable to maintain static sitting balance without total assist.     Balance Overall balance assessment: Needs assistance Sitting-balance support: Bilateral upper extremity supported;Feet supported Sitting balance-Leahy Scale: Zero Sitting balance - Comments: Pt tolerated sitting EOB 10 min requiring total assist for balance. Continual retro and left lateral lean while seated with pt unable to self-correct. Worked on leaning to right and weight bearing through right elbow. Pt able to push self into midline from right elbow with min assist. Worked on leaning forward with pt fearful of falling and pushing self backwards.   Postural control: Posterior lean;Left lateral lean                                 ADL either performed or assessed with clinical judgement   ADL Overall ADL's : Needs assistance/impaired     Grooming: Total assistance;Wash/dry face Grooming Details (indicate cue type and reason): While seated EOB. Pt does not initiate or assist with task.  Functional mobility during ADLs: Total assistance;+2 for physical assistance General ADL Comments: Pt tolerated sitting EOB ~10 min with total assist to maintain balance. Pt required max multimodal cues to maintain alertness.      Vision       Perception     Praxis      Cognition Arousal/Alertness: Lethargic(Pt awake in bed, however becomes lethargic once upright.) Behavior During  Therapy: Flat affect Overall Cognitive Status: Impaired/Different from baseline Area of Impairment: Orientation;Attention;Memory;Following commands;Safety/judgement;Awareness;Problem solving                 Orientation Level: Disoriented to;Place;Time;Situation Current Attention Level: Sustained Memory: Decreased short-term memory Following Commands: Follows one step commands inconsistently;Follows one step commands with increased time Safety/Judgement: Decreased awareness of safety;Decreased awareness of deficits Awareness: Intellectual Problem Solving: Slow processing;Decreased initiation;Difficulty sequencing;Requires verbal cues;Requires tactile cues General Comments: Pt awake upon arrival, however once mobilizing pt becomes extremely lethargic unable to keep eyes open for more than a few seconds despite max multimodal cues. Pt minimally follows one step commands.         Exercises     Shoulder Instructions       General Comments      Pertinent Vitals/ Pain       Pain Assessment: Faces Faces Pain Scale: Hurts little more Pain Location: shoulders/chest with bilat shoulder flexion Pain Descriptors / Indicators: Guarding;Grimacing Pain Intervention(s): Limited activity within patient's tolerance;Monitored during session;Repositioned  Home Living Family/patient expects to be discharged to:: Skilled nursing facility Living Arrangements: Alone                                      Prior Functioning/Environment              Frequency           Progress Toward Goals  OT Goals(current goals can now be found in the care plan section)  Progress towards OT goals: Progressing toward goals(limited this date due to fatigue)  ADL Goals Additional ADL Goal #2: Pt to tolerate sitting EOB 10 min with mod assist for balance, in preparation for ADLs.  Plan Discharge plan remains appropriate    Co-evaluation    PT/OT/SLP Co-Evaluation/Treatment:  Yes Reason for Co-Treatment: Complexity of the patient's impairments (multi-system involvement);Necessary to address cognition/behavior during functional activity;For patient/therapist safety;To address functional/ADL transfers PT goals addressed during session: Mobility/safety with mobility OT goals addressed during session: ADL's and self-care;Strengthening/ROM      AM-PAC OT "6 Clicks" Daily Activity     Outcome Measure   Help from another person eating meals?: A Lot Help from another person taking care of personal grooming?: Total Help from another person toileting, which includes using toliet, bedpan, or urinal?: Total Help from another person bathing (including washing, rinsing, drying)?: Total Help from another person to put on and taking off regular upper body clothing?: Total Help from another person to put on and taking off regular lower body clothing?: Total 6 Click Score: 7    End of Session    OT Visit Diagnosis: Unsteadiness on feet (R26.81);Muscle weakness (generalized) (M62.81);Other symptoms and signs involving cognitive function;Other symptoms and signs involving the nervous system (R29.898)   Activity Tolerance Patient limited by fatigue;Patient limited by lethargy   Patient Left in bed;with call bell/phone within reach;with bed alarm set   Nurse Communication Mobility status        Time: 9242-6834 OT Time Calculation (min):  23 min  Charges: OT General Charges $OT Visit: 1 Visit OT Treatments $Therapeutic Activity: 8-22 mins  Mauri Brooklyn OTR/L [phone]   Mauri Brooklyn 08/26/2019, 2:47 PM

## 2019-08-26 NOTE — TOC Benefit Eligibility Note (Signed)
Transition of Care [facility]) Benefit Eligibility Note    Patient Details  Name: Denise Santiago MRN: [id number] Date of Birth: 05-23-1955   Medication/Dose: Everlene Balls  5 MG BID        Prescription Coverage Preferred Pharmacy: MEDICAID  [facility] , EFF-DAT- 05-08-2019                   Mardene Sayer Phone Number: 08/26/2019, 10:16 AM

## 2019-08-26 NOTE — Progress Notes (Signed)
PROGRESS NOTE    Denise Santiago  EHU:[id number] DOB: [birth year] DOA: 07/26/2019 PCP: Renford Dills, MD   Brief Narrative: 64 year old female with history of hypertension, diabetes, hyperlipidemia, CVA on 12/25/2018 and 03/27/2019-has a loop recorder, history of tobacco abuse presented to ED with generalized weakness pm 07/26/19.  Patient reported taking Covid vaccine on Saturday, has been feeling weak lethargic, slid out of her chair onto the floor and was not able to get up 1 day PTA.  EMS was called and brought to the ED as patient not able to get up from the floor.  Recently lost 50 pound weight since November since her last stroke, normally lives alone uses walker at home. Patient was found to have acute bilateral pulmonary embolism and ascending colon colitis.  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  Subjective: Alert and awake.  Reports fatigue and tiredness.  No acute events overnight.  No chest pain abdominal pain.  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 Due to anticoagulation for PE treatment, not able to perform LP safely, Sp patient empirically completed 5 days of IVIG. Cont PT OT supportive care. ANA normal, CK nl. AntiGQ1b antibody pending  Failure to thrive with progressive weakness/moderate priotein-calorie malnutrition/decreased oral intake/weight loss: Multifactorial, continue supportive care dietitianPT OT following.  Off statin.  Nutrition following for calorie count, placed on Remeron to  stimulate appetite.  48 hour calorie count over the weekend demonstrates that the patient oral intake remains low. However, she does drink ensure without difficulty. Per nutrition if she is able to take 4 cans of ensure daily,she will need 80% of her nutritional needs. If she is not able to increase her nutritional intake, we will have to consider feeding tube. Psychiatry was consulted to address her anorexia and placed on Remeron.  Also on dronabinol. Po intake up to 25%. Only drinking half of her ensure.She reports she is eating some now.  Continue to augment nutrition, encourage patient.  Ascending colon colitis, since resolved, treated with 10 days of antibiotics X Rocephin, Zosyn then oral Augmentin, on Diflucan on March 29 and discontinued on April 4  Bilateral pulmonary embolism/left posterior tibial vein acute DVT: Patient had CT chest ordered for malignancy work-up and found to have bilateral PE incidentally noted was placed on IV heparin and subsequently switched to Eliquis.  Echo showed preserved EF 60 to 60% without regional wall motion abnormalities grade 1 diastolic dysfunction and no right heart strain.  AKI on CKD stage II resolved. 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  Hematuria resolved.  UA showed triple phosphate crystals urine culture Klebsiella and Proteus and repeat urine culture no growth  Thrombocytopenia acute on chronic-resolved current platelet in 150-170k  Urinary retention resolved Foley was placed on April 3 and has been removed.  Constipation:continue laxatives as needed.  Psychosis hallucination resolved.  Major depressive disorder-cont current meds  Essential hypertension. Added Lopressor.  Goals of care: Seen by palliative care  completed MOST form patient is DNAR/DNI, treat what is treatable, transition  of care with outpatient palliative, plan for SNF placement.   Weight loss patient had malignancy work-up with CT scan abdomen pelvis was found out PE, CA 19-9 AFP which are normal. encourage po.  Covid vaccination on March 13 second dose was  scheduled for a April 7  DVT prophylaxis:eliquis Code Status: DNR  Family Communication: plan of care discussed with patient at bedside. Status is: Inpatient  Remains inpatient appropriate because:Inpatient level of care appropriate due to severity of illness.  Difficult disposition due to uninsured,GBS with mild improvement and poor oral intake.  Dispo:The patient is from: Home            Anticipated d/c is to: SNF             Anticipated d/c date is: When bed available            Patient currently medically stable for discharge  Nutrition: Diet Order            DIET - DYS 1 Room service appropriate? Yes; Fluid consistency: Thin  Diet effective now              Nutrition Problem: Moderate Malnutrition Etiology: chronic illness(stroke) Signs/Symptoms: energy intake < 75% for > or equal to 1 month, percent weight loss, edema, mild fat depletion, mild muscle depletion Percent weight loss: 18 % Interventions: Ensure Enlive (each supplement provides 350kcal and 20 grams of protein), Magic cup, Calorie Count, Other (Comment)(1:1 feeding assistance) Body mass index is 31.49 kg/m.  Consultants: Neurology, dentistry, palliative care, psychiatry, cardiology Procedures:see note Microbiology:see note  Medications: Scheduled Meds: . antiseptic oral rinse  15 mL Mouth Rinse BID  . apixaban  5 mg Oral BID  . dronabinol  2.5 mg Oral BID AC  . feeding supplement (ENSURE ENLIVE)  237 mL Oral QID  . hydrALAZINE  25 mg Oral Q8H  . insulin aspart  0-15 Units Subcutaneous TID WC  . insulin aspart  0-5 Units Subcutaneous QHS  . latanoprost  1 drop Both Eyes QHS  . lisinopril  20 mg Oral Daily  . metoprolol tartrate  25 mg Oral BID  . mirtazapine  15  mg Oral QHS  . nicotine  21 mg Transdermal Daily  . nystatin  5 mL Oral QID  . potassium chloride  20 mEq Oral Daily  . sodium chloride flush  3 mL Intravenous Once   Continuous Infusions:  Antimicrobials: Anti-infectives (From admission, onward)   Start     Dose/Rate Route Frequency Ordered Stop   08/13/19 2330  piperacillin-tazobactam (ZOSYN) IVPB 3.375 g  Status:  Discontinued     3.375 g 12.5 mL/hr over 240 Minutes Intravenous Every 8 hours 08/13/19 1610 08/15/19 1249   08/13/19 1530  piperacillin-tazobactam (ZOSYN) IVPB 3.375 g  Status:  Discontinued     3.375 g 12.5 mL/hr over 240 Minutes Intravenous Every 8 hours 08/13/19 1412 08/13/19 1610   08/04/19 1015  amoxicillin-clavulanate (AUGMENTIN) 250-62.5 MG/5ML suspension 500 mg     500 mg Oral Every 12 hours 08/04/19 0844 08/08/19 2139   08/03/19 2000  fluconazole (DIFLUCAN) IVPB 100 mg  Status:  Discontinued     100 mg 50 mL/hr over 60 Minutes Intravenous Every 24 hours 08/03/19 1802 08/09/19 1813   08/01/19 1715  piperacillin-tazobactam (ZOSYN) IVPB 3.375 g  Status:  Discontinued     3.375 g 12.5 mL/hr over 240 Minutes Intravenous Every 8 hours 08/01/19 1712 08/04/19 0844  07/30/19 0000  cefTRIAXone (ROCEPHIN) 1 g in sodium chloride 0.9 % 100 mL IVPB  Status:  Discontinued     1 g 200 mL/hr over 30 Minutes Intravenous Every 24 hours 07/29/19 1420 07/29/19 1421   07/29/19 1421  cefTRIAXone (ROCEPHIN) 1 g in sodium chloride 0.9 % 100 mL IVPB  Status:  Discontinued     1 g 200 mL/hr over 30 Minutes Intravenous Every 24 hours 07/29/19 1421 08/01/19 0716       Objective: Vitals: Today's Vitals   08/26/19 1138 08/26/19 1526 08/26/19 2002 08/26/19 2027  BP: (!) 120/96 120/67  112/74  Pulse: 91 (!) 105  95  Resp: 18 18  19   Temp: 98.3 F (36.8 C) 97.9 F (36.6 C)  98.6 F (37 C)  TempSrc: Oral Oral  Oral  SpO2: 99% 98%  99%  Weight:      Height:      PainSc:   0-No pain     Intake/Output Summary (Last 24 hours) at  08/26/2019 2031 Last data filed at 08/26/2019 1800 Gross per 24 hour  Intake 350 ml  Output 350 ml  Net 0 ml   Filed Weights   08/24/19 0243 08/25/19 0500 08/26/19 0500  Weight: 80 kg 77.1 kg 78.1 kg   Weight change: 1 kg   Intake/Output from previous day: 04/20 0701 - 04/21 0700 In: 250 [P.O.:250] Out: 350 [Urine:350] Intake/Output this shift: No intake/output data recorded.  Examination:  General exam: AAO x2-3,NAD, weak appearing. HEENT:Oral mucosa moist, Ear/Nose WNL grossly,dentition normal. Respiratory system: bilaterally clear, no use of accessory muscle, non tender. Cardiovascular system: S1 & S2 +, regular, No JVD. Gastrointestinal system: Abdomen soft, NT,ND, BS+. Nervous System: Alert,awake, moving LE able to draw up leg both from the beds, weak on rt arm and left arm Extremities: No edema, distal peripheral pulses palpable.  Skin: No rashes,no icterus. MSK: Normal muscle bulk,tone, power  Data Reviewed: I have personally reviewed following labs and imaging studies CBC: Recent Labs  Lab 08/20/19 0352 08/20/19 0352 08/21/19 0001 08/21/19 0429 08/24/19 0048 08/25/19 0417 08/26/19 0011  WBC 3.6*   < > 5.4 4.8 5.1 4.2 4.3  NEUTROABS 3.0  --   --   --   --   --   --   HGB 11.7*   < > 11.4* 11.1* 12.1 11.8* 11.3*  HCT 35.4*   < > 34.5* 34.2* 37.3 35.9* 34.7*  MCV 91.2   < > 92.0 92.4 93.3 93.5 93.3  PLT 158   < > 173 158 176 155 169   < > = values in this interval not displayed.   Basic Metabolic Panel: Recent Labs  Lab 08/21/19 0429 08/22/19 0516 08/24/19 0048 08/25/19 0417 08/26/19 0011  NA 142 141 141 139 137  K 3.9 3.8 4.1 4.3 3.8  CL 112* 110 108 109 107  CO2 21* 23 23 20* 22  GLUCOSE 114* 125* 129* 119* 113*  BUN 9 10 11 14 13   CREATININE 0.85 0.82 0.90 0.89 0.90  CALCIUM 8.9 9.0 9.2 9.0 9.0  MG  --  1.9 1.9  --   --   PHOS  --   --  3.1  --   --    GFR: Estimated Creatinine Clearance: 61.1 mL/min (by C-G formula based on SCr of 0.9  mg/dL). Liver Function Tests: Recent Labs  Lab 08/24/19 0048 08/25/19 0417 08/26/19 0011  AST 29 35 37  ALT 23 23 26   ALKPHOS 58 53 56  BILITOT 0.7 1.0 0.5  PROT 7.2 6.5 6.6  ALBUMIN 2.7* 2.5* 2.6*   No results for input(s): LIPASE, AMYLASE in the last 168 hours. No results for input(s): AMMONIA in the last 168 hours. Coagulation Profile: No results for input(s): INR, PROTIME in the last 168 hours. Cardiac Enzymes: No results for input(s): CKTOTAL, CKMB, CKMBINDEX, TROPONINI in the last 168 hours. BNP (last 3 results) No results for input(s): PROBNP in the last 8760 hours. HbA1C: No results for input(s): HGBA1C in the last 72 hours. CBG: Recent Labs  Lab 08/25/19 2200 08/26/19 0606 08/26/19 0949 08/26/19 1137 08/26/19 1718  GLUCAP 96 116* 106* 105* 100*   Lipid Profile: No results for input(s): CHOL, HDL, LDLCALC, TRIG, CHOLHDL, LDLDIRECT in the last 72 hours. Thyroid Function Tests: No results for input(s): TSH, T4TOTAL, FREET4, T3FREE, THYROIDAB in the last 72 hours. Anemia Panel: No results for input(s): VITAMINB12, FOLATE, FERRITIN, TIBC, IRON, RETICCTPCT in the last 72 hours. Sepsis Labs: No results for input(s): PROCALCITON, LATICACIDVEN in the last 168 hours.  No results found for this or any previous visit (from the past 240 hour(s)).    Radiology Studies: No results found.   LOS: 30 days   Time spent: More than 50% of that time was spent in counseling and/or coordination of care.  Lynden Oxford, MD [facility]  08/26/2019, 8:31 PM

## 2019-08-27 LAB — CBC WITH DIFFERENTIAL/PLATELET
Abs Immature Granulocytes: 0.01 10*3/uL (ref 0.00–0.07)
Basophils Absolute: 0 10*3/uL (ref 0.0–0.1)
Basophils Relative: 1 %
Eosinophils Absolute: 0 10*3/uL (ref 0.0–0.5)
Eosinophils Relative: 1 %
HCT: 35.5 % — ABNORMAL LOW (ref 36.0–46.0)
Hemoglobin: 11.6 g/dL — ABNORMAL LOW (ref 12.0–15.0)
Immature Granulocytes: 0 %
Lymphocytes Relative: 20 %
Lymphs Abs: 0.9 10*3/uL (ref 0.7–4.0)
MCH: 30.8 pg (ref 26.0–34.0)
MCHC: 32.7 g/dL (ref 30.0–36.0)
MCV: 94.2 fL (ref 80.0–100.0)
Monocytes Absolute: 0.6 10*3/uL (ref 0.1–1.0)
Monocytes Relative: 14 %
Neutro Abs: 2.9 10*3/uL (ref 1.7–7.7)
Neutrophils Relative %: 64 %
Platelets: 170 10*3/uL (ref 150–400)
RBC: 3.77 MIL/uL — ABNORMAL LOW (ref 3.87–5.11)
RDW: 16.9 % — ABNORMAL HIGH (ref 11.5–15.5)
WBC: 4.5 10*3/uL (ref 4.0–10.5)
nRBC: 0 % (ref 0.0–0.2)

## 2019-08-27 LAB — COMPREHENSIVE METABOLIC PANEL
ALT: 27 U/L (ref 0–44)
AST: 35 U/L (ref 15–41)
Albumin: 2.6 g/dL — ABNORMAL LOW (ref 3.5–5.0)
Alkaline Phosphatase: 59 U/L (ref 38–126)
Anion gap: 9 (ref 5–15)
BUN: 14 mg/dL (ref 8–23)
CO2: 22 mmol/L (ref 22–32)
Calcium: 9.3 mg/dL (ref 8.9–10.3)
Chloride: 108 mmol/L (ref 98–111)
Creatinine, Ser: 0.93 mg/dL (ref 0.44–1.00)
GFR calc Af Amer: >60 mL/min (ref >60)
GFR calc non Af Amer: >60 mL/min (ref >60)
Glucose, Bld: 131 mg/dL — ABNORMAL HIGH (ref 70–99)
Potassium: 3.8 mmol/L (ref 3.5–5.1)
Sodium: 139 mmol/L (ref 135–145)
Total Bilirubin: 0.6 mg/dL (ref 0.3–1.2)
Total Protein: 7 g/dL (ref 6.5–8.1)

## 2019-08-27 LAB — MAGNESIUM: Magnesium: 2 mg/dL (ref 1.7–2.4)

## 2019-08-27 LAB — GLUCOSE, CAPILLARY
Glucose-Capillary: 120 mg/dL — ABNORMAL HIGH (ref 70–99)
Glucose-Capillary: 167 mg/dL — ABNORMAL HIGH (ref 70–99)
Glucose-Capillary: 95 mg/dL (ref 70–99)
Glucose-Capillary: 136 mg/dL — ABNORMAL HIGH (ref 70–99)

## 2019-08-27 MED ORDER — BOOST / RESOURCE BREEZE PO LIQD CUSTOM
1.0000 | Freq: Three times a day (TID) | ORAL | Status: DC
  Administered 2019-08-27 – 2019-09-04 (×21): 1 via ORAL

## 2019-08-27 MED ORDER — ATORVASTATIN CALCIUM 80 MG PO TABS
80.0000 mg | ORAL_TABLET | Freq: Every day | ORAL | Status: DC
  Administered 2019-08-27 – 2019-09-02 (×5): 80 mg via ORAL
  Filled 2019-08-27 (×5): qty 1

## 2019-08-27 NOTE — Progress Notes (Addendum)
Nutrition Follow-up  DOCUMENTATION CODES:   Non-severe (moderate) malnutrition in context of chronic illness  INTERVENTION:  Continue 1:1 feeding assistance Continue Ensure Enlive QID Boost Breeze po TID, each supplement provides 250 kcal and 9 grams of protein   Monitor for decision regarding comfort feeding.   NUTRITION DIAGNOSIS:   Moderate Malnutrition related to chronic illness(stroke) as evidenced by energy intake < 75% for > or equal to 1 month, percent weight loss, edema, mild fat depletion, mild muscle depletion.  Ongoing.  GOAL:   Patient will meet greater than or equal to 90% of their needs  Not met.   MONITOR:   PO intake, Supplement acceptance, Weight trends, Skin, Labs  REASON FOR ASSESSMENT:   Consult Calorie Count  ASSESSMENT:   Pt with a PMH significant for HTN, DM, HLD, CVA presented to ED with generalized weakness and fall.  Per Palliative Care, pt reaffirmed yesterday that she does not want a feeding tube. Pt expressed desire to improve PO intake and consumption of Ensure. Pt informed Palliative Care that she does not like Ensure when it is brought to her warm. RD will leave instructions for RNs to put it over ice if it isn't chilled in the refrigerator. Per Palliative Care, pt would like a few more days to improve her intake before continuing discussions of hospice care.   RN in room at time of RD visit today. RN and pt report intake slightly improved. Per RN, pt drank 100% of her Ensure this morning. RD will also order Boost Breeze with meals in hopes of increasing protein/calorie intake.  PO Intake: 0-25% x last 5 recorded meals (11% average meal intake)  Pt currently receiving Ensure Enlive QID.  Labs reviewed: CBGs 100-114-120 Medications reviewed and include: Marinol, Novolog, Remeron, Klor-con   Diet Order:   Diet Order            DIET - DYS 1 Room service appropriate? Yes; Fluid consistency: Thin  Diet effective now               EDUCATION NEEDS:   Not appropriate for education at this time  Skin:  Skin Assessment: Skin Integrity Issues: Skin Integrity Issues:: Other (Comment) Other: non-pressure wound L buttocks  Last BM:  4/19 type 6  Height:   Ht Readings from Last 1 Encounters:  08/01/19 5' 2"  (1.575 m)    Weight:   Wt Readings from Last 1 Encounters:  08/27/19 78.3 kg    BMI:  Body mass index is 31.57 kg/m.  Estimated Nutritional Needs:   Kcal:  5643-3295  Protein:  110-125 grams  Fluid:  >1.75 L   Larkin Ina, MS, RD, LDN RD pager number and weekend/on-call pager number located in [place].

## 2019-08-27 NOTE — Progress Notes (Signed)
Daily Progress Note   Patient Name: Denise Santiago       Date: 08/27/2019 DOB: April 29, 1956  Age: 64 y.o. MRN#: [id number] Attending Physician: Rolly Salter, MD Primary Care Physician: Renford Dills, MD Admit Date: 07/26/2019  Reason for Consultation/Follow-up: Establishing goals of care  Subjective: Patient awake and alert lying in bed. She is pointing to the wall and talking as if someone is there. When I make knowledge of my presence she ask me if I was coming to pick up the children and take them home. She begin stating different names. When asked who those names of people were she laughs and states I should know. She denied pain. She was able to appropriately state her name and date of birth, she states the year is 2057. She will follow some commands. Incontinent episode. Lunch tray at bedside. Patient has not eaten much from tray. RN reports she did drink 100% of Ensure and most of her Boost so far today.   Family originally expressed wishes to allow her a "few more days" to see if her appetite improves. No family is at the bedside. Attempts made to contact patient's sister however unable to reach. Voicemail has been left with hopes of being able to follow up with, Marcelino Duster (sister) and continue goals of care discussion.    Length of Stay: 31  Current Medications: Scheduled Meds:  . antiseptic oral rinse  15 mL Mouth Rinse BID  . apixaban  5 mg Oral BID  . atorvastatin  80 mg Oral q1800  . dronabinol  2.5 mg Oral BID AC  . feeding supplement  1 Container Oral TID WC  . feeding supplement (ENSURE ENLIVE)  237 mL Oral QID  . hydrALAZINE  25 mg Oral Q8H  . insulin aspart  0-15 Units Subcutaneous TID WC  . insulin aspart  0-5 Units Subcutaneous QHS  . latanoprost  1 drop Both Eyes  QHS  . lisinopril  20 mg Oral Daily  . metoprolol tartrate  25 mg Oral BID  . mirtazapine  15 mg Oral QHS  . nystatin  5 mL Oral QID  . potassium chloride  20 mEq Oral Daily  . sodium chloride flush  3 mL Intravenous Once    Continuous Infusions:   PRN Meds: acetaminophen **OR** acetaminophen, ondansetron **OR** ondansetron (ZOFRAN) IV,  vitamin A & D  Physical Exam   -awake and alert, NAD, chronically-ill appearing -RRR -normal respiratory efforts  -alert to self only, confusion with some hallucinations, follows simple commands         Vital Signs: BP (!) 144/74   Pulse 88   Temp 98.9 F (37.2 C) (Oral)   Resp 18   Ht 5\' 2"  (1.575 m)   Wt 78.3 kg   SpO2 100%   BMI 31.57 kg/m  SpO2: SpO2: 100 % O2 Device: O2 Device: Room Air O2 Flow Rate:    Intake/output summary:   Intake/Output Summary (Last 24 hours) at 08/27/2019 1337 Last data filed at 08/27/2019 1009 Gross per 24 hour  Intake 570 ml  Output 0 ml  Net 570 ml   LBM: Last BM Date: 08/24/19 Baseline Weight: Weight: 83.1 kg Most recent weight: Weight: 78.3 kg  Palliative Care Assessment & Plan   Recommendations/Plan:  Continue current plan of care per medical team  Patient originally request a "few more days" to see if her appetite improves. Unfortunately patient seems to be confused today and unable to engage in appropriate discussions. Sister unavailable for discussions.   PMT will continue to support and follow. Ongoing family discussions needed for clarification of goals.   Goals of Care and Additional Recommendations:  Limitations on Scope of Treatment: continue to treat the treatable, DNR   Prognosis:  Guarded to Poor   Discharge Planning:  To Be Determined  Care plan was discussed with RN.   Thank you for allowing the Palliative Medicine Team to assist in the care of this patient.  Time Total: 25 min.   Visit consisted of counseling and education dealing with the complex and emotionally  intense issues of symptom management and palliative care in the setting of serious and potentially life-threatening illness.Greater than 50%  of this time was spent counseling and coordinating care related to the above assessment and plan.  Alda Lea, AGPCNP-BC  Palliative Medicine Team [phone]  Please contact Palliative Medicine Team phone at [phone] for questions and concerns.

## 2019-08-27 NOTE — Progress Notes (Signed)
PROGRESS NOTE    Denise Santiago  OHY:[id number] DOB: 1955-11-08 DOA: 07/26/2019 PCP: Seward Carol, MD   Brief Narrative: 64 year old female with history of hypertension, diabetes, hyperlipidemia, CVA on 12/25/2018 and 03/27/2019-has a loop recorder, history of tobacco abuse presented to ED with generalized weakness pm 07/26/19.  Patient reported taking Covid vaccine on Saturday, has been feeling weak lethargic, slid out of her chair onto the floor and was not able to get up 1 day PTA.  EMS was called and brought to the ED as patient not able to get up from the floor.  Recently lost 50 pound weight since November since her last stroke, normally lives alone uses walker at home. Patient was found to have acute bilateral pulmonary embolism and ascending colon colitis.  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  Subjective: No nausea no vomiting no fever no chills.  More awake today.  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 Due to anticoagulation for PE treatment, not able to perform LP safely, Sp patient empirically completed 5 days of IVIG. Cont PT OT supportive care. ANA normal, CK nl. AntiGQ1b antibody pending  Failure to thrive with progressive weakness/moderate priotein-calorie malnutrition/decreased oral intake/weight loss: Multifactorial, continue supportive care dietitianPT OT following.  Off statin.  Nutrition following for calorie count, placed on Remeron to stimulate appetite.  48 hour calorie count over the  weekend demonstrates that the patient oral intake remains low. However, she does drink ensure without difficulty. Per nutrition if she is able to take 4 cans of ensure daily,she will need 80% of her nutritional needs. If she is not able to increase her nutritional intake, we will have to consider feeding tube. Psychiatry was consulted to address her anorexia and placed on Remeron.  Also on dronabinol. Po intake up to 25%. Only drinking half of her ensure.She reports she is eating some now.  Continue to augment nutrition, encourage patient.  Ascending colon colitis, since resolved, treated with 10 days of antibiotics X Rocephin, Zosyn then oral Augmentin, on Diflucan on March 29 and discontinued on April 4  Bilateral pulmonary embolism/left posterior tibial vein acute DVT: Patient had CT chest ordered for malignancy work-up and found to have bilateral PE incidentally noted was placed on IV heparin and subsequently switched to Eliquis.  Echo showed preserved EF 60 to 60% without regional wall motion abnormalities grade 1 diastolic dysfunction and no right heart strain.  AKI on CKD stage II resolved. 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  Hematuria resolved.  UA showed triple phosphate crystals urine culture Klebsiella and Proteus and repeat urine culture no growth  Thrombocytopenia acute on chronic-resolved current platelet in 150-170k  Urinary retention resolved Foley was placed on April 3 and has been removed.  Constipation:continue laxatives as needed.  Psychosis hallucination resolved.  Major depressive disorder-cont current meds  Essential hypertension. Added Lopressor.  Goals of care: Seen by palliative care completed MOST form patient is DNAR/DNI, treat  what is treatable, transition of care with outpatient palliative, plan for SNF  placement.   Weight loss patient had malignancy work-up with CT scan abdomen pelvis was found out PE, CA 19-9 AFP which are normal. encourage po.  Covid vaccination on March 13 second dose was  scheduled for a April 7  DVT prophylaxis:eliquis Code Status: DNR  Family Communication: plan of care discussed with patient at bedside. Status is: Inpatient  Remains inpatient appropriate because:Inpatient level of care appropriate due to severity of illness.  Difficult disposition due to uninsured,GBS with mild improvement and poor oral intake.  Dispo:The patient is from: Home            Anticipated d/c is to: SNF             Anticipated d/c date is: When bed available            Patient currently medically stable for discharge  Nutrition: Diet Order            DIET - DYS 1 Room service appropriate? Yes; Fluid consistency: Thin  Diet effective now              Nutrition Problem: Moderate Malnutrition Etiology: chronic illness(stroke) Signs/Symptoms: energy intake < 75% for > or equal to 1 month, percent weight loss, edema, mild fat depletion, mild muscle depletion Percent weight loss: 18 % Interventions: Ensure Enlive (each supplement provides 350kcal and 20 grams of protein), Magic cup, Calorie Count, Other (Comment)(1:1 feeding assistance) Body mass index is 31.57 kg/m.  Consultants: Neurology, dentistry, palliative care, psychiatry, cardiology Procedures:see note Microbiology:see note  Medications: Scheduled Meds: . antiseptic oral rinse  15 mL Mouth Rinse BID  . apixaban  5 mg Oral BID  . atorvastatin  80 mg Oral q1800  . dronabinol  2.5 mg Oral BID AC  . feeding supplement  1 Container Oral TID WC  . feeding supplement (ENSURE ENLIVE)  237 mL Oral QID  . hydrALAZINE  25 mg Oral Q8H  . insulin aspart  0-15 Units Subcutaneous TID WC  . insulin aspart  0-5 Units Subcutaneous QHS  . latanoprost  1 drop Both Eyes QHS  . lisinopril  20 mg Oral Daily  . metoprolol tartrate   25 mg Oral BID  . mirtazapine  15 mg Oral QHS  . nystatin  5 mL Oral QID  . potassium chloride  20 mEq Oral Daily  . sodium chloride flush  3 mL Intravenous Once   Continuous Infusions:  Antimicrobials: Anti-infectives (From admission, onward)   Start     Dose/Rate Route Frequency Ordered Stop   08/13/19 2330  piperacillin-tazobactam (ZOSYN) IVPB 3.375 g  Status:  Discontinued     3.375 g 12.5 mL/hr over 240 Minutes Intravenous Every 8 hours 08/13/19 1610 08/15/19 1249   08/13/19 1530  piperacillin-tazobactam (ZOSYN) IVPB 3.375 g  Status:  Discontinued     3.375 g 12.5 mL/hr over 240 Minutes Intravenous Every 8 hours 08/13/19 1412 08/13/19 1610   08/04/19 1015  amoxicillin-clavulanate (AUGMENTIN) 250-62.5 MG/5ML suspension 500 mg     500 mg Oral Every 12 hours 08/04/19 0844 08/08/19 2139   08/03/19 2000  fluconazole (DIFLUCAN) IVPB 100 mg  Status:  Discontinued     100 mg 50 mL/hr over 60 Minutes Intravenous Every 24 hours 08/03/19 1802 08/09/19 1813   08/01/19 1715  piperacillin-tazobactam (ZOSYN) IVPB 3.375 g  Status:  Discontinued     3.375 g 12.5 mL/hr over 240 Minutes Intravenous Every 8 hours 08/01/19  1712 08/04/19 0844   07/30/19 0000  cefTRIAXone (ROCEPHIN) 1 g in sodium chloride 0.9 % 100 mL IVPB  Status:  Discontinued     1 g 200 mL/hr over 30 Minutes Intravenous Every 24 hours 07/29/19 1420 07/29/19 1421   07/29/19 1421  cefTRIAXone (ROCEPHIN) 1 g in sodium chloride 0.9 % 100 mL IVPB  Status:  Discontinued     1 g 200 mL/hr over 30 Minutes Intravenous Every 24 hours 07/29/19 1421 08/01/19 0716       Objective: Vitals: Today's Vitals   08/27/19 1119 08/27/19 1124 08/27/19 1646 08/27/19 1959  BP: (!) 119/91 (!) 144/74 109/66 105/82  Pulse: 88  78 89  Resp: 18  18 17   Temp: 98.9 F (37.2 C)  99.3 F (37.4 C) 97.9 F (36.6 C)  TempSrc: Oral  Oral Oral  SpO2: 100%  100% 97%  Weight:      Height:      PainSc:        Intake/Output Summary (Last 24 hours) at  08/27/2019 2025 Last data filed at 08/27/2019 1009 Gross per 24 hour  Intake 330 ml  Output -  Net 330 ml   Filed Weights   08/25/19 0500 08/26/19 0500 08/27/19 0435  Weight: 77.1 kg 78.1 kg 78.3 kg   Weight change: 0.2 kg   Intake/Output from previous day: 04/21 0701 - 04/22 0700 In: 350 [P.O.:350] Out: 0  Intake/Output this shift: No intake/output data recorded.  Examination:  General exam: AAO x2-3,NAD, weak appearing. HEENT:Oral mucosa moist, Ear/Nose WNL grossly,dentition normal. Respiratory system: bilaterally clear, no use of accessory muscle, non tender. Cardiovascular system: S1 & S2 +, regular, No JVD. Gastrointestinal system: Abdomen soft, NT,ND, BS+. Nervous System: Alert,awake, moving LE able to draw up leg both from the beds, weak on rt arm and left arm Extremities: No edema, distal peripheral pulses palpable.  Skin: No rashes,no icterus. MSK: Normal muscle bulk,tone, power  Data Reviewed: I have personally reviewed following labs and imaging studies CBC: Recent Labs  Lab 08/21/19 0429 08/24/19 0048 08/25/19 0417 08/26/19 0011 08/27/19 0452  WBC 4.8 5.1 4.2 4.3 4.5  NEUTROABS  --   --   --   --  2.9  HGB 11.1* 12.1 11.8* 11.3* 11.6*  HCT 34.2* 37.3 35.9* 34.7* 35.5*  MCV 92.4 93.3 93.5 93.3 94.2  PLT 158 176 155 169 170   Basic Metabolic Panel: Recent Labs  Lab 08/22/19 0516 08/24/19 0048 08/25/19 0417 08/26/19 0011 08/27/19 0452  NA 141 141 139 137 139  K 3.8 4.1 4.3 3.8 3.8  CL 110 108 109 107 108  CO2 23 23 20* 22 22  GLUCOSE 125* 129* 119* 113* 131*  BUN 10 11 14 13 14   CREATININE 0.82 0.90 0.89 0.90 0.93  CALCIUM 9.0 9.2 9.0 9.0 9.3  MG 1.9 1.9  --   --  2.0  PHOS  --  3.1  --   --   --    GFR: Estimated Creatinine Clearance: 59.2 mL/min (by C-G formula based on SCr of 0.93 mg/dL). Liver Function Tests: Recent Labs  Lab 08/24/19 0048 08/25/19 0417 08/26/19 0011 08/27/19 0452  AST 29 35 37 35  ALT 23 23 26 27   ALKPHOS 58 53  56 59  BILITOT 0.7 1.0 0.5 0.6  PROT 7.2 6.5 6.6 7.0  ALBUMIN 2.7* 2.5* 2.6* 2.6*   No results for input(s): LIPASE, AMYLASE in the last 168 hours. No results for input(s): AMMONIA in the last 168  hours. Coagulation Profile: No results for input(s): INR, PROTIME in the last 168 hours. Cardiac Enzymes: No results for input(s): CKTOTAL, CKMB, CKMBINDEX, TROPONINI in the last 168 hours. BNP (last 3 results) No results for input(s): PROBNP in the last 8760 hours. HbA1C: No results for input(s): HGBA1C in the last 72 hours. CBG: Recent Labs  Lab 08/26/19 1718 08/26/19 2148 08/27/19 0611 08/27/19 1130 08/27/19 1655  GLUCAP 100* 114* 120* 167* 95   Lipid Profile: No results for input(s): CHOL, HDL, LDLCALC, TRIG, CHOLHDL, LDLDIRECT in the last 72 hours. Thyroid Function Tests: No results for input(s): TSH, T4TOTAL, FREET4, T3FREE, THYROIDAB in the last 72 hours. Anemia Panel: No results for input(s): VITAMINB12, FOLATE, FERRITIN, TIBC, IRON, RETICCTPCT in the last 72 hours. Sepsis Labs: No results for input(s): PROCALCITON, LATICACIDVEN in the last 168 hours.  No results found for this or any previous visit (from the past 240 hour(s)).    Radiology Studies: No results found.   LOS: 31 days   Time spent: More than 50% of that time was spent in counseling and/or coordination of care.  Lynden Oxford, MD [facility]  08/27/2019, 8:25 PM

## 2019-08-28 LAB — CBC
HCT: 35.6 % — ABNORMAL LOW (ref 36.0–46.0)
Hemoglobin: 11.5 g/dL — ABNORMAL LOW (ref 12.0–15.0)
MCH: 30.4 pg (ref 26.0–34.0)
MCHC: 32.3 g/dL (ref 30.0–36.0)
MCV: 94.2 fL (ref 80.0–100.0)
Platelets: 173 10*3/uL (ref 150–400)
RBC: 3.78 MIL/uL — ABNORMAL LOW (ref 3.87–5.11)
RDW: 17 % — ABNORMAL HIGH (ref 11.5–15.5)
WBC: 5.4 10*3/uL (ref 4.0–10.5)
nRBC: 0 % (ref 0.0–0.2)

## 2019-08-28 LAB — GLUCOSE, CAPILLARY
Glucose-Capillary: 124 mg/dL — ABNORMAL HIGH (ref 70–99)
Glucose-Capillary: 118 mg/dL — ABNORMAL HIGH (ref 70–99)
Glucose-Capillary: 112 mg/dL — ABNORMAL HIGH (ref 70–99)
Glucose-Capillary: 100 mg/dL — ABNORMAL HIGH (ref 70–99)
Glucose-Capillary: 122 mg/dL — ABNORMAL HIGH (ref 70–99)

## 2019-08-28 MED ORDER — POLYETHYLENE GLYCOL 3350 17 G PO PACK
17.0000 g | PACK | Freq: Every day | ORAL | Status: DC
  Administered 2019-08-28 – 2019-09-04 (×7): 17 g via ORAL
  Filled 2019-08-28 (×8): qty 1

## 2019-08-28 MED ORDER — SENNOSIDES-DOCUSATE SODIUM 8.6-50 MG PO TABS
1.0000 | ORAL_TABLET | Freq: Two times a day (BID) | ORAL | Status: AC
  Administered 2019-08-28 – 2019-08-30 (×3): 1 via ORAL
  Filled 2019-08-28 (×4): qty 1

## 2019-08-28 NOTE — Progress Notes (Signed)
Physical Therapy Treatment Patient Details Name: Denise Santiago MRN: [id number] DOB: 10-17-1955 Today's Date: 08/28/2019    History of Present Illness 64 y.o. female with a hx of DM, HTN, HLD, CVA 12/2018 & 03/2019, ongoing tob use who presented to ED with generalized weakness and elevated troponin, thought to be non specific by cardiology. pt reports weakness after COVID vaccine 07/16/19. The day prior to admission pt slid out of chair to floor and remained through the night.  Addendum 08/24/19: patient has lost more than 50 pounds since November after her last stroke. Malignancy work up: CT chest-acute bilateral pulmonary embolism; CT abd  ascending colon colitis. 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 could not due LP due to anticoagulation. 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      PT Comments    Upon arrival pt with eyes closed and with jerking movements of bilat UE and noted to hit herself in the face. Pt is oriented to self and at times with unintelligible speech. Pt continues to present with ataxia and poor proprioception and appears to be frightened by any positional change in bed. Cues needed to open eyes throughout session which pt will do inconsistently and only briefly. Pt positioned in midline with pillows as pt has tendency for L lateral bias even in supine. PT will continue to follow acutely.    Follow Up Recommendations  Supervision/Assistance - 24 hour;SNF     Equipment Recommendations  None recommended by PT    Recommendations for Other Services       Precautions / Restrictions Precautions Precautions: Fall Restrictions Weight Bearing Restrictions: No    Mobility  Bed Mobility Overal bed mobility: Needs Assistance Bed Mobility: Rolling Rolling: +2 for physical assistance;Total assist         General bed mobility comments: total A for all bed mobility  Transfers                     Ambulation/Gait                 Stairs             Wheelchair Mobility    Modified Rankin (Stroke Patients Only) Modified Rankin (Stroke Patients Only) Pre-Morbid Rankin Score: No significant disability Modified Rankin: Severe disability     Balance       Sitting balance - Comments: defered due to pt's mentation                                     Cognition Arousal/Alertness: Lethargic Behavior During Therapy: Flat affect Overall Cognitive Status: Impaired/Different from baseline Area of Impairment: Orientation;Attention;Memory;Following commands;Problem solving                 Orientation Level: Disoriented to;Place;Time;Situation Current Attention Level: Sustained Memory: Decreased short-term memory Following Commands: (pt not following commands)     Problem Solving: Slow processing;Decreased initiation;Difficulty sequencing;Requires verbal cues;Requires tactile cues General Comments: pt is oriented to self only and at times with unintelligible speech; tangential thoughts; upon arrival to room pt with bilat UE jerking movements and hit herself in the face       Exercises      General Comments        Pertinent Vitals/Pain Pain Assessment: Faces Faces Pain Scale: No hurt    Home Living  Prior Function            PT Goals (current goals can now be found in the care plan section) Progress towards PT goals: Not progressing toward goals - comment    Frequency    Min 3X/week      PT Plan Current plan remains appropriate    Co-evaluation              AM-PAC PT "6 Clicks" Mobility   Outcome Measure  Help needed turning from your back to your side while in a flat bed without using bedrails?: Total Help needed moving from lying on your back to sitting on the side of a flat bed without using bedrails?: Total Help needed moving to and from a bed to a chair (including a  wheelchair)?: Total Help needed standing up from a chair using your arms (e.g., wheelchair or bedside chair)?: Total Help needed to walk in hospital room?: Total Help needed climbing 3-5 steps with a railing? : Total 6 Click Score: 6    End of Session   Activity Tolerance: Patient tolerated treatment well Patient left: with call bell/phone within reach;in bed;with bed alarm set Nurse Communication: Mobility status PT Visit Diagnosis: Muscle weakness (generalized) (M62.81);Difficulty in walking, not elsewhere classified (R26.2)     Time: 2330-0762 PT Time Calculation (min) (ACUTE ONLY): 16 min  Charges:  $Therapeutic Activity: 8-22 mins                     Erline Levine, PTA Acute Rehabilitation Services Pager: [phone] Office: [phone]     Carolynne Edouard 08/28/2019, 1:52 PM

## 2019-08-28 NOTE — Progress Notes (Signed)
   Palliative Medicine Inpatient Follow Up Note   Today's Discussion (08/28/2019): Chart reviewed. Met with Denise Santiago at bedside she is quite confused today. Patient remains altered at the time of assessment. She thinks that we are "feeding the little boy" and remains to ask how the little boy is doing. I shared with her that there is no little boy in the room.   I asked Denise Santiago why she has not been eating as well. She states that she needs someone to help her with all meals. I told her that I would alert staff of this request. She does not vocalize a lack of appetite and does appear hungry. She enjoys her enjoy drinks.   Called patients sister, Denise Santiago to get collateral information and provide a brief update. She did not respond therefore a message was left.   Discussed the importance of continued conversation with family and their  medical providers regarding overall plan of care and treatment options, ensuring decisions are within the context of the patients values and GOCs.  Vital Signs Vitals:   08/28/19 1013 08/28/19 1306  BP: 140/73 (!) 130/107  Pulse: 87 77  Resp: 16 16  Temp: 98.2 F (36.8 C) 98.9 F (37.2 C)  SpO2: 100% 98%   No intake or output data in the 24 hours ending 08/28/19 1522 Last Weight  Most recent update: 08/27/2019  5:23 AM   Weight  78.3 kg (172 lb 9.9 oz)           Recommendations/Plan:  Continue current plan of care per medical team  Have asked nursing staff to help with meals to see if there is a greater percentage of meal consumption  PMT will continue to support and follow. Ongoing family discussions, will continue to reach out to patients sister to discuss current status.   Time Spent: 25 Greater than 50% of the time was spent in counseling and coordination of care ______________________________________________________________________________________ [place] Team Team Cell Phone: [phone] Please  utilize secure chat with additional questions, if there is no response within 30 minutes please call the above phone number  Palliative Medicine Team providers are available by phone from 7am to 7pm daily and can be reached through the team cell phone.  Should this patient require assistance outside of these hours, please call the patient's attending physician.

## 2019-08-28 NOTE — Progress Notes (Signed)
Patient having difficulty swallowing PO meds crushed in applesauce. Pt seems to be pocketing food. Provider notified and new orders placed to hold PO meds for tonight.

## 2019-08-28 NOTE — Progress Notes (Signed)
PROGRESS NOTE    Denise Santiago  ZJI:[id number] DOB: [birth year] DOA: 07/26/2019 PCP: Renford Dills, MD   Brief Narrative: 64 year old female with history of hypertension, diabetes, hyperlipidemia, CVA on 12/25/2018 and 03/27/2019-has a loop recorder, history of tobacco abuse presented to ED with generalized weakness pm 07/26/19.  Patient reported taking Covid vaccine on Saturday, has been feeling weak lethargic, slid out of her chair onto the floor and was not able to get up 1 day PTA.  EMS was called and brought to the ED as patient not able to get up from the floor.  Recently lost 50 pound weight since November since her last stroke, normally lives alone uses walker at home. Patient was found to have acute bilateral pulmonary embolism and ascending colon colitis.  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  Subjective: Denies any acute complaint.  No fever no chills.  No chest pain right now.  No diarrhea.  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 Due to anticoagulation for PE treatment, not able to perform LP safely, Sp patient empirically completed 5 days of IVIG. Cont PT OT supportive care. ANA normal, CK nl. AntiGQ1b antibody Equivocal  Failure to thrive with progressive weakness/moderate priotein-calorie malnutrition/decreased oral intake/weight loss: Multifactorial, continue supportive care dietitianPT OT following.  Off statin.  Nutrition following for calorie count, placed on Remeron to stimulate appetite.   48 hour calorie count over the weekend demonstrates that the patient oral intake remains low. However, she does drink ensure without difficulty. Per nutrition if she is able to take 4 cans of ensure daily,she will need 80% of her nutritional needs. If she is not able to increase her nutritional intake, we will have to consider feeding tube. Psychiatry was consulted to address her anorexia and placed on Remeron.  Also on dronabinol. Po intake up to 25%. Only drinking half of her ensure.She reports she is eating some now.  Continue to augment nutrition, encourage patient.  Ascending colon colitis, since resolved, treated with 10 days of antibiotics X Rocephin, Zosyn then oral Augmentin, on Diflucan on March 29 and discontinued on April 4  Bilateral pulmonary embolism/left posterior tibial vein acute DVT: Patient had CT chest ordered for malignancy work-up and found to have bilateral PE incidentally noted was placed on IV heparin and subsequently switched to Eliquis.  Echo showed preserved EF 60 to 60% without regional wall motion abnormalities grade 1 diastolic dysfunction and no right heart strain.  AKI on CKD stage II resolved. 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  Hematuria resolved.  UA showed triple phosphate crystals urine culture Klebsiella and Proteus and repeat urine culture no growth  Thrombocytopenia acute on chronic-resolved current platelet in 150-170k  Urinary retention resolved Foley was placed on April 3 and has been removed.  Constipation:continue laxatives as needed.  Psychosis hallucination resolved.  Major depressive disorder-cont current meds  Essential hypertension. Added Lopressor.  Goals of care: Seen by palliative care completed  MOST form patient is DNAR/DNI, treat what is treatable, transition of care with  outpatient palliative, plan for SNF placement.   Weight loss patient had malignancy work-up with CT scan abdomen pelvis was found out PE, CA 19-9 AFP which are normal. encourage po.  Covid vaccination on March 13 second dose was  scheduled for a April 7  DVT prophylaxis:eliquis Code Status: DNR  Family Communication: plan of care discussed with patient at bedside. Status is: Inpatient  Remains inpatient appropriate because:Inpatient level of care appropriate due to severity of illness.  Difficult disposition due to uninsured,GBS with mild improvement and poor oral intake.  Dispo:The patient is from: Home            Anticipated d/c is to: SNF             Anticipated d/c date is: When bed available            Patient currently medically stable for discharge  Nutrition: Diet Order            DIET - DYS 1 Room service appropriate? Yes; Fluid consistency: Thin  Diet effective now              Nutrition Problem: Moderate Malnutrition Etiology: chronic illness(stroke) Signs/Symptoms: energy intake < 75% for > or equal to 1 month, percent weight loss, edema, mild fat depletion, mild muscle depletion Percent weight loss: 18 % Interventions: Ensure Enlive (each supplement provides 350kcal and 20 grams of protein), Magic cup, Calorie Count, Other (Comment)(1:1 feeding assistance) Body mass index is 31.57 kg/m.  Consultants: Neurology, dentistry, palliative care, psychiatry, cardiology Procedures:see note Microbiology:see note  Medications: Scheduled Meds: . antiseptic oral rinse  15 mL Mouth Rinse BID  . apixaban  5 mg Oral BID  . atorvastatin  80 mg Oral q1800  . dronabinol  2.5 mg Oral BID AC  . feeding supplement  1 Container Oral TID WC  . feeding supplement (ENSURE ENLIVE)  237 mL Oral QID  . insulin aspart  0-15 Units Subcutaneous TID WC  . insulin aspart  0-5 Units Subcutaneous QHS  . latanoprost  1 drop Both Eyes QHS  . lisinopril  20 mg Oral Daily  . metoprolol  tartrate  25 mg Oral BID  . mirtazapine  15 mg Oral QHS  . nystatin  5 mL Oral QID  . polyethylene glycol  17 g Oral Daily  . potassium chloride  20 mEq Oral Daily  . senna-docusate  1 tablet Oral BID  . sodium chloride flush  3 mL Intravenous Once   Continuous Infusions:  Antimicrobials: Anti-infectives (From admission, onward)   Start     Dose/Rate Route Frequency Ordered Stop   08/13/19 2330  piperacillin-tazobactam (ZOSYN) IVPB 3.375 g  Status:  Discontinued     3.375 g 12.5 mL/hr over 240 Minutes Intravenous Every 8 hours 08/13/19 1610 08/15/19 1249   08/13/19 1530  piperacillin-tazobactam (ZOSYN) IVPB 3.375 g  Status:  Discontinued     3.375 g 12.5 mL/hr over 240 Minutes Intravenous Every 8 hours 08/13/19 1412 08/13/19 1610   08/04/19 1015  amoxicillin-clavulanate (AUGMENTIN) 250-62.5 MG/5ML suspension 500 mg     500 mg Oral Every 12 hours 08/04/19 0844 08/08/19 2139   08/03/19 2000  fluconazole (DIFLUCAN) IVPB 100 mg  Status:  Discontinued     100 mg 50 mL/hr over 60 Minutes Intravenous Every 24 hours 08/03/19 1802 08/09/19 1813   08/01/19 1715  piperacillin-tazobactam (ZOSYN) IVPB 3.375 g  Status:  Discontinued  3.375 g 12.5 mL/hr over 240 Minutes Intravenous Every 8 hours 08/01/19 1712 08/04/19 0844   07/30/19 0000  cefTRIAXone (ROCEPHIN) 1 g in sodium chloride 0.9 % 100 mL IVPB  Status:  Discontinued     1 g 200 mL/hr over 30 Minutes Intravenous Every 24 hours 07/29/19 1420 07/29/19 1421   07/29/19 1421  cefTRIAXone (ROCEPHIN) 1 g in sodium chloride 0.9 % 100 mL IVPB  Status:  Discontinued     1 g 200 mL/hr over 30 Minutes Intravenous Every 24 hours 07/29/19 1421 08/01/19 0716       Objective: Vitals: Today's Vitals   08/28/19 0828 08/28/19 1013 08/28/19 1306 08/28/19 1840  BP:  140/73 (!) 130/107 (!) 120/99  Pulse:  87 77 73  Resp:  16 16 16   Temp:  98.2 F (36.8 C) 98.9 F (37.2 C) 97.9 F (36.6 C)  TempSrc:  Oral Oral Oral  SpO2:  100% 98% 93%    Weight:      Height:      PainSc: 0-No pain       Intake/Output Summary (Last 24 hours) at 08/28/2019 1916 Last data filed at 08/28/2019 1851 Gross per 24 hour  Intake --  Output 200 ml  Net -200 ml   Filed Weights   08/25/19 0500 08/26/19 0500 08/27/19 0435  Weight: 77.1 kg 78.1 kg 78.3 kg   Weight change:    Intake/Output from previous day: 04/22 0701 - 04/23 0700 In: 330 [P.O.:330] Out: -  Intake/Output this shift: No intake/output data recorded.  Examination:  General exam: AAO x2-3,NAD, weak appearing. HEENT:Oral mucosa moist, Ear/Nose WNL grossly,dentition normal. Respiratory system: bilaterally clear, no use of accessory muscle, non tender. Cardiovascular system: S1 & S2 +, regular, No JVD. Gastrointestinal system: Abdomen soft, NT,ND, BS+. Nervous System: Alert,awake, moving LE able to draw up leg both from the beds, weak on rt arm and left arm Extremities: No edema, distal peripheral pulses palpable.  Skin: No rashes,no icterus. MSK: Normal muscle bulk,tone, power  Data Reviewed: I have personally reviewed following labs and imaging studies CBC: Recent Labs  Lab 08/24/19 0048 08/25/19 0417 08/26/19 0011 08/27/19 0452 08/28/19 0411  WBC 5.1 4.2 4.3 4.5 5.4  NEUTROABS  --   --   --  2.9  --   HGB 12.1 11.8* 11.3* 11.6* 11.5*  HCT 37.3 35.9* 34.7* 35.5* 35.6*  MCV 93.3 93.5 93.3 94.2 94.2  PLT 176 155 169 170 173   Basic Metabolic Panel: Recent Labs  Lab 08/22/19 0516 08/24/19 0048 08/25/19 0417 08/26/19 0011 08/27/19 0452  NA 141 141 139 137 139  K 3.8 4.1 4.3 3.8 3.8  CL 110 108 109 107 108  CO2 23 23 20* 22 22  GLUCOSE 125* 129* 119* 113* 131*  BUN 10 11 14 13 14   CREATININE 0.82 0.90 0.89 0.90 0.93  CALCIUM 9.0 9.2 9.0 9.0 9.3  MG 1.9 1.9  --   --  2.0  PHOS  --  3.1  --   --   --    GFR: Estimated Creatinine Clearance: 59.2 mL/min (by C-G formula based on SCr of 0.93 mg/dL). Liver Function Tests: Recent Labs  Lab 08/24/19 0048  08/25/19 0417 08/26/19 0011 08/27/19 0452  AST 29 35 37 35  ALT 23 23 26 27   ALKPHOS 58 53 56 59  BILITOT 0.7 1.0 0.5 0.6  PROT 7.2 6.5 6.6 7.0  ALBUMIN 2.7* 2.5* 2.6* 2.6*   No results for input(s): LIPASE, AMYLASE in  the last 168 hours. No results for input(s): AMMONIA in the last 168 hours. Coagulation Profile: No results for input(s): INR, PROTIME in the last 168 hours. Cardiac Enzymes: No results for input(s): CKTOTAL, CKMB, CKMBINDEX, TROPONINI in the last 168 hours. BNP (last 3 results) No results for input(s): PROBNP in the last 8760 hours. HbA1C: No results for input(s): HGBA1C in the last 72 hours. CBG: Recent Labs  Lab 08/27/19 2117 08/28/19 0614 08/28/19 1012 08/28/19 1305 08/28/19 1724  GLUCAP 136* 124* 118* 112* 100*   Lipid Profile: No results for input(s): CHOL, HDL, LDLCALC, TRIG, CHOLHDL, LDLDIRECT in the last 72 hours. Thyroid Function Tests: No results for input(s): TSH, T4TOTAL, FREET4, T3FREE, THYROIDAB in the last 72 hours. Anemia Panel: No results for input(s): VITAMINB12, FOLATE, FERRITIN, TIBC, IRON, RETICCTPCT in the last 72 hours. Sepsis Labs: No results for input(s): PROCALCITON, LATICACIDVEN in the last 168 hours.  No results found for this or any previous visit (from the past 240 hour(s)).    Radiology Studies: No results found.   LOS: 32 days   Time spent: More than 50% of that time was spent in counseling and/or coordination of care.  Berle Mull, MD [facility]  08/28/2019, 7:16 PM

## 2019-08-29 ENCOUNTER — Inpatient Hospital Stay (HOSPITAL_COMMUNITY): Payer: Self-pay

## 2019-08-29 LAB — CBC WITH DIFFERENTIAL/PLATELET
Abs Immature Granulocytes: 0.01 10*3/uL (ref 0.00–0.07)
Basophils Absolute: 0 10*3/uL (ref 0.0–0.1)
Basophils Relative: 1 %
Eosinophils Absolute: 0 10*3/uL (ref 0.0–0.5)
Eosinophils Relative: 1 %
HCT: 37.2 % (ref 36.0–46.0)
Hemoglobin: 12.8 g/dL (ref 12.0–15.0)
Immature Granulocytes: 0 %
Lymphocytes Relative: 16 %
Lymphs Abs: 0.9 10*3/uL (ref 0.7–4.0)
MCH: 32.8 pg (ref 26.0–34.0)
MCHC: 34.4 g/dL (ref 30.0–36.0)
MCV: 95.4 fL (ref 80.0–100.0)
Monocytes Absolute: 0.8 10*3/uL (ref 0.1–1.0)
Monocytes Relative: 13 %
Neutro Abs: 4 10*3/uL (ref 1.7–7.7)
Neutrophils Relative %: 69 %
Platelets: 189 10*3/uL (ref 150–400)
RBC: 3.9 MIL/uL (ref 3.87–5.11)
RDW: 17.1 % — ABNORMAL HIGH (ref 11.5–15.5)
WBC: 5.7 10*3/uL (ref 4.0–10.5)
nRBC: 0 % (ref 0.0–0.2)

## 2019-08-29 LAB — COMPREHENSIVE METABOLIC PANEL
ALT: 25 U/L (ref 0–44)
AST: 28 U/L (ref 15–41)
Albumin: 2.8 g/dL — ABNORMAL LOW (ref 3.5–5.0)
Alkaline Phosphatase: 59 U/L (ref 38–126)
Anion gap: 9 (ref 5–15)
BUN: 23 mg/dL (ref 8–23)
CO2: 23 mmol/L (ref 22–32)
Calcium: 9.5 mg/dL (ref 8.9–10.3)
Chloride: 109 mmol/L (ref 98–111)
Creatinine, Ser: 1 mg/dL (ref 0.44–1.00)
GFR calc Af Amer: >60 mL/min (ref >60)
GFR calc non Af Amer: 59 mL/min — ABNORMAL LOW (ref >60)
Glucose, Bld: 129 mg/dL — ABNORMAL HIGH (ref 70–99)
Potassium: 3.8 mmol/L (ref 3.5–5.1)
Sodium: 141 mmol/L (ref 135–145)
Total Bilirubin: 0.9 mg/dL (ref 0.3–1.2)
Total Protein: 7.4 g/dL (ref 6.5–8.1)

## 2019-08-29 LAB — TSH: TSH: 2.735 u[IU]/mL (ref 0.350–4.500)

## 2019-08-29 LAB — GLUCOSE, CAPILLARY
Glucose-Capillary: 106 mg/dL — ABNORMAL HIGH (ref 70–99)
Glucose-Capillary: 125 mg/dL — ABNORMAL HIGH (ref 70–99)
Glucose-Capillary: 140 mg/dL — ABNORMAL HIGH (ref 70–99)
Glucose-Capillary: 120 mg/dL — ABNORMAL HIGH (ref 70–99)

## 2019-08-29 LAB — AMMONIA: Ammonia: 48 umol/L — ABNORMAL HIGH (ref 9–35)

## 2019-08-29 LAB — VITAMIN B12: Vitamin B-12: 352 pg/mL (ref 180–914)

## 2019-08-29 LAB — IRON AND TIBC
Iron: 40 ug/dL (ref 28–170)
Saturation Ratios: 19 % (ref 10.4–31.8)
TIBC: 213 ug/dL — ABNORMAL LOW (ref 250–450)
UIBC: 173 ug/dL

## 2019-08-29 LAB — T4, FREE: Free T4: 1.31 ng/dL — ABNORMAL HIGH (ref 0.61–1.12)

## 2019-08-29 LAB — SEDIMENTATION RATE: Sed Rate: 87 mm/hr — ABNORMAL HIGH (ref 0–22)

## 2019-08-29 LAB — C-REACTIVE PROTEIN: CRP: 1.3 mg/dL — ABNORMAL HIGH (ref <1.0)

## 2019-08-29 LAB — FERRITIN: Ferritin: 983 ng/mL — ABNORMAL HIGH (ref 11–307)

## 2019-08-29 LAB — PREALBUMIN: Prealbumin: 11.3 mg/dL — ABNORMAL LOW (ref 18–38)

## 2019-08-29 MED ORDER — THIAMINE HCL 100 MG/ML IJ SOLN
500.0000 mg | INTRAVENOUS | Status: AC
  Administered 2019-08-29 – 2019-08-31 (×3): 500 mg via INTRAVENOUS
  Filled 2019-08-29 (×3): qty 5

## 2019-08-29 MED ORDER — SERTRALINE HCL 50 MG PO TABS
25.0000 mg | ORAL_TABLET | Freq: Every day | ORAL | Status: DC
  Administered 2019-08-30 – 2019-09-04 (×6): 25 mg via ORAL
  Filled 2019-08-29 (×6): qty 1

## 2019-08-29 MED ORDER — FOLIC ACID 5 MG/ML IJ SOLN
1.0000 mg | Freq: Every day | INTRAMUSCULAR | Status: AC
  Administered 2019-08-30 – 2019-08-31 (×2): 1 mg via INTRAVENOUS
  Filled 2019-08-29 (×3): qty 0.2

## 2019-08-29 MED ORDER — CYANOCOBALAMIN 1000 MCG/ML IJ SOLN
1000.0000 ug | Freq: Every day | INTRAMUSCULAR | Status: AC
  Administered 2019-08-29 – 2019-08-31 (×3): 1000 ug via SUBCUTANEOUS
  Filled 2019-08-29 (×3): qty 1

## 2019-08-29 MED ORDER — OLANZAPINE 2.5 MG PO TABS
5.0000 mg | ORAL_TABLET | Freq: Every day | ORAL | Status: DC
  Administered 2019-08-31 – 2019-09-03 (×4): 5 mg via ORAL
  Filled 2019-08-29 (×4): qty 2

## 2019-08-29 MED ORDER — LACTULOSE 10 GM/15ML PO SOLN
20.0000 g | Freq: Two times a day (BID) | ORAL | Status: DC
  Administered 2019-08-30 – 2019-09-04 (×10): 20 g via ORAL
  Filled 2019-08-29 (×10): qty 30

## 2019-08-29 NOTE — Evaluation (Signed)
Clinical/Bedside Swallow Evaluation Patient Details  Name: Denise Santiago MRN: [id number] Date of Birth: Jul 05, 1955  Today's Date: 08/29/2019 Time: SLP Start Time (ACUTE ONLY): 1400 SLP Stop Time (ACUTE ONLY): 1420 SLP Time Calculation (min) (ACUTE ONLY): 20 min  Past Medical History:  Past Medical History:  Diagnosis Date  . CVA (cerebral vascular accident) (HCC) 12/2018   And 03/2019, loop recorder inserted 03/2019  . Diabetes mellitus without complication (HCC)   . Hyperlipidemia   . Hypertension   . Obesity    Past Surgical History:  Past Surgical History:  Procedure Laterality Date  . ABDOMINAL HYSTERECTOMY    . BUBBLE STUDY  03/20/2019   Procedure: BUBBLE STUDY;  Surgeon: Wendall Stade, MD;  Location: [facility] ENDOSCOPY;  Service: Cardiovascular;;  . LOOP RECORDER INSERTION N/A 03/20/2019   Procedure: LOOP RECORDER INSERTION;  Surgeon: Regan Lemming, MD;  Location: [facility] INVASIVE CV LAB;  Service: Cardiovascular;  Laterality: N/A;  . TEE WITHOUT CARDIOVERSION N/A 03/20/2019   Procedure: TRANSESOPHAGEAL ECHOCARDIOGRAM (TEE);  Surgeon: Wendall Stade, MD;  Location[facility] END[facility]COPY;  Service: Cardiovascular;  Laterality: N/A;   HPI:  64 year old woman with prior history of hypertension, diabetes, hyperlipidemia, CVA presents to ED for generalized weakness and fall.  Probably secondary to electrolyte abnormalities per MD. MRI shows chronic cortically based infarct within the posterior right cerebral hemisphere predominantly affecting the right temporal and occipital lobes and with minimal involvement of the adjacent right parietal lobe. Redemonstrated chronic cortically based infarct within right frontal operculum. Stable background chronic small vessel ischemic disease with chronic lacunar infarcts in the right corona radiata and bilateral basal ganglia.   Assessment / Plan / Recommendation Clinical Impression  Pt accepting of minimal po including water via straw and  Ensure/Icecream and Gingerale mix via straw.  She winced with swallow of water admitting to displeasure with taste- denies pain with swallowing.  Pt previously treated with Diflucan per chart.  *Note pt with gustatory symptoms during initial evaluation four weeks ago when she was able to functionally articulate needs, etc.  Pt with mild delay in swallowing even with liquids - No indications of pharyngeal deficits.  Dysphagia is cognitive/?delirium based resulting in oral stasis/holding.  No focal CN deficits from OME instructions pt able to follow.  When she speaks, she is able to articulate clearly. Recommend to maximize liquid nutrition as this is the most efficient and safest.  Pt likes chocolate - thus encourage Ensure with icecream/gingerale mixture.  Question source of pt's gustatory issues, as pt's intake has been poor since last fall with significant weight loss even prior to this admit.  Session abbreviated as palliative team member in room with pt upon SLP return.  Unfortunately, unless pt's cognitive oral dysphagia improves, she will be unable to meet her nutritional needs.   Will follow up briefly for family education and trials of advanced po with variable taste/textures. Sister present for a large portion of evaluation and was educated to recommendations. Pt has dentures but they are at home, will ask sister to bring for pt to see if will assist with solid manipulation. SLP Visit Diagnosis: Dysphagia, oral phase (R13.11)    Aspiration Risk  Moderate aspiration risk;Mild aspiration risk    Diet Recommendation Thin liquid(full liquids/purees)   Medication Administration: Crushed with puree Supervision: Staff to assist with self feeding Compensations: Slow rate;Small sips/bites;Follow solids with liquid Postural Changes: Seated upright at 90 degrees;Remain upright for at least 30 minutes after po intake    Other  Recommendations  Oral Care Recommendations: Oral care BID   Follow up  Recommendations 24 hour supervision/assistance      Frequency and Duration min 1 x/week  1 week       Prognosis        Swallow Study   General Date of Onset: 08/29/19 HPI: 64 year old woman with prior history of hypertension, diabetes, hyperlipidemia, CVA presents to ED for generalized weakness and fall.  Probably secondary to electrolyte abnormalities per MD. MRI shows chronic cortically based infarct within the posterior right cerebral hemisphere predominantly affecting the right temporal and occipital lobes and with minimal involvement of the adjacent right parietal lobe. Redemonstrated chronic cortically based infarct within right frontal operculum. Stable background chronic small vessel ischemic disease with chronic lacunar infarcts in the right corona radiata and bilateral basal ganglia. Type of Study: Bedside Swallow Evaluation Diet Prior to this Study: Thin liquids;Dysphagia 1 (puree) Temperature Spikes Noted: No Respiratory Status: Room air History of Recent Intubation: No Behavior/Cognition: Alert;Distractible;Requires cueing;Doesn't follow directions Oral Cavity Assessment: Within Functional Limits Oral Care Completed by SLP: No Oral Cavity - Dentition: Missing dentition(has upper denture at home) Vision: Impaired for self-feeding Self-Feeding Abilities: Total assist Patient Positioning: Upright in bed Baseline Vocal Quality: Normal Volitional Cough: Strong Volitional Swallow: Unable to elicit    Oral/Motor/Sensory Function Overall Oral Motor/Sensory Function: Generalized oral weakness(severe underbite, some weakness to seal lips on straw, did not follow directions consistently, articulation clear)   Ice Chips Ice chips: Not tested   Thin Liquid Thin Liquid: Impaired Presentation: Straw Oral Phase Functional Implications: Prolonged oral transit(clinical appearance of prolonged oral transit) Pharyngeal  Phase Impairments: Suspected delayed Swallow;Throat Clearing -  Delayed    Nectar Thick Nectar Thick Liquid: Impaired Presentation: Straw Oral phase functional implications: Prolonged oral transit(clinical appearance of prolonged oral transit) Pharyngeal Phase Impairments: Suspected delayed Swallow   Honey Thick Honey Thick Liquid: Not tested   Puree Puree: Not tested Other Comments: session abbreviated   Solid     Solid: Not tested Other Comments: pt orally pocketing pills - and with delayed oral transit with liquids, without upper dentures and lack of cognitive ability to effectively masticate      Macario Golds 08/29/2019,3:15 PM  Kathleen Lime, MS [place] Office [phone]

## 2019-08-29 NOTE — Progress Notes (Signed)
Attempted to feed pt small bites of applesauce prior to medication administration. Pt held food on tongue and pocketed in gums. Pt cued to swallow multiple times but unable to. Encouraged sips of ensure, but pt refused. Paged on-call provider and received orders to hold PO meds for tonight.

## 2019-08-29 NOTE — Plan of Care (Signed)
  Problem: Education: Goal: Knowledge of General Education information will improve Description: Including pain rating scale, medication(s)/side effects and non-pharmacologic comfort measures Outcome: Progressing   Problem: Clinical Measurements: Goal: Will remain free from infection Outcome: Progressing Goal: Diagnostic test results will improve Outcome: Progressing Goal: Respiratory complications will improve Outcome: Progressing Goal: Cardiovascular complication will be avoided Outcome: Progressing   Problem: Activity: Goal: Risk for activity intolerance will decrease Outcome: Progressing   Problem: Nutrition: Goal: Adequate nutrition will be maintained Outcome: Progressing   Problem: Coping: Goal: Level of anxiety will decrease Outcome: Progressing   Problem: Elimination: Goal: Will not experience complications related to bowel motility Outcome: Progressing Goal: Will not experience complications related to urinary retention Outcome: Progressing   Problem: Pain Managment: Goal: General experience of comfort will improve Outcome: Progressing   Problem: Safety: Goal: Ability to remain free from injury will improve Outcome: Progressing   Problem: Skin Integrity: Goal: Risk for impaired skin integrity will decrease Outcome: Progressing Patient has refused to swallow any nourishment, or medication. Family encourage patient to eat as well.

## 2019-08-29 NOTE — Progress Notes (Signed)
[facility] Progress Note  Patient: Denise Santiago    OXB:[id number]  DOA: 07/26/2019     Date of Service: the patient was seen and examined on 08/29/2019  Chief Complaint  Patient presents with  . Weakness   Brief hospital course: 64 year old female with history of hypertension, diabetes, hyperlipidemia, CVA on 12/25/2018 and 03/27/2019-has a loop recorder, history of tobacco abuse presented to ED with generalized weakness pm 07/26/19.  Patient reported taking Covid vaccine on Saturday, has been feeling weak lethargic, slid out of her chair onto the floor and was not able to get up 1 day PTA.  EMS was called and brought to the ED as patient not able to get up from the floor.  Recently lost 50 pound weight since November since her last stroke, normally lives alone uses walker at home. Patient was found to have acute bilateral pulmonary embolism and ascending colon colitis.  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  Currently further plan is work-up for reversible cause for encephalopathy and consider goals of care engagement with the family.  Assessment and Plan: 1.  GBS Miller Fisher variant Presents with generalized weakness and deconditioning. Patient has poor p.o. intake for 6 months prior to admission. Per sister on the day of patient's Covid vaccine shot patient had a fall and she took an hour to get up from the floor when she went there to take her to the vaccine site. Patient has progressive weakness ongoing from that day. MRI have and spine negative for any acute abnormality. 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 Empirically completed 5 days of IVIG. Due to therapeutic anticoagulation unable to perform LP. ANA normal.   CK normal.  Anti-GQ 1B antibody equivocal in range.  2.  Failure to thrive. Vitamin B12 deficiency. Moderate protein calorie malnutrition.  Patient appears to have progressive failure to thrive at home which continues to worsen. Calorie count demonstrates patient oral intake remains low. If the patient drinks 4 Ensure she was met 80% of her attritional need but she is now pocketing her food. Started on Remeron as well as dronabinol. Currently appears more encephalopathic therefore we will discontinue Demerol and continue Marinol. Monitor.  3.  Acute metabolic encephalopathy Multifactorial in nature. Check B1, B12, TSH, free T4, RPR, EEG. Repeat CT scan as there is an acute change in patient's mental status. We will treat empirically with high-dose IV thiamine and subcu B12. If no improvement in 48 hours no indication to continue this medication further.  4.  Ascending colon colitis Treated with 10 days of IV Rocephin initially was on Zosyn and then Augmentin. Treatment completed.  5.  Bilateral pulmonary embolism. Left leg acute DVT With concern for malignancy work-up was initiated which was positive for bilateral PE. Treated with heparin and subsequently switched to Eliquis. Echocardiogram shows preserved EF no wall motion abnormality no right heart strain.  6.  Acute kidney injury on chronic kidney disease stage II. Renal function relatively stable for now. We will monitor.  At risk for dehydration leading to AKI.  7.  Major depressive disorder. Psychosis. Continue to monitor for now. Psychiatry consulted on 08/19/2019.  Recommend to continue Remeron. Resume once encephalopathy improves.  8.  History of recurrent CVA  MRI on March 25 shows  no acute finding. Continue Plavix statin and Eliquis.  9.  Thrombocytopenia Platelets currently stable.  Monitor.  10 urinary retention currently resolved Foley catheter removed and patient is able to void  11.  Goals of care  discussion Palliative care consulted. Patient prognosis remains guarded given her failure to thrive and failure to respond to aggressive therapy with ongoing weakness and confusion. PT OT consulted currently recommends SNF although patient's mentation waxes and wanes With moderate protein calorie malnutrition patient remains at risk for poor outcome and progressive decline. Do not think that the patient is a candidate for PEG tube placement.  Body mass index is 29.64 kg/m.  Nutrition Problem: Moderate Malnutrition Etiology: chronic illness(stroke) Interventions: Interventions: Ensure Enlive (each supplement provides 350kcal and 20 grams of protein), Magic cup, Calorie Count, Other (Comment)(1:1 feeding assistance)      Diet: Cardiac diet dysphagia 1 DVT Prophylaxis: Therapeutic Anticoagulation with Eliquis   Advance goals of care discussion: DNR  Family Communication: no family was present at bedside, at the time of interview.  Discussed with sister on the phone.  Disposition:  Status is: Inpatient  Remains inpatient appropriate because:Unsafe d/c plan   Dispo: The patient is from: Home              Anticipated d/c is to: SNF              Anticipated d/c date is: > 3 days              Patient currently is not medically stable to d/c.  Subjective: More confused.  Tells me that she is in the charge.  No nausea no vomiting.  No fever no chills.  No acute events overnight.  Physical Exam: General:  lethargic not oriented to time, place, and person.  Appear in moderate distress, affect flat in affect Eyes: PERRL ENT: Oral Mucosa Clear, dry  Neck: difficult to assess  JVD,  Cardiovascular: S1 and S2 Present, no Murmur,  Respiratory: good respiratory effort, Bilateral Air entry equal and Decreased, no Crackles, no wheezes Abdomen: Bowel Sound present, Soft and no tenderness,  Skin: no rash Extremities: no Pedal edema, no calf tenderness Neurologic: Left-sided weakness.  More  on lower extremities  Gait not checked due to patient safety concerns  Vitals:   08/29/19 0334 08/29/19 1007 08/29/19 1156 08/29/19 1629  BP: (!) 158/80 (!) 143/72 134/63 (!) 124/55  Pulse: 86 87 79   Resp: _0 Temp: 98 F (36.7 C)  98.3 F (36.8 C) 98.5 F (36.9 C)  TempSrc: Oral  Oral Axillary  SpO2: 92%  98% 96%  Weight:      Height:        Intake/Output Summary (Last 24 hours) at 08/29/2019 1720 Last data filed at 08/29/2019 1640 Gross per 24 hour  Intake 115 ml  Output 200 ml  Net -85 ml   Filed Weights   08/26/19 0500 08/27/19 0435 08/29/19 0316  Weight: 78.1 kg 78.3 kg 73.5 kg    Data Reviewed: I have personally reviewed and interpreted daily labs, tele strips, imagings as discussed above. I reviewed all nursing notes, pharmacy notes, vitals, pertinent old records I have discussed plan of care as described above with RN and patient/family.  CBC: Recent Labs  Lab 08/25/19 0417 08/26/19 0011 08/27/19 0452 08/28/19 0411 08/29/19 1309  WBC 4.2 4.3 4.5 5.4 5.7  NEUTROABS  --   --  2.9  --  4.0  HGB 11.8* 11.3* 11.6* 11.5* 12.8  HCT 35.9* 34.7* 35.5* 35.6* 37.2  MCV 93.5 93.3 94.2 94.2 95.4  PLT 155 169 170 173 254   Basic Metabolic Panel: Recent Labs  Lab 08/24/19 0048 08/25/19 0417 08/26/19 0011 08/27/19 0452 08/29/19 1309  NA 141 139 137 139 141  K 4.1 4.3 3.8 3.8 3.8  CL 108 109 107 108 109  CO2 23 20* _0 GLUCOSE 129* 119* 113* 131* 129*  BUN _1 CREATININE 0.90 0.89 0.90 0.93 1.00  CALCIUM 9.2 9.0 9.0 9.3 9.5  MG 1.9  --   --  2.0  --   PHOS 3.1  --   --   --   --     Studies: No results found.  Scheduled Meds: . antiseptic oral rinse  15 mL Mouth Rinse BID  . apixaban  5 mg Oral BID  . atorvastatin  80 mg Oral q1800  . cyanocobalamin  1,000 mcg Subcutaneous Daily  . dronabinol  2.5 mg Oral BID AC  . feeding supplement  1 Container Oral TID WC  . feeding supplement (ENSURE ENLIVE)  237 mL Oral QID  . folic  acid  1 mg Intravenous Daily  . insulin aspart  0-15 Units Subcutaneous TID WC  . insulin aspart  0-5 Units Subcutaneous QHS  . lactulose  20 g Oral BID  . latanoprost  1 drop Both Eyes QHS  . lisinopril  20 mg Oral Daily  . metoprolol tartrate  25 mg Oral BID  . nystatin  5 mL Oral QID  . polyethylene glycol  17 g Oral Daily  . potassium chloride  20 mEq Oral Daily  . senna-docusate  1 tablet Oral BID  . sodium chloride flush  3 mL Intravenous Once   Continuous Infusions: . thiamine injection 500 mg (08/29/19 1434)   PRN Meds: acetaminophen **OR** acetaminophen, ondansetron **OR** ondansetron (ZOFRAN) IV, vitamin A & D  Time spent: 35 minutes  Author: Berle Mull, MD [facility] 08/29/2019 5:20 PM  To reach On-call, see care teams to locate the attending and reach out to them via [email or web address]. If 7PM-7AM, please contact night-coverage If you still have difficulty reaching the attending provider, please page the [facility] (Director on Call) for [facility] on amion for assistance.

## 2019-08-29 NOTE — Progress Notes (Signed)
   Palliative Medicine Inpatient Follow Up Note   Today's Discussion (08/29/2019): Chart reviewed. Patient remains altered upon assessment.    Spoke to nursing staff who endorse that Denise Santiago remains to have poor oral intake and now seems to be pocketing food. A swallow evaluation has been placed.   Called patients sister, Denise Santiago she said that she is aware of Denise Santiago's poor oral intake. She believes having family present may increase the patients willingness to eat and drink. Shared concerns that if her oral intake remains poor that is often worrisome from a whole body perspective. Denise Santiago assures that she will have family come to bedside to see if this enhances her intake.  From a discharge perspective still awaiting insurance.  Discussed the importance of continued conversation with family and their  medical providers regarding overall plan of care and treatment options, ensuring decisions are within the context of the patients values and GOCs.  Vital Signs Vitals:   08/29/19 0334 08/29/19 1007  BP: (!) 158/80 (!) 143/72  Pulse: 86 87  Resp: 18   Temp: 98 F (36.7 C)   SpO2: 92%     Intake/Output Summary (Last 24 hours) at 08/29/2019 1156 Last data filed at 08/28/2019 1851 Gross per 24 hour  Intake -  Output 200 ml  Net -200 ml   Last Weight  Most recent update: 08/29/2019  3:16 AM   Weight  73.5 kg (162 lb 0.6 oz)           Recommendations/Plan:  Continue current plan of care per medical team  Family to help with meals   Swallow Evaluation ordered  Continue to encourage supplementals  PMT will continue to support and follow. Ongoing family discussions, will continue to reach out to patients sister to discuss current status.   Time Spent: 25 Greater than 50% of the time was spent in counseling and coordination of care ______________________________________________________________________________________ Lamarr Lulas [facility] Palliative Medicine Team  Team Cell Phone: [phone] Please utilize secure chat with additional questions, if there is no response within 30 minutes please call the above phone number  Palliative Medicine Team providers are available by phone from 7am to 7pm daily and can be reached through the team cell phone.  Should this patient require assistance outside of these hours, please call the patient's attending physician.

## 2019-08-30 ENCOUNTER — Inpatient Hospital Stay (HOSPITAL_COMMUNITY): Payer: Self-pay

## 2019-08-30 DIAGNOSIS — R4182 Altered mental status, unspecified: Secondary | ICD-10-CM

## 2019-08-30 LAB — GLUCOSE, CAPILLARY
Glucose-Capillary: 131 mg/dL — ABNORMAL HIGH (ref 70–99)
Glucose-Capillary: 117 mg/dL — ABNORMAL HIGH (ref 70–99)
Glucose-Capillary: 111 mg/dL — ABNORMAL HIGH (ref 70–99)
Glucose-Capillary: 109 mg/dL — ABNORMAL HIGH (ref 70–99)

## 2019-08-30 NOTE — Procedures (Addendum)
Patient Name: Marjani Kobel  MRN: [id number]  Epilepsy Attending: Charlsie Quest  Referring Physician/Provider: Dr Lynden Oxford Date: 08/30/2019 Duration: 23.27 mins  Patient history: 64yo F with ams. EEG to evaluate for seizure.   Level of alertness: awake  AEDs during EEG study: None  Technical aspects: This EEG study was done with scalp electrodes positioned according to the 10-20 International system of electrode placement. Electrical activity was acquired at a sampling rate of 500Hz  and reviewed with a high frequency filter of 70Hz  and a low frequency filter of 1Hz . EEG data were recorded continuously and digitally stored.   DESCRIPTION:  No clear posterior dominant rhythm was seen. EEG showed continuous generalized and maximal right frontotemporal region 5-7hz  theta-delta slowing. Hyperventilation and photic stimulation were not performed.  ABNORMALITY - Continuous slow, generalized and maximal right frontotemporal region  IMPRESSION: This study is suggestive of non specific cortical dysfunction in right temporal region likely secondary to underlying encephalomalacia as well as mild to moderate diffuse encephalopathy, non specific to etiology.  No seizures or epileptiform discharges were seen throughout the recording.  Darneshia Demary 

## 2019-08-30 NOTE — Progress Notes (Signed)
Attempted to feed pt chocolate pudding and breeze drink. Pt holding pudding on tongue and unable to swallow. Pt encouraged to sip from straw, but pt refused. On-call provider notified, received orders to hold PO meds for tonight.

## 2019-08-30 NOTE — Progress Notes (Signed)
   Palliative Medicine Inpatient Follow Up Note   Today's Discussion (08/30/2019): Chart reviewed. Patient remains altered upon assessment this afternoon. Per discussion with nursing staff she cannot comprehend how to swallow food.   I called patients sister after conversing with the medical team. Some additional diagnostics are being run at the present time to identify if there are any obvious reversible causes to patients encephalopathic state. Denise Santiago shared that she feels that we have reached the end of the road for Denise Santiago and that her outlook is not good. She recounts a variety of bizarre activities that lead her to think Denise Santiago knew her time was short. She shares that she wishes the situation were different but that she plans on coming in as often as able to offer her sister support.   In the event that Denise Santiago continues in her present state and no reversible causes are identified her sister, Denise Santiago is open to residential hospice placement.   Discussed the importance of continued conversation with family and their  medical providers regarding overall plan of care and treatment options, ensuring decisions are within the context of the patients values and GOCs.  Vital Signs Vitals:   08/30/19 0820 08/30/19 1230  BP: (!) 144/74 139/83  Pulse: 92 78  Resp:    Temp: 98.2 F (36.8 C)   SpO2: 96% 98%    Intake/Output Summary (Last 24 hours) at 08/30/2019 1323 Last data filed at 08/30/2019 9678 Gross per 24 hour  Intake 50 ml  Output 225 ml  Net -175 ml   Last Weight  Most recent update: 08/30/2019  3:20 AM   Weight  73.9 kg (162 lb 14.7 oz)           Recommendations/Plan:  Will communicate with primary daily  Patients sister is open to residential hospice placement if she does not improve  Continue to encourage supplementals  Time Spent: 25 Greater than 50% of the time was spent in counseling and coordination of  care ______________________________________________________________________________________ Lamarr Lulas [facility] Palliative Medicine Team Team Cell Phone: [phone] Please utilize secure chat with additional questions, if there is no response within 30 minutes please call the above phone number  Palliative Medicine Team providers are available by phone from 7am to 7pm daily and can be reached through the team cell phone.  Should this patient require assistance outside of these hours, please call the patient's attending physician.

## 2019-08-30 NOTE — Progress Notes (Signed)
EEG complete - results pending 

## 2019-08-30 NOTE — Progress Notes (Signed)
[facility] Progress Note  Patient: Denise Santiago    XAJ:[id number]  DOA: 07/26/2019     Date of Service: the patient was seen and examined on 08/30/2019  Chief Complaint  Patient presents with  . Weakness   Brief hospital course: 64 year old female with history of hypertension, diabetes, hyperlipidemia, CVA on 12/25/2018 and 03/27/2019-has a loop recorder, history of tobacco abuse presented to ED with generalized weakness pm 07/26/19.  Patient reported taking Covid vaccine on Saturday, has been feeling weak lethargic, slid out of her chair onto the floor and was not able to get up 1 day PTA.  EMS was called and brought to the ED as patient not able to get up from the floor.  Recently lost 50 pound weight since November since her last stroke, normally lives alone uses walker at home. Patient was found to have acute bilateral pulmonary embolism and ascending colon colitis.  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  Currently further plan is work-up for reversible cause for encephalopathy and consider goals of care engagement with the family.  Assessment and Plan: 1.  GBS Miller Fisher variant Presents with generalized weakness and deconditioning. Patient has poor p.o. intake for 6 months prior to admission. Per sister on the day of patient's Covid vaccine shot patient had a fall and she took an hour to get up from the floor when she went there to take her to the vaccine site. Patient has progressive weakness ongoing from that day. MRI have and spine negative for any acute abnormality. 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 Empirically completed 5 days of IVIG. Due to therapeutic anticoagulation unable to perform LP. ANA normal.   CK normal.  Anti-GQ 1B antibody equivocal in range.  2.  Failure to thrive. Vitamin B12 deficiency. Moderate protein calorie malnutrition.  Patient appears to have progressive failure to thrive at home which continues to worsen. Calorie count demonstrates patient oral intake remains low. If the patient drinks 4 Ensure she was met 80% of her attritional need but she is now pocketing her food. Started on Remeron as well as dronabinol. Currently appears more encephalopathic therefore we will discontinue Demerol and continue Marinol. Monitor.  3.  Acute metabolic encephalopathy Multifactorial in nature. Check B1, B12, TSH, free T4, RPR, EEG. Repeat CT scan as there is an acute change in patient's mental status. We will treat empirically with high-dose IV thiamine and subcu B12. If no improvement in 48 hours no indication to continue this medication further.  4.  Ascending colon colitis Treated with 10 days of IV Rocephin initially was on Zosyn and then Augmentin. Treatment completed.  5.  Bilateral pulmonary embolism. Left leg acute DVT With concern for malignancy work-up was initiated which was positive for bilateral PE. Treated with heparin and subsequently switched to Eliquis. Echocardiogram shows preserved EF no wall motion abnormality no right heart strain.  6.  Acute kidney injury on chronic kidney disease stage II. Renal function relatively stable for now. We will monitor.  At risk for dehydration leading to AKI.  7.  Major depressive disorder. Psychosis. Continue to monitor for now. Psychiatry consulted on 08/19/2019.  Recommend to continue Remeron. Resume once encephalopathy improves.  8.  History of recurrent CVA  MRI on March 25 shows  no acute finding. Continue Plavix statin and Eliquis.  9.  Thrombocytopenia Platelets currently stable.  Monitor.  10 urinary retention currently resolved Foley catheter removed and patient is able to void  11.  Goals of care  discussion Palliative care consulted. Patient prognosis remains guarded given her failure to thrive and failure to respond to aggressive therapy with ongoing weakness and confusion. PT OT consulted currently recommends SNF although patient's mentation waxes and wanes With moderate protein calorie malnutrition patient remains at risk for poor outcome and progressive decline. Do not think that the patient is a candidate for PEG tube placement.  Body mass index is 29.8 kg/m.  Nutrition Problem: Moderate Malnutrition Etiology: chronic illness(stroke) Interventions: Interventions: Ensure Enlive (each supplement provides 350kcal and 20 grams of protein), Magic cup, Calorie Count, Other (Comment)(1:1 feeding assistance)      Diet: Cardiac diet dysphagia 1 DVT Prophylaxis: Therapeutic Anticoagulation with Eliquis   Advance goals of care discussion: DNR  Family Communication: no family was present at bedside, at the time of interview.  Discussed with sister on the phone.  Disposition:  Status is: Inpatient  Remains inpatient appropriate because:Unsafe d/c plan   Dispo: The patient is from: Home              Anticipated d/c is to: SNF              Anticipated d/c date is: > 3 days              Patient currently is not medically stable to d/c.  Subjective: Remains confused.  Unable to follow commands.  No nausea no vomiting.  Physical Exam: General: Alert and awake not oriented to time, place, and person.  Appear in moderate distress, affect flat in affect Eyes: PERRL ENT: Oral Mucosa Clear, dry  Neck: difficult to assess  JVD,  Cardiovascular: S1 and S2 Present, no Murmur,  Respiratory: good respiratory effort, Bilateral Air entry equal and Decreased, no Crackles, no wheezes Abdomen: Bowel Sound present, Soft and no tenderness,  Skin: no rash Extremities: no Pedal edema, no calf tenderness Neurologic: Unable to follow commands.  Left-sided weakness.  More on lower extremities    Gait not checked due to patient safety concerns  Vitals:   08/30/19 0320 08/30/19 0419 08/30/19 0820 08/30/19 1230  BP:  (!) 153/78 (!) 144/74 139/83  Pulse:  96 92 78  Resp:  19    Temp:  98.6 F (37 C) 98.2 F (36.8 C)   TempSrc:  Oral Oral   SpO2:  96% 96% 98%  Weight: 73.9 kg     Height:        Intake/Output Summary (Last 24 hours) at 08/30/2019 1733 Last data filed at 08/30/2019 6767 Gross per 24 hour  Intake 50 ml  Output 225 ml  Net -175 ml   Filed Weights   08/27/19 0435 08/29/19 0316 08/30/19 0320  Weight: 78.3 kg 73.5 kg 73.9 kg    Data Reviewed: I have personally reviewed and interpreted daily labs, tele strips, imagings as discussed above. I reviewed all nursing notes, pharmacy notes, vitals, pertinent old records I have discussed plan of care as described above with RN and patient/family.  CBC: Recent Labs  Lab 08/25/19 0417 08/26/19 0011 08/27/19 0452 08/28/19 0411 08/29/19 1309  WBC 4.2 4.3 4.5 5.4 5.7  NEUTROABS  --   --  2.9  --  4.0  HGB 11.8* 11.3* 11.6* 11.5* 12.8  HCT 35.9* 34.7* 35.5* 35.6* 37.2  MCV 93.5  93.3 94.2 94.2 95.4  PLT 155 169 170 173 648   Basic Metabolic Panel: Recent Labs  Lab 08/24/19 0048 08/25/19 0417 08/26/19 0011 08/27/19 0452 08/29/19 1309  NA 141 139 137 139 141  K 4.1 4.3 3.8 3.8 3.8  CL 108 109 107 108 109  CO2 23 20* 22 22 23   GLUCOSE 129* 119* 113* 131* 129*  BUN 11 14 13 14 23   CREATININE 0.90 0.89 0.90 0.93 1.00  CALCIUM 9.2 9.0 9.0 9.3 9.5  MG 1.9  --   --  2.0  --   PHOS 3.1  --   --   --   --     Studies: CT HEAD WO CONTRAST  Result Date: 08/29/2019 CLINICAL DATA:  Encephalopathy. EXAM: CT HEAD WITHOUT CONTRAST TECHNIQUE: Contiguous axial images were obtained from the base of the skull through the vertex without intravenous contrast. COMPARISON:  MRI 08/05/2019 FINDINGS: Brain: No acute CT finding. No abnormality affects the brainstem or cerebellum. Multiple old infarctions in the right  hemisphere, the largest affecting the posterior temporal and parietal lobe with smaller cortical and subcortical infarctions in the frontal operculum and posterior frontal region. Old lacunar infarctions of both basal ganglia. Chronic small-vessel changes of the white matter. No evidence of mass lesion, hemorrhage, hydrocephalus or extra-axial collection. Vascular: There is atherosclerotic calcification of the major vessels at the base of the brain. Skull: Negative Sinuses/Orbits: Clear/normal Other: None IMPRESSION: No acute finding. Multiple cortical and subcortical infarctions in the right hemisphere. Old bilateral basal ganglia infarctions. Electronically Signed   By: Nelson Chimes M.D.   On: 08/29/2019 21:10    Scheduled Meds: . antiseptic oral rinse  15 mL Mouth Rinse BID  . apixaban  5 mg Oral BID  . atorvastatin  80 mg Oral q1800  . cyanocobalamin  1,000 mcg Subcutaneous Daily  . dronabinol  2.5 mg Oral BID AC  . feeding supplement  1 Container Oral TID WC  . feeding supplement (ENSURE ENLIVE)  237 mL Oral QID  . folic acid  1 mg Intravenous Daily  . insulin aspart  0-15 Units Subcutaneous TID WC  . insulin aspart  0-5 Units Subcutaneous QHS  . lactulose  20 g Oral BID  . latanoprost  1 drop Both Eyes QHS  . lisinopril  20 mg Oral Daily  . metoprolol tartrate  25 mg Oral BID  . nystatin  5 mL Oral QID  . OLANZapine  5 mg Oral QHS  . polyethylene glycol  17 g Oral Daily  . potassium chloride  20 mEq Oral Daily  . senna-docusate  1 tablet Oral BID  . sertraline  25 mg Oral Daily  . sodium chloride flush  3 mL Intravenous Once   Continuous Infusions: . thiamine injection 500 mg (08/30/19 1315)   PRN Meds: acetaminophen **OR** acetaminophen, ondansetron **OR** ondansetron (ZOFRAN) IV, vitamin A & D  Time spent: 35 minutes  Author: Berle Mull, MD [facility] 08/30/2019 5:33 PM  To reach On-call, see care teams to locate the attending and reach out to them via  [email or web address]. If 7PM-7AM, please contact night-coverage If you still have difficulty reaching the attending provider, please page the [facility] (Director on Call) for [facility] on amion for assistance.

## 2019-08-31 LAB — GLUCOSE, CAPILLARY
Glucose-Capillary: 128 mg/dL — ABNORMAL HIGH (ref 70–99)
Glucose-Capillary: 97 mg/dL (ref 70–99)
Glucose-Capillary: 154 mg/dL — ABNORMAL HIGH (ref 70–99)
Glucose-Capillary: 132 mg/dL — ABNORMAL HIGH (ref 70–99)
Glucose-Capillary: 128 mg/dL — ABNORMAL HIGH (ref 70–99)

## 2019-08-31 LAB — CBC
HCT: 36.8 % (ref 36.0–46.0)
Hemoglobin: 11.8 g/dL — ABNORMAL LOW (ref 12.0–15.0)
MCH: 30.5 pg (ref 26.0–34.0)
MCHC: 32.1 g/dL (ref 30.0–36.0)
MCV: 95.1 fL (ref 80.0–100.0)
Platelets: 162 10*3/uL (ref 150–400)
RBC: 3.87 MIL/uL (ref 3.87–5.11)
RDW: 17 % — ABNORMAL HIGH (ref 11.5–15.5)
WBC: 6.2 10*3/uL (ref 4.0–10.5)
nRBC: 0 % (ref 0.0–0.2)

## 2019-08-31 NOTE — Progress Notes (Addendum)
[facility] Progress Note  Patient: Denise Santiago    ZOX:[id number]  DOA: 07/26/2019     Date of Service: the patient was seen and examined on 08/31/2019  Chief Complaint  Patient presents with  . Weakness   Brief hospital course: 64 year old female with history of hypertension, diabetes, hyperlipidemia, CVA on 12/25/2018 and 03/27/2019-has a loop recorder, history of tobacco abuse presented to ED with generalized weakness pm 07/26/19.  Patient reported taking Covid vaccine on Saturday, has been feeling weak lethargic, slid out of her chair onto the floor and was not able to get up 1 day PTA.  EMS was called and brought to the ED as patient not able to get up from the floor.  Recently lost 50 pound weight since November since her last stroke, normally lives alone uses walker at home. Patient was found to have acute bilateral pulmonary embolism and ascending colon colitis.  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  Currently further plan is continue to engage with family regarding goals of care.  Assessment and Plan: 1.  GBS Miller Fisher variant Presents with generalized weakness and deconditioning. Patient has poor p.o. intake for 6 months prior to admission. Per sister on the day of patient's Covid vaccine shot patient had a fall and she took an hour to get up from the floor when she went there to take her to the vaccine site. Patient has progressive weakness ongoing from that day. MRI have and spine negative for any acute abnormality. 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 Empirically completed 5 days of IVIG. Due to therapeutic anticoagulation unable to perform LP. ANA normal.  CK normal.  Anti-GQ 1B antibody equivocal in  range.  2.  Failure to thrive. Vitamin B12 deficiency. Moderate protein calorie malnutrition. Patient appears to have progressive failure to thrive at home which continues to worsen. Calorie count demonstrates patient oral intake remains low. If the patient drinks 4 Ensure she was met 80% of her attritional need but she is now pocketing her food. Started on Remeron as well as dronabinol. Currently appears more encephalopathic therefore we will discontinue Demerol and continue Marinol. Monitor.  3.  Acute metabolic encephalopathy Multifactorial in nature. Check B1, B12, TSH, free T4, RPR, EEG. Repeat CT scan as there is an acute change in patient's mental status. Patient returned to empirically with high-dose thiamine, subcutaneous W11 as well as folic acid.  Currently mentation unchanged and is about the same.  Remains with poor p.o. intake and pocketing food. At this point patient's prognosis is highly poor given her prolonged failure to thrive and failure to respond to therapy with reversible etiologies. Patient would be an ideal candidate to consider hospice at the time of the discharge.  4.  Ascending colon colitis Treated with 10 days of IV Rocephin initially was on Zosyn and then Augmentin. Treatment completed.  5.  Bilateral pulmonary embolism. Left leg acute DVT With concern for malignancy work-up was initiated which was positive for bilateral PE. Treated with heparin and subsequently switched to Eliquis. Echocardiogram shows preserved EF no wall motion abnormality no right heart strain.  6.  Acute kidney injury on chronic kidney disease stage II. Renal function relatively stable for now. We will monitor.  At risk for dehydration leading to  AKI.  7.  Major depressive disorder. Psychosis. Continue to monitor for now. Psychiatry consulted on 08/19/2019.  Recommend to continue Remeron. Resume once encephalopathy improves.  8.  History of recurrent CVA  MRI on March 25  shows no acute finding. Continue Plavix statin and Eliquis.  9.  Thrombocytopenia Platelets currently stable.  Monitor.  10 urinary retention currently resolved Foley catheter removed and patient is able to void  11.  Goals of care discussion Palliative care consulted. Patient prognosis remains guarded given her failure to thrive and failure to respond to aggressive therapy with ongoing weakness and confusion. PT OT consulted currently recommends SNF although patient's mentation waxes and wanes With moderate protein calorie malnutrition patient remains at risk for poor outcome and progressive decline. Do not think that the patient is a candidate for PEG tube placement.  Body mass index is 29.52 kg/m.  Nutrition Problem: Moderate Malnutrition Etiology: chronic illness(stroke) Interventions: Interventions: Ensure Enlive (each supplement provides 350kcal and 20 grams of protein), Magic cup, Calorie Count, Other (Comment)(1:1 feeding assistance)      Diet: Cardiac diet dysphagia 1 DVT Prophylaxis: Therapeutic Anticoagulation with Eliquis   Advance goals of care discussion: DNR  Family Communication: no family was present at bedside, at the time of interview.  Discussed with sister on the phone over there weekend. Left voicemail on 08/31/2019   Disposition:  Status is: Inpatient  Remains inpatient appropriate because:Unsafe d/c plan   Dispo: The patient is from: Home              Anticipated d/c is to: SNF              Anticipated d/c date is: > 3 days              Patient currently is not medically stable to d/c.  Subjective: Remains confused.  Unable to follow commands.  No nausea no vomiting.  Physical Exam: General: Alert and awake not oriented to time, place, and person.  Appear in moderate distress, affect flat in affect Eyes: PERRL ENT: Oral Mucosa Clear, dry  Neck: difficult to assess  JVD,  Cardiovascular: S1 and S2 Present, no Murmur,  Respiratory: good  respiratory effort, Bilateral Air entry equal and Decreased, no Crackles, no wheezes Abdomen: Bowel Sound present, Soft and no tenderness,  Skin: no rash Extremities: no Pedal edema, no calf tenderness Neurologic: Unable to follow commands.  Left-sided weakness.  More on lower extremities  Gait not checked due to patient safety concerns  Vitals:   08/31/19 0441 08/31/19 0838 08/31/19 1240 08/31/19 1554  BP: (!) 157/76 (!) 142/75 (!) 133/107 (!) 141/79  Pulse: 100 (!) 105 92 86  Resp: 17 20 20 18   Temp: 97.8 F (36.6 C) 97.6 F (36.4 C) 97.9 F (36.6 C) 97.6 F (36.4 C)  TempSrc: Axillary Oral Oral Oral  SpO2: 100% 96% 100% 98%  Weight:      Height:        Intake/Output Summary (Last 24 hours) at 08/31/2019 1925 Last data filed at 08/31/2019 0623 Gross per 24 hour  Intake 150 ml  Output --  Net 150 ml   Filed Weights   08/29/19 0316 08/30/19 0320 08/31/19 0328  Weight: 73.5 kg 73.9 kg 73.2 kg    Data Reviewed: I have personally reviewed and interpreted daily labs, tele strips, imagings as discussed above. I reviewed all nursing notes, pharmacy notes, vitals, pertinent old records I have discussed plan of care as described above with RN and patient/family.  CBC: Recent Labs  Lab 08/26/19 0011 08/27/19 0452 08/28/19 0411 08/29/19 1309 08/30/19 2354  WBC 4.3 4.5 5.4 5.7 6.2  NEUTROABS  --  2.9  --  4.0  --   HGB 11.3* 11.6* 11.5* 12.8 11.8*  HCT 34.7* 35.5* 35.6* 37.2 36.8  MCV 93.3 94.2 94.2 95.4 95.1  PLT 169 170 173 189 356   Basic Metabolic Panel: Recent Labs  Lab 08/25/19 0417 08/26/19 0011 08/27/19 0452 08/29/19 1309  NA 139 137 139 141  K 4.3 3.8 3.8 3.8  CL 109 107 108 109  CO2 20* 22 22 23   GLUCOSE 119* 113* 131* 129*  BUN 14 13 14 23   CREATININE 0.89 0.90 0.93 1.00  CALCIUM 9.0 9.0 9.3 9.5  MG  --   --  2.0  --     Studies: No results found.  Scheduled Meds: . antiseptic oral rinse  15 mL Mouth Rinse BID  . apixaban  5 mg Oral BID  .  atorvastatin  80 mg Oral q1800  . dronabinol  2.5 mg Oral BID AC  . feeding supplement  1 Container Oral TID WC  . feeding supplement (ENSURE ENLIVE)  237 mL Oral QID  . folic acid  1 mg Intravenous Daily  . insulin aspart  0-15 Units Subcutaneous TID WC  . insulin aspart  0-5 Units Subcutaneous QHS  . lactulose  20 g Oral BID  . latanoprost  1 drop Both Eyes QHS  . lisinopril  20 mg Oral Daily  . metoprolol tartrate  25 mg Oral BID  . nystatin  5 mL Oral QID  . OLANZapine  5 mg Oral QHS  . polyethylene glycol  17 g Oral Daily  . potassium chloride  20 mEq Oral Daily  . sertraline  25 mg Oral Daily  . sodium chloride flush  3 mL Intravenous Once   Continuous Infusions:  PRN Meds: acetaminophen **OR** acetaminophen, ondansetron **OR** ondansetron (ZOFRAN) IV, vitamin A & D  Time spent: 35 minutes  Author: Berle Mull, MD [facility] 08/31/2019 7:25 PM  To reach On-call, see care teams to locate the attending and reach out to them via [email or web address]. If 7PM-7AM, please contact night-coverage If you still have difficulty reaching the attending provider, please page the [facility] (Director on Call) for [facility] on amion for assistance.

## 2019-08-31 NOTE — Progress Notes (Signed)
Physical Therapy Treatment Patient Details Name: Denise Santiago MRN: [id number] DOB: 1955-06-02 Today's Date: 08/31/2019    History of Present Illness (P) 64 y.o. female with a hx of DM, HTN, HLD, CVA 12/2018 & 03/2019, ongoing tob use who presented to ED with generalized weakness and elevated troponin, thought to be non specific by cardiology. pt reports weakness after COVID vaccine 07/16/19. The day prior to admission pt slid out of chair to floor and remained through the night.  Addendum 08/24/19: patient has lost more than 50 pounds since November after her last stroke. Malignancy work up: CT chest-acute bilateral pulmonary embolism; CT abd  ascending colon colitis. 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 could not due LP due to anticoagulation. 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      PT Comments    Patient seen for bed level mobility and therex. Pt is oriented to self and with tangential thoughts and eyes closed most of session. Most movement of all extremities appears non purposeful and extensor tone noted in bilat LE. Pt with jerking movements of bilat UE and hands fidgeting throughout session.  Pt appears scared with all changes to position especially when raising head of bed. PT will continue to follow acutely.    Follow Up Recommendations  (P) Supervision/Assistance - 24 hour;SNF     Equipment Recommendations  (P) None recommended by PT    Recommendations for Other Services (P) Speech consult     Precautions / Restrictions Precautions Precautions: (P) Fall Restrictions Weight Bearing Restrictions: (P) No    Mobility  Bed Mobility Overal bed mobility: (P) Needs Assistance Bed Mobility: (P) Rolling Rolling: (P) Total assist         General bed mobility comments: (P) total A for all bed mobility; hand over hand assist for attempted use of bed rails however pt does not maintain grip; pt assisted  up in bed and repositioned with pillows to midline posture; pt with L lateral bias every time seen in bed   Transfers                    Ambulation/Gait                 Stairs             Wheelchair Mobility    Modified Rankin (Stroke Patients Only) Modified Rankin (Stroke Patients Only) Pre-Morbid Rankin Score: (P) No significant disability Modified Rankin: (P) Severe disability     Balance                                            Cognition Arousal/Alertness: (P) Lethargic Behavior During Therapy: (P) Flat affect Overall Cognitive Status: (P) Impaired/Different from baseline Area of Impairment: (P) Orientation;Attention;Memory;Following commands;Problem solving                 Orientation Level: (P) Disoriented to;Place;Time;Situation Current Attention Level: (P) Focused Memory: (P) Decreased short-term memory Following Commands: (P) (pt not following commands)     Problem Solving: (P) Slow processing;Decreased initiation;Difficulty sequencing;Requires verbal cues;Requires tactile cues General Comments: (P) pt is not following cues this session and with grossly nonpurposeful movements; bilat UE jerking and "fidgeting" with hands throughout session; pt states "an open pathway" when asked where we are and appeared suprprised when oriented to situation; pt does not recall location  or situation told to her at beginning of session      Exercises General Exercises - Upper Extremity Shoulder Flexion: (P) AROM;Both Elbow Flexion: (P) AROM;Both General Exercises - Lower Extremity Ankle Circles/Pumps: (P) AROM;Both Long Arc Quad: (P) AROM;Both;10 reps;Supine Heel Slides: (P) AAROM Hip ABduction/ADduction: (P) AAROM;Both;10 reps;Supine Low Level/ICU Exercises Stabilized Bridging: (P) AAROM;Both;5 reps;Supine Other Exercises Other Exercises: (P) supine bil LE coordination exercises with RLE more dyscoordinated than LLE Other  Exercises: (P) Initiation of core/trunk with UEs reaching to right/left across body and attempting to lift trunk off bed with assist x2 both directions. Other Exercises: (P) bridging x7 stabilizing bil feet and LEs    General Comments        Pertinent Vitals/Pain Pain Assessment: (P) Faces Faces Pain Scale: (P) Hurts little more Pain Location: (P) shoulders Pain Descriptors / Indicators: (P) Guarding;Grimacing Pain Intervention(s): (P) Limited activity within patient's tolerance;Repositioned    Home Living                      Prior Function            PT Goals (current goals can now be found in the care plan section) Acute Rehab PT Goals PT Goal Formulation: (P) Patient unable to participate in goal setting Time For Goal Achievement: (P) 08/24/19 Potential to Achieve Goals: (P) Fair    Frequency    (P) Min 3X/week      PT Plan (P) Current plan remains appropriate    Co-evaluation              AM-PAC PT "6 Clicks" Mobility   Outcome Measure  Help needed turning from your back to your side while in a flat bed without using bedrails?: (P) Total Help needed moving from lying on your back to sitting on the side of a flat bed without using bedrails?: (P) Total Help needed moving to and from a bed to a chair (including a wheelchair)?: (P) Total Help needed standing up from a chair using your arms (e.g., wheelchair or bedside chair)?: (P) Total Help needed to walk in hospital room?: (P) Total Help needed climbing 3-5 steps with a railing? : (P) Total 6 Click Score: (P) 6    End of Session Equipment Utilized During Treatment: (P) Gait belt Activity Tolerance: (P) Patient tolerated treatment well Patient left: (P) with call bell/phone within reach;in bed;with bed alarm set Nurse Communication: (P) Mobility status PT Visit Diagnosis: (P) Muscle weakness (generalized) (M62.81);Difficulty in walking, not elsewhere classified (R26.2)     Time:   12:16-12:36    Charges:    Therapeutic activities: 8-22 mins                       Erline Levine, PTA Acute Rehabilitation Services Pager: [phone] Office: [phone]   08/31/2019, 3:43 PM

## 2019-09-01 LAB — GLUCOSE, CAPILLARY
Glucose-Capillary: 152 mg/dL — ABNORMAL HIGH (ref 70–99)
Glucose-Capillary: 134 mg/dL — ABNORMAL HIGH (ref 70–99)
Glucose-Capillary: 176 mg/dL — ABNORMAL HIGH (ref 70–99)
Glucose-Capillary: 121 mg/dL — ABNORMAL HIGH (ref 70–99)

## 2019-09-01 NOTE — Progress Notes (Signed)
PROGRESS NOTE    Denise Santiago  OQH:[id number] DOB: 1955/09/14 DOA: 07/26/2019 PCP: Denise Carol, MD    Brief Narrative:  64 year old female with history of hypertension, diabetes, hyperlipidemia, CVA on 12/25/2018 and 03/27/2019-has a loop recorder, history of tobacco abuse presented to ED with generalized weakness on 07/26/19. Patient reported taking Covid vaccine on Saturday, has been feeling weak lethargic, slid out of her chair onto the floor and was not able to get up 1 day PTA. EMS was called and brought to the ED as patient not able to get up from the floor. Recently lost 50 pound weight since November since her last stroke, normally lives alone uses walker at home.  Patient was found to have acute bilateral pulmonary embolism and ascending colon colitis. 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   Assessment & Plan:   Principal Problem:   Major depressive disorder Active Problems:   Essential hypertension   Type 2 diabetes mellitus without complication (HCC)   CVA (cerebral vascular accident) ([place])   Hyperlipidemia   Generalized weakness   Hypokalemia   Hyponatremia   Acute on chronic kidney failure (HCC)   Thrombocytopenia (HCC)   Elevated troponin   Lactic acidosis   AKI (acute kidney injury) ([place])   Palliative care by specialist   Goals of care, counseling/discussion   Xerostomia   Dry lips   Failure to thrive in adult   Physical debility   Malnutrition of moderate degree   Esophageal thrush (HCC)   Palliative care encounter   Pulmonary embolism (HCC)   DVT (deep venous thrombosis) (HCC)   Thrush, oral   Colitis  GB syndrome, Miller Fisher variant: Presented with generalized weakness and deconditioning.  Had progressive weakness.  MRI of  the head and spine negative for acute abnormalities.  Empirically treated with 5 days of IVIG.  Failure to thrive/B12 deficiency/moderate protein calorie malnutrition: Progressive failure to thrive.  Currently seen by nutrition and on supplements.  Acute metabolic encephalopathy: Multifactorial in nature.  Treated empirically with high-dose thiamine and subcutaneous T46 and folic acid.  Mental status has improved.  Unknown baseline.  Bilateral pulmonary embolism/left leg acute DVT: Treated with heparin and subsequently converted to Eliquis.  Currently therapeutic on Eliquis.  Acute kidney injury on chronic kidney disease stage II: Improved.  Normalized.  Major depressive disorder with psychosis: Seen by psychiatry.  Recommend Remeron.  No indication for inpatient psych admission.  Recurrent CVA: MRI with no acute CVA.  Patient on Plavix, statin and Eliquis.  Goal of care discussion: Followed by palliative care.  Remains DNR.  PT OT consulted, recommend SNF.   DVT prophylaxis: Eliquis Code Status: DNR Family Communication: None Disposition Plan: Status is: Inpatient  Remains inpatient appropriate because:Unsafe d/c plan   Dispo: The patient is from: Home              Anticipated d/c is to: SNF              Anticipated d/c date is: 1 day              Patient currently is medically stable to d/c.          Consultants:   Neurology  Procedures:   None  Antimicrobials:  Antibiotics Given (last 72 hours)  None         Subjective: Patient seen and examined.  No overnight events.  She denied any complaints.  Objective: Vitals:   09/01/19 0800 09/01/19 0847 09/01/19 1200 09/01/19 1216  BP: 121/87 121/87 (!) 135/94 (!) 135/94  Pulse: 83 86 77 74  Resp:  16 20 20   Temp: 98.6 F (37 C) 98.6 F (37 C) 98.4 F (36.9 C) 98.4 F (36.9 C)  TempSrc: Axillary Axillary Axillary Axillary  SpO2: 99% 99% 97% 97%  Weight:      Height:        Intake/Output Summary  (Last 24 hours) at 09/01/2019 1555 Last data filed at 09/01/2019 0600 Gross per 24 hour  Intake 150 ml  Output 100 ml  Net 50 ml   Filed Weights   08/29/19 0316 08/30/19 0320 08/31/19 0328  Weight: 73.5 kg 73.9 kg 73.2 kg    Examination:  General exam: Appears calm and comfortable, alert and awake x2.  Chronically sick looking but not in any distress. Respiratory system: Clear to auscultation. Respiratory effort normal. Cardiovascular system: S1 & S2 heard, RRR. No JVD, murmurs, rubs, gallops or clicks. No pedal edema. Gastrointestinal system: Abdomen is nondistended, bowel sounds present.  Data Reviewed: I have personally reviewed following labs and imaging studies  CBC: Recent Labs  Lab 08/26/19 0011 08/27/19 0452 08/28/19 0411 08/29/19 1309 08/30/19 2354  WBC 4.3 4.5 5.4 5.7 6.2  NEUTROABS  --  2.9  --  4.0  --   HGB 11.3* 11.6* 11.5* 12.8 11.8*  HCT 34.7* 35.5* 35.6* 37.2 36.8  MCV 93.3 94.2 94.2 95.4 95.1  PLT 169 170 173 189 162   Basic Metabolic Panel: Recent Labs  Lab 08/26/19 0011 08/27/19 0452 08/29/19 1309  NA 137 139 141  K 3.8 3.8 3.8  CL 107 108 109  CO2 22 22 23   GLUCOSE 113* 131* 129*  BUN 13 14 23   CREATININE 0.90 0.93 1.00  CALCIUM 9.0 9.3 9.5  MG  --  2.0  --    GFR: Estimated Creatinine Clearance: 53.2 mL/min (by C-G formula based on SCr of 1 mg/dL). Liver Function Tests: Recent Labs  Lab 08/26/19 0011 08/27/19 0452 08/29/19 1309  AST 37 35 28  ALT 26 27 25   ALKPHOS 56 59 59  BILITOT 0.5 0.6 0.9  PROT 6.6 7.0 7.4  ALBUMIN 2.6* 2.6* 2.8*   No results for input(s): LIPASE, AMYLASE in the last 168 hours. Recent Labs  Lab 08/29/19 1309  AMMONIA 48*   Coagulation Profile: No results for input(s): INR, PROTIME in the last 168 hours. Cardiac Enzymes: No results for input(s): CKTOTAL, CKMB, CKMBINDEX, TROPONINI in the last 168 hours. BNP (last 3 results) No results for input(s): PROBNP in the last 8760 hours. HbA1C: No results  for input(s): HGBA1C in the last 72 hours. CBG: Recent Labs  Lab 08/31/19 1207 08/31/19 1553 08/31/19 2130 09/01/19 0621 09/01/19 1132  GLUCAP 154* 132* 128* 152* 134*   Lipid Profile: No results for input(s): CHOL, HDL, LDLCALC, TRIG, CHOLHDL, LDLDIRECT in the last 72 hours. Thyroid Function Tests: No results for input(s): TSH, T4TOTAL, FREET4, T3FREE, THYROIDAB in the last 72 hours. Anemia Panel: No results for input(s): VITAMINB12, FOLATE, FERRITIN, TIBC, IRON, RETICCTPCT in the last 72 hours. Sepsis Labs: No results for input(s): PROCALCITON, LATICACIDVEN in the last 168 hours.  No results found for this or any previous visit (from the past 240 hour(s)).       Radiology Studies: EEG adult  Result Date: 08/30/2019 Charlsie Quest, MD     08/30/2019  6:18 PM Patient Name: Mccall Will MRN: [id number] Epilepsy Attending: Charlsie Quest Referring Physician/Provider: Dr Lynden Oxford Date: 08/30/2019 Duration: 23.27 mins Patient history: 64yo F with ams. EEG to evaluate for seizure. Level of alertness: awake AEDs during EEG study: None Technical aspects: This EEG study was done with scalp electrodes positioned according to the 10-20 International system of electrode placement. Electrical activity was acquired at a sampling rate of 500Hz  and reviewed with a high frequency filter of 70Hz  and a low frequency filter of 1Hz . EEG data were recorded continuously and digitally stored. DESCRIPTION:  No clear posterior dominant rhythm was seen. EEG showed continuous generalized and maximal right frontotemporal region 5-7hz  theta-delta slowing. Hyperventilation and photic stimulation were not performed. ABNORMALITY - Continuous slow, generalized and maximal right frontotemporal region IMPRESSION: This study is suggestive of non specific cortical dysfunction in right temporal region likely secondary to underlying encephalomalacia as well as mild to moderate diffuse encephalopathy, non specific to  etiology.  No seizures or epileptiform discharges were seen throughout the recording. Priyanka        Scheduled Meds: . antiseptic oral rinse  15 mL Mouth Rinse BID  . apixaban  5 mg Oral BID  . atorvastatin  80 mg Oral q1800  . dronabinol  2.5 mg Oral BID AC  . feeding supplement  1 Container Oral TID WC  . feeding supplement (ENSURE ENLIVE)  237 mL Oral QID  . insulin aspart  0-15 Units Subcutaneous TID WC  . insulin aspart  0-5 Units Subcutaneous QHS  . lactulose  20 g Oral BID  . latanoprost  1 drop Both Eyes QHS  . lisinopril  20 mg Oral Daily  . metoprolol tartrate  25 mg Oral BID  . nystatin  5 mL Oral QID  . OLANZapine  5 mg Oral QHS  . polyethylene glycol  17 g Oral Daily  . potassium chloride  20 mEq Oral Daily  . sertraline  25 mg Oral Daily  . sodium chloride flush  3 mL Intravenous Once   Continuous Infusions:   LOS: 36 days    Time spent: 25 minutes    , MD [facility] Pager [phone]

## 2019-09-01 NOTE — Progress Notes (Signed)
Occupational Therapy Treatment Patient Details Name: Denise Santiago MRN: [id number] DOB: [birth year] Today's Date: 09/01/2019    History of present illness 64 y.o. female with a hx of DM, HTN, HLD, CVA 12/2018 & 03/2019, ongoing tob use who presented to ED with generalized weakness and elevated troponin, thought to be non specific by cardiology. pt reports weakness after COVID vaccine 07/16/19. The day prior to admission pt slid out of chair to floor and remained through the night.  Addendum 08/24/19: patient has lost more than 50 pounds since November after her last stroke. Malignancy work up: CT chest-acute bilateral pulmonary embolism; CT abd  ascending colon colitis. 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 could not due LP due to anticoagulation. 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     OT comments  Pt making minimal progress towards OT goals this session. Pt continues to present with impaired cognition, proprioception and poor sitting balance needing MAX A- total A + 2 to sit EOB. Pt noted to have chorea like movement in BUEs with poor proprioception with pt unable to state where therapist was touching pt despite MAX visual cues. Donned pts glasses during session with little change in vision as pt unable to track stimulus or locate therapist when OTA positoned in front of pt. Pt unable to state location despite max cues. DC plan currently remains appropriate, will continue to follow pt acutely per POC.    Follow Up Recommendations  SNF;Supervision/Assistance - 24 hour    Equipment Recommendations  Other (comment)(TBD at next venue of care)    Recommendations for Other Services      Precautions / Restrictions Precautions Precautions: Fall Precaution Comments: reports falls since COVID vaccine Restrictions Weight Bearing Restrictions: No       Mobility Bed Mobility Overal bed mobility: Needs Assistance Bed  Mobility: Rolling;Sidelying to Sit;Sit to Sidelying Rolling: Total assist;+2 for physical assistance Sidelying to sit: +2 for physical assistance;Total assist     Sit to sidelying: Total assist;+2 for physical assistance General bed mobility comments: total A for all bed mobility; hand over hand assist for attempted use of bed rails however pt does not maintain grip  Transfers Overall transfer level: Needs assistance(defer d/t safety)                    Balance Overall balance assessment: Needs assistance Sitting-balance support: Bilateral upper extremity supported;Feet supported Sitting balance-Leahy Scale: Poor Sitting balance - Comments: reliant on BUE support and MAX A +2 to maintain upright posture                                   ADL either performed or assessed with clinical judgement   ADL Overall ADL's : Needs assistance/impaired                           Toilet Transfer Details (indicate cue type and reason): defer d/t safety         Functional mobility during ADLs: Total assistance;+2 for physical assistance General ADL Comments: pt continues to present with decreased cognition, poor sitting balane and proprioception. Pt unable to accurately state where therapist touching pt and present with decreased GM and FMC movement unable to complete ADL tasks without total A. pt sat EOB with MAX A +2 for sitting balance     Vision Baseline Vision/History: Wears  glasses Wears Glasses: At all times(donned glassess during session with little change in vision) Vision Assessment?: Vision impaired- to be further tested in functional context Additional Comments: pt unable to track stimulus in any quadrant or locate therapist when sitting in front of pt   Perception     Praxis      Cognition Arousal/Alertness: Awake/alert Behavior During Therapy: Restless;Anxious Overall Cognitive Status: Impaired/Different from baseline Area of Impairment:  Orientation;Attention;Memory;Following commands;Safety/judgement;Awareness;Problem solving                 Orientation Level: Disoriented to;Place;Time;Situation Current Attention Level: Focused Memory: Decreased short-term memory Following Commands: (unable to follow commands) Safety/Judgement: Decreased awareness of safety;Decreased awareness of deficits Awareness: Intellectual Problem Solving: Slow processing;Decreased initiation;Difficulty sequencing;Requires verbal cues;Requires tactile cues General Comments: pt unable to state location or name of sister despite sister being present. pt with BUE chorea like movements during session with very poor proprioception and BUE tone noted when sitting EOB        Exercises     Shoulder Instructions       General Comments pts sister present during session; updated sister on POC and current DC recs with sister in agreement for SNF placement    Pertinent Vitals/ Pain       Pain Assessment: Faces Faces Pain Scale: No hurt  Home Living                                          Prior Functioning/Environment              Frequency  Min 2X/week        Progress Toward Goals  OT Goals(current goals can now be found in the care plan section)  Progress towards OT goals: Not progressing toward goals - comment(unable to purposefully follow commands)  Acute Rehab OT Goals Patient Stated Goal: none stated OT Goal Formulation: Patient unable to participate in goal setting Time For Goal Achievement: 09/02/19 Potential to Achieve Goals: [place] Discharge plan remains appropriate    Co-evaluation                 AM-PAC OT "6 Clicks" Daily Activity     Outcome Measure   Help from another person eating meals?: Total Help from another person taking care of personal grooming?: Total Help from another person toileting, which includes using toliet, bedpan, or urinal?: Total   Help from another person  to put on and taking off regular upper body clothing?: Total Help from another person to put on and taking off regular lower body clothing?: Total 6 Click Score: 5    End of Session    OT Visit Diagnosis: Unsteadiness on feet (R26.81);Muscle weakness (generalized) (M62.81);Other symptoms and signs involving cognitive function;Other symptoms and signs involving the nervous system (R29.898)   Activity Tolerance Other (comment)(inability to follow commands)   Patient Left in bed;with call bell/phone within reach;with bed alarm set;with family/visitor present   Nurse Communication Mobility status        Time: 1610-9604 OT Time Calculation (min): 22 min  Charges: OT General Charges $OT Visit: 1 Visit OT Treatments $Therapeutic Activity: 8-22 mins  Lanier Clam., COTA/L Acute Rehabilitation Services [phone]    Ihor Gully 09/01/2019, 3:53 PM

## 2019-09-02 LAB — CBC
HCT: 38.7 % (ref 36.0–46.0)
Hemoglobin: 12.5 g/dL (ref 12.0–15.0)
MCH: 31.4 pg (ref 26.0–34.0)
MCHC: 32.3 g/dL (ref 30.0–36.0)
MCV: 97.2 fL (ref 80.0–100.0)
Platelets: 169 10*3/uL (ref 150–400)
RBC: 3.98 MIL/uL (ref 3.87–5.11)
RDW: 17 % — ABNORMAL HIGH (ref 11.5–15.5)
WBC: 6.2 10*3/uL (ref 4.0–10.5)
nRBC: 0 % (ref 0.0–0.2)

## 2019-09-02 LAB — GLUCOSE, CAPILLARY
Glucose-Capillary: 157 mg/dL — ABNORMAL HIGH (ref 70–99)
Glucose-Capillary: 136 mg/dL — ABNORMAL HIGH (ref 70–99)
Glucose-Capillary: 183 mg/dL — ABNORMAL HIGH (ref 70–99)
Glucose-Capillary: 130 mg/dL — ABNORMAL HIGH (ref 70–99)

## 2019-09-02 NOTE — Progress Notes (Signed)
Physical Therapy Treatment Patient Details Name: Denise Santiago MRN: [id number] DOB: [birth year] Today's Date: 09/02/2019    History of Present Illness 64 y.o. female with a hx of DM, HTN, HLD, CVA 12/2018 & 03/2019, ongoing tob use who presented to ED with generalized weakness and elevated troponin, thought to be non specific by cardiology. pt reports weakness after COVID vaccine 07/16/19. The day prior to admission pt slid out of chair to floor and remained through the night.  Addendum 08/24/19: patient has lost more than 50 pounds since November after her last stroke. Malignancy work up: CT chest-acute bilateral pulmonary embolism; CT abd  ascending colon colitis. 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 could not due LP due to anticoagulation. 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      PT Comments    Patient unable to open her eyes, but spontaneously talking throughout session (tangential, non-sensical fragments of sentences/thoughts). Was following commands with incr time and tactile cues with all extremities. Bil UEs with random movements throughout session. She did attempt to rub in lotion on each leg with each hand, however could not coordinate movements for success. Sitting EOB balance varied min assist (propped on her forearm) to +2 max assist (when leans posterior and cannot recover).   Noted prolonged recovery period expected with Miller-Fisher syndrome. Also noted family is considering Hospice care (pt has not been eating well even prior to this illness). Will continue to follow and may consider requesting Kregg bed (tilt bed) for alternative means of positioning/stimulating patient.     Follow Up Recommendations  Supervision/Assistance - 24 hour;SNF     Equipment Recommendations  None recommended by PT    Recommendations for Other Services       Precautions / Restrictions Precautions Precautions:  Fall Restrictions Weight Bearing Restrictions: No    Mobility  Bed Mobility Overal bed mobility: Needs Assistance Bed Mobility: Supine to Sit;Sit to Supine     Supine to sit: Total assist;+2 for physical assistance;HOB elevated Sit to supine: Total assist;+2 for physical assistance;HOB elevated   General bed mobility comments: has previously appeared "startled/afraid" when moved into sitting EOB or returned to supine therefore elevated to near sitting position via hospital bed and then used bed pad under pt to pivot to dangle at EOB. Reversed process to return to bed and pt with no startle with maneuvers.   Transfers                    Ambulation/Gait                 Stairs             Wheelchair Mobility    Modified Rankin (Stroke Patients Only)       Balance Overall balance assessment: Needs assistance Sitting-balance support: Bilateral upper extremity supported;Feet supported Sitting balance-Leahy Scale: Poor Sitting balance - Comments: poor use of UEs to support her torso in midline; prefers to lean onto her forearm/elbow and still requires min assist in this position to avoid falling backwards (to both rt and left); attempted to engage in rubbing in lotion above her knees and pt attempted to move each hand to thigh when asked, but very dyscoordinated Postural control: Posterior lean;Right lateral lean;Left lateral lean                                  Cognition  Arousal/Alertness: Awake/alert(but cannot open her eyes; talking throughout) Behavior During Therapy: Restless Overall Cognitive Status: Impaired/Different from baseline Area of Impairment: Orientation;Attention;Memory;Following commands;Problem solving;Safety/judgement;Awareness                 Orientation Level: Disoriented to;Place;Time;Situation Current Attention Level: Focused Memory: Decreased short-term memory Following Commands: Follows one step commands  inconsistently;Follows one step commands with increased time Safety/Judgement: Decreased awareness of safety;Decreased awareness of deficits Awareness: (pre-intellectual) Problem Solving: Slow processing;Decreased initiation;Difficulty sequencing;Requires verbal cues;Requires tactile cues General Comments: asked to squeeze with left hand and consistently squeezing rt hand; opened left hand on command; flexed and extended each leg on command with [facility]      Exercises General Exercises - Lower Extremity Heel Slides: PROM;AAROM;Both;5 reps;Supine    General Comments        Pertinent Vitals/Pain Pain Assessment: Faces Faces Pain Scale: No hurt    Home Living                      Prior Function            PT Goals (current goals can now be found in the care plan section) Acute Rehab PT Goals Patient Stated Goal: none stated PT Goal Formulation: Patient unable to participate in goal setting Time For Goal Achievement: 09/16/19 Potential to Achieve Goals: Fair Progress towards PT goals: Goals downgraded-see care plan    Frequency    Min 2X/week      PT Plan Frequency needs to be updated    Co-evaluation              AM-PAC PT "6 Clicks" Mobility   Outcome Measure  Help needed turning from your back to your side while in a flat bed without using bedrails?: Total Help needed moving from lying on your back to sitting on the side of a flat bed without using bedrails?: Total Help needed moving to and from a bed to a chair (including a wheelchair)?: Total Help needed standing up from a chair using your arms (e.g., wheelchair or bedside chair)?: Total Help needed to walk in hospital room?: Total Help needed climbing 3-5 steps with a railing? : Total 6 Click Score: 6    End of Session   Activity Tolerance: Patient tolerated treatment well Patient left: with call bell/phone within reach;in bed;with bed alarm set(semi-chair position)   PT Visit Diagnosis:  Muscle weakness (generalized) (M62.81);Difficulty in walking, not elsewhere classified (R26.2)     Time: 6270-3500 PT Time Calculation (min) (ACUTE ONLY): 21 min  Charges:  $Neuromuscular Re-education: 8-22 mins                      Jerolyn Center, PT Pager [phone]    Zena Amos 09/02/2019, 11:20 AM

## 2019-09-02 NOTE — Progress Notes (Signed)
PROGRESS NOTE    Denise Santiago  QQP:[id number] DOB: 24-Feb-1956 DOA: 07/26/2019 PCP: Seward Carol, MD    Brief Narrative:  64 year old female with history of hypertension, diabetes, hyperlipidemia, CVA on 12/25/2018 and 03/27/2019-has a loop recorder, history of tobacco abuse presented to ED with generalized weakness on 07/26/19. Patient reported taking Covid vaccine on Saturday, has been feeling weak lethargic, slid out of her chair onto the floor and was not able to get up 1 day PTA. EMS was called and brought to the ED as patient not able to get up from the floor. Recently lost 50 pound weight since November since her last stroke, normally lives alone uses walker at home.  Patient was found to have acute bilateral pulmonary embolism and ascending colon colitis. 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   Assessment & Plan:   Principal Problem:   Major depressive disorder Active Problems:   Essential hypertension   Type 2 diabetes mellitus without complication (HCC)   CVA (cerebral vascular accident) ([place])   Hyperlipidemia   Generalized weakness   Hypokalemia   Hyponatremia   Acute on chronic kidney failure (HCC)   Thrombocytopenia (HCC)   Elevated troponin   Lactic acidosis   AKI (acute kidney injury) ([place])   Palliative care by specialist   Goals of care, counseling/discussion   Xerostomia   Dry lips   Failure to thrive in adult   Physical debility   Malnutrition of moderate degree   Esophageal thrush (HCC)   Palliative care encounter   Pulmonary embolism (HCC)   DVT (deep venous thrombosis) (HCC)   Thrush, oral   Colitis  GB syndrome, Miller Fisher variant: Presented with generalized weakness and deconditioning.  Had progressive weakness.  MRI of  the head and spine negative for acute abnormalities.  Empirically treated with 5 days of IVIG.  Failure to thrive/B12 deficiency/moderate protein calorie malnutrition: Progressive failure to thrive.  Currently seen by nutrition and on supplements.  She pockets her food.  Nutrition is overall poor.  Acute metabolic encephalopathy: Multifactorial in nature.  Treated empirically with high-dose thiamine and subcutaneous Z12 and folic acid.  Mental status has improved.  Unknown baseline.  Bilateral pulmonary embolism/left leg acute DVT: Treated with heparin and subsequently converted to Eliquis.  Currently therapeutic on Eliquis.  Acute kidney injury on chronic kidney disease stage II: Improved.  Normalized.  Major depressive disorder with psychosis: Seen by psychiatry.  Recommend Remeron.  No indication for inpatient psych admission.  Recurrent CVA: MRI with no acute CVA.  Patient on Plavix, statin and Eliquis.  Goal of care discussion: Followed by palliative care.  Remains DNR.  PT OT consulted, recommend SNF.  She will benefit with palliative care or hospice follow-up at skilled nursing facility.   DVT prophylaxis: Eliquis Code Status: DNR Family Communication: None Disposition Plan: Status is: Inpatient  Remains inpatient appropriate because:Unsafe d/c plan   Dispo: The patient is from: Home              Anticipated d/c is to: SNF              Anticipated d/c date is: 1 day, when bed available              Patient currently is medically stable to d/c.  Consultants:   Neurology  Palliative  Procedures:   None  Antimicrobials:  Antibiotics Given (last 72 hours)    None         Subjective: Patient seen and examined.  No overnight events.   Nursing staff were giving patient care.  Objective: Vitals:   09/01/19 2000 09/01/19 2342 09/02/19 0415 09/02/19 0823  BP: 122/80 (!) 129/58 135/82 (!) 116/96  Pulse: 72 75 66 73  Resp: 20 18 20 18   Temp: 98.7 F  (37.1 C) 98.7 F (37.1 C) 98.6 F (37 C) 98.2 F (36.8 C)  TempSrc: Axillary Axillary  Oral  SpO2: 98% 93% 100% 98%  Weight:      Height:        Intake/Output Summary (Last 24 hours) at 09/02/2019 1054 Last data filed at 09/02/2019 0900 Gross per 24 hour  Intake 30 ml  Output --  Net 30 ml   Filed Weights   08/29/19 0316 08/30/19 0320 08/31/19 0328  Weight: 73.5 kg 73.9 kg 73.2 kg    Examination:  General exam: Appears calm and comfortable, alert and awake x 1-2.  Chronically sick looking but not in any distress. Respiratory system: Clear to auscultation. Respiratory effort normal. Cardiovascular system: S1 & S2 heard, RRR. No JVD, murmurs, rubs, gallops or clicks. No pedal edema. Gastrointestinal system: Abdomen is nondistended, bowel sounds present. Left-sided hemiplegic, lower extremity more than upper extremity.  Data Reviewed: I have personally reviewed following labs and imaging studies  CBC: Recent Labs  Lab 08/27/19 0452 08/28/19 0411 08/29/19 1309 08/30/19 2354 09/02/19 0027  WBC 4.5 5.4 5.7 6.2 6.2  NEUTROABS 2.9  --  4.0  --   --   HGB 11.6* 11.5* 12.8 11.8* 12.5  HCT 35.5* 35.6* 37.2 36.8 38.7  MCV 94.2 94.2 95.4 95.1 97.2  PLT 170 173 189 162 169   Basic Metabolic Panel: Recent Labs  Lab 08/27/19 0452 08/29/19 1309  NA 139 141  K 3.8 3.8  CL 108 109  CO2 22 23  GLUCOSE 131* 129*  BUN 14 23  CREATININE 0.93 1.00  CALCIUM 9.3 9.5  MG 2.0  --    GFR: Estimated Creatinine Clearance: 53.2 mL/min (by C-G formula based on SCr of 1 mg/dL). Liver Function Tests: Recent Labs  Lab 08/27/19 0452 08/29/19 1309  AST 35 28  ALT 27 25  ALKPHOS 59 59  BILITOT 0.6 0.9  PROT 7.0 7.4  ALBUMIN 2.6* 2.8*   No results for input(s): LIPASE, AMYLASE in the last 168 hours. Recent Labs  Lab 08/29/19 1309  AMMONIA 48*   Coagulation Profile: No results for input(s): INR, PROTIME in the last 168 hours. Cardiac Enzymes: No results for input(s):  CKTOTAL, CKMB, CKMBINDEX, TROPONINI in the last 168 hours. BNP (last 3 results) No results for input(s): PROBNP in the last 8760 hours. HbA1C: No results for input(s): HGBA1C in the last 72 hours. CBG: Recent Labs  Lab 09/01/19 0621 09/01/19 1132 09/01/19 1625 09/01/19 2113 09/02/19 0610  GLUCAP 152* 134* 176* 121* 157*   Lipid Profile: No results for input(s): CHOL, HDL, LDLCALC, TRIG, CHOLHDL, LDLDIRECT in the last 72 hours. Thyroid Function Tests: No results for input(s): TSH, T4TOTAL, FREET4, T3FREE, THYROIDAB in the last 72 hours. Anemia Panel: No results for input(s): VITAMINB12, FOLATE, FERRITIN, TIBC, IRON, RETICCTPCT in the last 72 hours. Sepsis Labs: No results for input(s): PROCALCITON, LATICACIDVEN in the last 168 hours.  No results found for this or any previous visit (from the past  240 hour(s)).       Radiology Studies: No results found.      Scheduled Meds: . antiseptic oral rinse  15 mL Mouth Rinse BID  . apixaban  5 mg Oral BID  . atorvastatin  80 mg Oral q1800  . dronabinol  2.5 mg Oral BID AC  . feeding supplement  1 Container Oral TID WC  . feeding supplement (ENSURE ENLIVE)  237 mL Oral QID  . insulin aspart  0-15 Units Subcutaneous TID WC  . insulin aspart  0-5 Units Subcutaneous QHS  . lactulose  20 g Oral BID  . latanoprost  1 drop Both Eyes QHS  . lisinopril  20 mg Oral Daily  . metoprolol tartrate  25 mg Oral BID  . nystatin  5 mL Oral QID  . OLANZapine  5 mg Oral QHS  . polyethylene glycol  17 g Oral Daily  . potassium chloride  20 mEq Oral Daily  . sertraline  25 mg Oral Daily  . sodium chloride flush  3 mL Intravenous Once   Continuous Infusions:   LOS: 37 days    Time spent: 25 minutes    Dorcas Carrow, MD [facility] Pager [phone]

## 2019-09-02 NOTE — Progress Notes (Signed)
  Speech Language Pathology Treatment: Dysphagia  Patient Details Name: Denise Santiago MRN: [id number] DOB: 05/09/1955 Today's Date: 09/02/2019 Time: 7628-3151 SLP Time Calculation (min) (ACUTE ONLY): 10 min  Assessment / Plan / Recommendation Clinical Impression  Pt presents similarly to initial evaluation with what appears to be a cognitively-based dysphagia. She did have a single throat clear after taking several large straw sips in a row, but otherwise her difficulties appear to be more oral. NT reported minimal intake at breakfast due to significant oral holding, minimally improved with liquid washes. This afternoon, in order to initiate sucking on the straw, pt consistently needed a small amount of liquid siphoned from the straw to trigger her initiation and awareness. Even with Max cues, she only took in a few ounces of liquid. I think that overall her risk for inadequate nutrition/hydration remains high unless her mentation were to show significant improvements.    HPI HPI: 64 year old woman with prior history of hypertension, diabetes, hyperlipidemia, CVA presents to ED for generalized weakness and fall.  Probably secondary to electrolyte abnormalities per MD. MRI shows chronic cortically based infarct within the posterior right cerebral hemisphere predominantly affecting the right temporal and occipital lobes and with minimal involvement of the adjacent right parietal lobe. Redemonstrated chronic cortically based infarct within right frontal operculum. Stable background chronic small vessel ischemic disease with chronic lacunar infarcts in the right corona radiata and bilateral basal ganglia.      SLP Plan  Continue with current plan of care       Recommendations  Diet recommendations: Dysphagia 1 (puree);Thin liquid Liquids provided via: Cup;Straw Medication Administration: Crushed with puree Supervision: Full supervision/cueing for compensatory strategies;Trained caregiver to  feed patient Compensations: Slow rate;Small sips/bites;Follow solids with liquid Postural Changes and/or Swallow Maneuvers: Seated upright 90 degrees                Oral Care Recommendations: Oral care QID Follow up Recommendations: 24 hour supervision/assistance SLP Visit Diagnosis: Dysphagia, oral phase (R13.11) Plan: Continue with current plan of care       GO                 Mahala Menghini., M.A. CCC-SLP Acute Rehabilitation Services Pager [phone] Office [phone]  09/02/2019, 12:42 PM

## 2019-09-03 DIAGNOSIS — F329 Major depressive disorder, single episode, unspecified: Secondary | ICD-10-CM

## 2019-09-03 LAB — RESPIRATORY PANEL BY RT PCR (FLU A&B, COVID)
Influenza A by PCR: NEGATIVE
Influenza B by PCR: NEGATIVE
SARS Coronavirus 2 by RT PCR: NEGATIVE

## 2019-09-03 LAB — GLUCOSE, CAPILLARY
Glucose-Capillary: 133 mg/dL — ABNORMAL HIGH (ref 70–99)
Glucose-Capillary: 165 mg/dL — ABNORMAL HIGH (ref 70–99)
Glucose-Capillary: 155 mg/dL — ABNORMAL HIGH (ref 70–99)
Glucose-Capillary: 241 mg/dL — ABNORMAL HIGH (ref 70–99)
Glucose-Capillary: 69 mg/dL — ABNORMAL LOW (ref 70–99)
Glucose-Capillary: 80 mg/dL (ref 70–99)

## 2019-09-03 MED ORDER — DEXTROSE 50 % IV SOLN
INTRAVENOUS | Status: AC
  Filled 2019-09-03: qty 50

## 2019-09-03 NOTE — TOC Progression Note (Addendum)
Transition of Care [facility]) - Progression Note    Patient Details  Name: Denise Santiago MRN: [id number] Date of Birth: 31-Oct-1955  Transition of Care [facility]) CM/SW Contact  Baldemar Lenis, [place] Phone Number: 09/03/2019, 3:11 PM  Clinical Narrative:   CSW alerted by PMT NP and MD that patient is hospice appropriate, but prognosis is several weeks still; will not qualify for [facility], but may qualify for residential at other hospice facility outside of the area. Family is agreeable to see if she'd qualify. CSW contacted [facility] and provided referral, will follow to see if patient qualifies for hospice program.  UPDATE: CSW contacted by [facility] that they have approved the patient for admission, trying to get in touch with family to confirm. Patient will need a new COVID test prior to admission, asked MD to order. CSW to follow.  Expected Discharge Plan: Hospice Medical Facility Barriers to Discharge: Inadequate or no insurance, Continued Medical Work up, SNF Pending payor source - LOG  Expected Discharge Plan and Services Expected Discharge Plan: Hospice Medical Facility     Post Acute Care Choice: Skilled Nursing Facility Living arrangements for the past 2 months: Single Family Home                                       Social Determinants of Health (SDOH) Interventions    Readmission Risk Interventions No flowsheet data found.

## 2019-09-03 NOTE — Progress Notes (Signed)
Nutrition Follow-up  DOCUMENTATION CODES:   Non-severe (moderate) malnutrition in context of chronic illness  INTERVENTION:  Recommend comfort feeds.  Continue 1:1 feeding assistance  Staff should continue to offer Ensure Enlive and Boost Breeze to pt, but should not force her to consume these supplements as feeding should be a source of comfort.    NUTRITION DIAGNOSIS:   Moderate Malnutrition related to chronic illness(stroke) as evidenced by energy intake < 75% for > or equal to 1 month, percent weight loss, edema, mild fat depletion, mild muscle depletion.  Ongoing.  GOAL:   Patient will meet greater than or equal to 90% of their needs  Not met.   MONITOR:   PO intake, Supplement acceptance, Weight trends, Skin, Labs  REASON FOR ASSESSMENT:   Consult Calorie Count  ASSESSMENT:   Pt with a PMH significant for HTN, DM, HLD, CVA presented to ED with generalized weakness and fall. Pt diagnosed with GBS, Idamae Schuller variant.  EEG revealed encephalomalacia.   Pt provided no responses to RD questions today.   Pt noted to be pocketing food and supplements. Pt also noted to be challenging for medication administration. Palliative Care met with pt and discussed GOC with the pt's sister today to discuss her poor prognosis, poor intake, poor progression with therapy, and refusal of a feeding tube. Per Palliative Care, pt's sister is ready to transition the pt to hospice services.  Pt with orders for Ensure Enlive QID and Boost Breeze TID. Discussed pt with RN who reports pt occasionally drinking supplements, but often accepting only sips. Staff should continue to offer supplements to pt, but do not force if she declines as feeding should be a source of comfort for the patient.   PO Intake: 0-10% x last 8 recorded meals (2.5% average meal intake)  UOP: 147m x24 hours documented I/O: +4,623.878msince admit  Labs: CBGs 130-183 Medications reviewed and include: Marinol,  Novolog, Chronulac, Miralax, Klor-con  Diet Order:   Diet Order            DIET - DYS 1 Room service appropriate? Yes; Fluid consistency: Thin  Diet effective now              EDUCATION NEEDS:   Not appropriate for education at this time  Skin:  Skin Assessment: Skin Integrity Issues: Skin Integrity Issues:: Other (Comment) Other: MASD groin; non-pressure wound L buttocks  Last BM:  4/27  Height:   Ht Readings from Last 1 Encounters:  08/01/19 5' 2"  (1.575 m)    Weight:   Wt Readings from Last 1 Encounters:  09/03/19 73 kg    BMI:  Body mass index is 29.44 kg/m.  Estimated Nutritional Needs:   Kcal:  176237-6283Protein:  110-125 grams  Fluid:  >1.75 L    AmLarkin InaMS, RD, LDN RD pager number and weekend/on-call pager number located in [facility]

## 2019-09-03 NOTE — Progress Notes (Signed)
Daily Progress Note   Patient Name: Denise Santiago       Date: 09/03/2019 DOB: 12-Jan-1956  Age: 64 y.o. MRN#: [id number] Attending Physician: Denise Carrow, MD Primary Care Physician: Denise Dills, MD Admit Date: 07/26/2019  Reason for Consultation/Follow-up: Establishing goals of care  Subjective: Patient wakes to voice. Oriented to person/place but with periods of confusion. She denies pain or discomfort. She tells me she ate but with 10% charted.    This NP visited with patient at bedside yesterday afternoon and patient refused ensure on ice. Also present while RN tried to give medications. Despite encouragement by nurse by placing crushed pills with applesauce and stimulation with swallowing, patient resisting and it remains challenging to get all medications administered. Per RN, trying to give 2 pills at a time and give Zen small breaks before attempting further medications. Appetite remains very poor.   GOC:  F/u with sister, Denise Santiago via telephone to discuss goals of care. Denise Santiago is familiar with palliative team this admission and has spoken with other PMT providers.   Discussed course of hospitalization including diagnoses, interventions, plan of care including EEG results revealing encephalomalacia. Shared that Denise Santiago continues to have very poor oral intake and challenging medication administration with nurses really trying to coax her to take meds. She is poorly progressing with therapy.   Denise Santiago is tearful but understands poor long-term prognosis with failure to thrive and ongoing poor oral intake. Discussed recommendation against PEG tube placement as patient has already declined PEG placement (this NP asked again today and patient tells me she would not want a feeding  tube) and PEG tube not changing quality of life following her strokes and irreversible damage that has been done. Denise Santiago agrees that the family would not want [name] to have a feeding tube and they wish to "support her wishes." Denise Santiago jokes that Denise Santiago has had her wishes documented for 35+ years and has always been organized and prepared.   Denise Santiago shares stories of Denise Santiago prior to strokes and health decline.   Discussed consideration of [facility] facility, as [name] remains with very poor oral intake and anticipation that time could be short because of this. Emphasized hospice philosophy and focus on comfort, quality of life, symptom management, and nature taking course. Explained that interventions/medications not aimed at comfort will be discontinued when  transferred to hospice facility. Denise Santiago understands and is familiar with hospice services from her mother. Denise Santiago wishes to pursue hospice facility placement and does not wish to further "force treatment" but "keep her comfortable." Denise Santiago shares that she and her sisters have "faced this reality" and "these are her wishes."   Answered questions and concerns about plan of care. Denise Santiago has PMT contact information and understands she can call with questions regarding plan.   Discussed with Denise Santiago, SW. Updated Denise Santiago and RN.    Length of Stay: 38  Current Medications: Scheduled Meds:  . antiseptic oral rinse  15 mL Mouth Rinse BID  . apixaban  5 mg Oral BID  . atorvastatin  80 mg Oral q1800  . dronabinol  2.5 mg Oral BID AC  . feeding supplement  1 Container Oral TID WC  . feeding supplement (ENSURE ENLIVE)  237 mL Oral QID  . insulin aspart  0-15 Units Subcutaneous TID WC  . insulin aspart  0-5 Units Subcutaneous QHS  . lactulose  20 g Oral BID  . latanoprost  1 drop Both Eyes QHS  . lisinopril  20 mg Oral Daily  . metoprolol tartrate  25 mg Oral BID  . nystatin  5 mL Oral QID  . OLANZapine  5 mg Oral QHS  .  polyethylene glycol  17 g Oral Daily  . potassium chloride  20 mEq Oral Daily  . sertraline  25 mg Oral Daily  . sodium chloride flush  3 mL Intravenous Once    Continuous Infusions:   PRN Meds: acetaminophen **OR** acetaminophen, ondansetron **OR** ondansetron (ZOFRAN) IV, vitamin A & D  Physical Exam Vitals and nursing note reviewed.  Constitutional:      Appearance: She is ill-appearing.  HENT:     Head: Normocephalic and atraumatic.  Cardiovascular:     Heart sounds: Normal heart sounds.  Pulmonary:     Effort: No tachypnea, accessory muscle usage or respiratory distress.  Abdominal:     Tenderness: There is no abdominal tenderness.  Skin:    General: Skin is warm and dry.  Neurological:     Mental Status: She is easily aroused.     Comments: Oriented to person/place. Periods of confusion.          Vital Signs: BP 128/88   Pulse 76   Temp 98.2 F (36.8 C)   Resp 18   Ht 5\' 2"  (1.575 m)   Wt 73 kg   SpO2 100%   BMI 29.44 kg/m  SpO2: SpO2: 100 % O2 Device: O2 Device: Room Air O2 Flow Rate:    Intake/output summary:   Intake/Output Summary (Last 24 hours) at 09/03/2019 1010 Last data filed at 09/02/2019 1700 Gross per 24 hour  Intake -  Output 100 ml  Net -100 ml   LBM: Last BM Date: 09/01/19 Baseline Weight: Weight: 83.1 kg Most recent weight: Weight: 73 kg  Palliative Care Assessment & Plan   Patient profile: 64 year old woman with prior history of hypertension, diabetes, hyperlipidemia, CVA presents to ED for generalized weakness and fall. On arrival to ED her labs were abnormal for elevated troponin, hypokalemia and hypomagnesemia. Electrolytes were all repleted and therapy evaluations are recommending SNF at this time. She continued to have generalized weakness , some dysphagia n the setting of multiple strokes. It was followed with anMRI brain,showingno evidence of acute intracranial abnormality. Stable chronic ischemic changes including  multifocal chroniccortically based infarcts within the right cerebral hemisphere and multiple chronic small vessel  lacunar infarcts.   As per the sister patient has lost more than 50 pounds since November after her last stroke.On further questioning, pt reports that she has altered taste and no appetite whatso ever, along with loose teeth , preventing her to eat any solid food. Malignancy work up has been initiated and CT abd , pelvis and chest with contrast ordered forfurther evaluation. CT of the chest with contrast showed acute bilateral pulmonary embolism and CT of the abdomen showed ascending colon colitis. She was started on IV heparin for acute PE and IV Zosyn for colitis. 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   Patient with progressive failure to thrive with poor oral intake, pocketing of food, and challenging medication administration. Poorly progressing with therapy. Palliative medicine following for goals of care.    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 Patient remains with poor oral intake, poor progression with therapy, and overall failure to thrive. Sister understands diagnoses and poor prognosis. Ready for transition to hospice services.   DNR/DNI, NO PEG tube.   SW consulted. [facility] facility referral. Sister understands hospice philosophy and that interventions/medications not aimed at comfort will be discontinued when transferred to hospice.   Comfort feeds.   Hold medications if patient pocketing or refusing.   Prognosis:  Poor prognosis likely  weeks with failure to thrive, poor oral intake patient/family declining PEG, Hx of CVA's, encephalomalacia.   Discharge Planning:  Pending [facility] hospice evaluation  Care plan was discussed with patient, sister Denise Santiago), RN, SW, Dr. Jerral Ralph  Thank you for allowing the Palliative Medicine Team to assist in the care of this patient.  Time Total:  Greater than 50% of this time was spent counseling and coordinating care related to the above assessment and plan.   Vennie Homans, DNP, FNP-C Palliative Medicine Team  Phone: [phone] Fax: [phone]  Please contact Palliative Medicine Team phone at [phone] for questions and concerns.

## 2019-09-03 NOTE — Progress Notes (Addendum)
Mews score of 3 with NT vitals, re-checked vitals and mews score is zero, this is not an acute change in patient

## 2019-09-03 NOTE — Progress Notes (Signed)
PROGRESS NOTE    Denise Santiago  GNF:[id number] DOB: [birth year] DOA: 07/26/2019 PCP: Renford Dills, MD    Brief Narrative:  64 year old female with history of hypertension, diabetes, hyperlipidemia, CVA on 12/25/2018 and 03/27/2019-has a loop recorder, history of tobacco abuse presented to ED with generalized weakness on 07/26/19. Patient reported taking Covid vaccine on Saturday, has been feeling weak lethargic, slid out of her chair onto the floor and was not able to get up 1 day PTA. EMS was called and brought to the ED as patient not able to get up from the floor. Recently lost 50 pound weight since November since her last stroke, normally lives alone uses walker at home.  Patient was found to have acute bilateral pulmonary embolism and ascending colon colitis. 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   Assessment & Plan:   Principal Problem:   Major depressive disorder Active Problems:   Essential hypertension   Type 2 diabetes mellitus without complication (HCC)   CVA (cerebral vascular accident) (HCC)   Hyperlipidemia   Generalized weakness   Hypokalemia   Hyponatremia   Acute on chronic kidney failure (HCC)   Thrombocytopenia (HCC)   Elevated troponin   Lactic acidosis   AKI (acute kidney injury) (HCC)   Palliative care by specialist   Goals of care, counseling/discussion   Xerostomia   Dry lips   Failure to thrive in adult   Physical debility   Malnutrition of moderate degree   Esophageal thrush (HCC)   Palliative care encounter   Pulmonary embolism (HCC)   DVT (deep venous thrombosis) (HCC)   Thrush, oral   Colitis  GB syndrome, Miller Fisher variant: Presented with generalized weakness and deconditioning.  Had progressive weakness.  MRI of  the head and spine negative for acute abnormalities.  Empirically treated with 5 days of IVIG. Persistent left arm and left leg weakness.  Failure to thrive/B12 deficiency/moderate protein calorie malnutrition: Progressive failure to thrive.  Currently seen by nutrition and on supplements.  She pockets her food.  Nutrition is overall poor.  Acute metabolic encephalopathy: Multifactorial in nature.  Treated empirically with high-dose thiamine and subcutaneous B12 and folic acid.  Mental status has improved.  Unknown baseline.  Still with cognitive deficits.  Bilateral pulmonary embolism/left leg acute DVT: Treated with heparin and subsequently converted to Eliquis.  Currently therapeutic on Eliquis.  Acute kidney injury on chronic kidney disease stage II: Improved.  Normalized.  Major depressive disorder with psychosis: Seen by psychiatry.  Recommend Remeron.  No indication for inpatient psych admission.  Recurrent CVA: MRI with no acute CVA.  Patient on Plavix, statin and Eliquis.  Goal of care discussion: Followed by palliative care.  Remains DNR.  PT OT consulted, recommend SNF.  She will benefit with palliative care or hospice follow-up at skilled nursing facility. Palliative care also exploring hospice options for her.   DVT prophylaxis: Eliquis Code Status: DNR Family Communication: None Disposition Plan: Status is: Inpatient  Remains inpatient appropriate because:Unsafe d/c plan   Dispo: The patient is from: Home              Anticipated d/c is to: SNF              Anticipated d/c date is: 1 day, when bed available  Patient currently is medically stable to d/c.   Consultants:   Neurology  Palliative  Procedures:   None  Antimicrobials:  Antibiotics Given (last 72 hours)    None         Subjective: Seen and examined.  No overnight events.  Patient is poor historian.  She is confused about where she is.  Denies any complaints.  Objective: Vitals:    09/03/19 0402 09/03/19 0500 09/03/19 0754 09/03/19 0800  BP: (!) 110/95  127/85 128/88  Pulse: 80  (!) 133 76  Resp: 16  18 18   Temp: 98.1 F (36.7 C)  98 F (36.7 C) 98.2 F (36.8 C)  TempSrc: Oral  Oral   SpO2: 99%  (!) 82% 100%  Weight:  73 kg    Height:        Intake/Output Summary (Last 24 hours) at 09/03/2019 1039 Last data filed at 09/02/2019 1700 Gross per 24 hour  Intake --  Output 100 ml  Net -100 ml   Filed Weights   08/30/19 0320 08/31/19 0328 09/03/19 0500  Weight: 73.9 kg 73.2 kg 73 kg    Examination:  General exam: Appears calm and comfortable, alert and awake x 1.  Chronically sick looking but not in any distress. Respiratory system: Clear to auscultation. Respiratory effort normal. Cardiovascular system: S1 & S2 heard, RRR. No JVD, murmurs, rubs, gallops or clicks. No pedal edema. Gastrointestinal system: Abdomen is nondistended, bowel sounds present. Left-sided hemiplegic, lower extremity more than upper extremity.  Data Reviewed: I have personally reviewed following labs and imaging studies  CBC: Recent Labs  Lab 08/28/19 0411 08/29/19 1309 08/30/19 2354 09/02/19 0027  WBC 5.4 5.7 6.2 6.2  NEUTROABS  --  4.0  --   --   HGB 11.5* 12.8 11.8* 12.5  HCT 35.6* 37.2 36.8 38.7  MCV 94.2 95.4 95.1 97.2  PLT 173 189 162 875   Basic Metabolic Panel: Recent Labs  Lab 08/29/19 1309  NA 141  K 3.8  CL 109  CO2 23  GLUCOSE 129*  BUN 23  CREATININE 1.00  CALCIUM 9.5   GFR: Estimated Creatinine Clearance: 53.2 mL/min (by C-G formula based on SCr of 1 mg/dL). Liver Function Tests: Recent Labs  Lab 08/29/19 1309  AST 28  ALT 25  ALKPHOS 59  BILITOT 0.9  PROT 7.4  ALBUMIN 2.8*   No results for input(s): LIPASE, AMYLASE in the last 168 hours. Recent Labs  Lab 08/29/19 1309  AMMONIA 48*   Coagulation Profile: No results for input(s): INR, PROTIME in the last 168 hours. Cardiac Enzymes: No results for input(s): CKTOTAL, CKMB,  CKMBINDEX, TROPONINI in the last 168 hours. BNP (last 3 results) No results for input(s): PROBNP in the last 8760 hours. HbA1C: No results for input(s): HGBA1C in the last 72 hours. CBG: Recent Labs  Lab 09/02/19 1133 09/02/19 1707 09/02/19 2142 09/03/19 0623 09/03/19 0758  GLUCAP 136* 183* 130* 133* 165*   Lipid Profile: No results for input(s): CHOL, HDL, LDLCALC, TRIG, CHOLHDL, LDLDIRECT in the last 72 hours. Thyroid Function Tests: No results for input(s): TSH, T4TOTAL, FREET4, T3FREE, THYROIDAB in the last 72 hours. Anemia Panel: No results for input(s): VITAMINB12, FOLATE, FERRITIN, TIBC, IRON, RETICCTPCT in the last 72 hours. Sepsis Labs: No results for input(s): PROCALCITON, LATICACIDVEN in the last 168 hours.  No results found for this or any previous visit (from the past 240 hour(s)).       Radiology Studies: No results found.  Scheduled Meds: . antiseptic oral rinse  15 mL Mouth Rinse BID  . apixaban  5 mg Oral BID  . atorvastatin  80 mg Oral q1800  . dronabinol  2.5 mg Oral BID AC  . feeding supplement  1 Container Oral TID WC  . feeding supplement (ENSURE ENLIVE)  237 mL Oral QID  . insulin aspart  0-15 Units Subcutaneous TID WC  . insulin aspart  0-5 Units Subcutaneous QHS  . lactulose  20 g Oral BID  . latanoprost  1 drop Both Eyes QHS  . lisinopril  20 mg Oral Daily  . metoprolol tartrate  25 mg Oral BID  . nystatin  5 mL Oral QID  . OLANZapine  5 mg Oral QHS  . polyethylene glycol  17 g Oral Daily  . potassium chloride  20 mEq Oral Daily  . sertraline  25 mg Oral Daily  . sodium chloride flush  3 mL Intravenous Once   Continuous Infusions:   LOS: 38 days    Time spent: 25 minutes    Dorcas Carrow, MD [facility] Pager [phone]

## 2019-09-04 LAB — CBC
HCT: 42.1 % (ref 36.0–46.0)
Hemoglobin: 13.1 g/dL (ref 12.0–15.0)
MCH: 30.1 pg (ref 26.0–34.0)
MCHC: 31.1 g/dL (ref 30.0–36.0)
MCV: 96.8 fL (ref 80.0–100.0)
Platelets: 153 10*3/uL (ref 150–400)
RBC: 4.35 MIL/uL (ref 3.87–5.11)
RDW: 17.2 % — ABNORMAL HIGH (ref 11.5–15.5)
WBC: 8.7 10*3/uL (ref 4.0–10.5)
nRBC: 0 % (ref 0.0–0.2)

## 2019-09-04 LAB — VITAMIN B1: Vitamin B1 (Thiamine): 70.7 nmol/L (ref 66.5–200.0)

## 2019-09-04 LAB — GLUCOSE, CAPILLARY
Glucose-Capillary: 159 mg/dL — ABNORMAL HIGH (ref 70–99)
Glucose-Capillary: 148 mg/dL — ABNORMAL HIGH (ref 70–99)
Glucose-Capillary: 185 mg/dL — ABNORMAL HIGH (ref 70–99)

## 2019-09-04 MED ORDER — METOPROLOL TARTRATE 25 MG PO TABS: 25.0000 mg | ORAL_TABLET | Freq: Two times a day (BID) | ORAL | Status: AC

## 2019-09-04 MED ORDER — SERTRALINE HCL 25 MG PO TABS: 25.0000 mg | ORAL_TABLET | Freq: Every day | ORAL | Status: AC

## 2019-09-04 MED ORDER — OLANZAPINE 5 MG PO TABS: 5.0000 mg | ORAL_TABLET | Freq: Every day | ORAL | Status: AC

## 2019-09-04 MED ORDER — APIXABAN 5 MG PO TABS: 5.0000 mg | ORAL_TABLET | Freq: Two times a day (BID) | ORAL | Status: AC

## 2019-09-04 NOTE — TOC Transition Note (Signed)
Transition of Care [facility]) - CM/SW Discharge Note   Patient Details  Name: [name] MRN: [id number] Date of Birth: [birth year]  [facility] CM/SW Contact:  Baldemar Lenis, LCSW Phone Number: 09/04/2019, 12:18 PM   Clinical Narrative:   Nurse to call report to [phone].   Transport set for 3:30 PM.    Final next level of care: Hospice Medical Facility Barriers to Discharge: Barriers Resolved   Patient Goals and CMS Choice Patient states their goals for this hospitalization and ongoing recovery are:: Patient agreeable to SNF in order to go home safely afterward. CMS Medicare.gov Compare Post Acute Care list provided to:: Patient Choice offered to / list presented to : Patient  Discharge Placement                Patient to be transferred to facility by: PTAR Name of family member notified: Marcelino Duster Patient and family notified of of transfer: 09/04/19  Discharge Plan and Services     Post Acute Care Choice: Skilled Nursing Facility                               Social Determinants of Health (SDOH) Interventions     Readmission Risk Interventions No flowsheet data found.

## 2019-09-04 NOTE — Progress Notes (Signed)
For 2100 CBG was 69 and was given some ensure to drink.  She took a few sips then spat out the rest and poured out the bottle all over herself and this nurse.  Her recheck was 80.  This nurse attempted 2 times to restart her IV because was inadvertently removed at shift change in the early am on 4/29.  Since was unsuccessful, IV team was ordered for IV access and it was placed in case she is unable to or unwilling to take in po.

## 2019-09-04 NOTE — Discharge Summary (Signed)
Physician Discharge Summary  Eilidh Marcano JKD:[id number] DOB: Aug 17, 1955 DOA: 07/26/2019  PCP: Renford Dills, MD  Admit date: 07/26/2019 Discharge date: 09/04/2019  Admitted From: Home Disposition: Inpatient hospice   Discharge Condition: Fair CODE STATUS: DNR Diet recommendation: Regular diet, pleasure feeding  Discharge summary:  64 year old female with history of hypertension, diabetes, hyperlipidemia, CVA on 12/25/2018 and 03/27/2019-has a loop recorder, history of tobacco abuse presented to ED with generalized weakness on 07/26/19. Patient reported taking Covid vaccine on Saturday, has been feeling weak lethargic, slid out of her chair onto the floor and was not able to get up 1 day PTA. EMS was called and brought to the ED as patient not able to get up from the floor. Recently lost 50 pound weight since November since her last stroke, normally lives alone uses walker at home.  Patient was found to have acute bilateral pulmonary embolism and ascending colon colitis. 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  # GB syndrome, Christianne Dolin variant: Presented with generalized weakness and deconditioning.  Had progressive weakness.  MRI of the head and spine negative for acute abnormalities.  Empirically treated with 5 days of IVIG. Persistent left arm and left leg weakness.  # Failure to thrive/B12 deficiency/moderate protein calorie malnutrition: Progressive failure to thrive.  Currently seen by nutrition and on supplements.  She pockets her food.  Nutrition is overall poor. No meaningful improvement after prolonged hospital stay and treatment , decided to provide hospice level of care.  # Acute metabolic encephalopathy: Multifactorial in nature.  Treated empirically  with high-dose thiamine and subcutaneous B12 and folic acid.  Mental status has been poor. Unknown baseline.    # Bilateral pulmonary embolism/left leg acute DVT: Treated with heparin and subsequently converted to Eliquis.  Currently therapeutic on Eliquis.  # Major depressive disorder with psychosis: Seen by psychiatry.  Recommend Remeron.  No indication for inpatient psych admission.  # Recurrent CVA: MRI with no acute CVA.  Patient on Plavix, statin and Eliquis.  Patient is in the hospital for 40 days, she has no meaningful improvement of her cognition as well as no good oral intake.  According to her wishes as represented by her sister, she will be provided a hospice level of care. We will continue her supportive medications including anticoagulation for now.    Discharge Diagnoses:  Principal Problem:   Major depressive disorder Active Problems:   Essential hypertension   Type 2 diabetes mellitus without complication (HCC)   CVA (cerebral vascular accident) (HCC)   Hyperlipidemia   Generalized weakness   Hypokalemia   Hyponatremia   Acute on chronic kidney failure (HCC)   Thrombocytopenia (HCC)   Elevated troponin   Lactic acidosis   AKI (acute kidney injury) (HCC)   Palliative care by specialist   Goals of care, counseling/discussion   Xerostomia   Dry lips   Failure to thrive in adult   Physical debility   Malnutrition of moderate degree   Esophageal thrush (HCC)   Palliative care encounter   Pulmonary embolism (HCC)   DVT (deep venous thrombosis) (HCC)   Thrush, oral   Colitis    Discharge Instructions  Discharge Instructions    Diet general   Complete by: As directed    Increase activity slowly   Complete by:  As directed      Allergies as of 09/04/2019      Reactions   Sulfur    Pt doesn't remember reaction      Medication List    STOP taking these medications   clopidogrel 75 MG tablet Commonly known as: PLAVIX   hydrochlorothiazide 25 MG  tablet Commonly known as: HYDRODIURIL   Investigational - Study Medication   lisinopril-hydrochlorothiazide 20-12.5 MG tablet Commonly known as: ZESTORETIC     TAKE these medications   acetaminophen 325 MG tablet Commonly known as: TYLENOL Take 2 tablets (650 mg total) by mouth every 6 (six) hours as needed for mild pain (or Fever >/= 101). What changed:   how much to take  reasons to take this   apixaban 5 MG Tabs tablet Commonly known as: ELIQUIS Take 1 tablet (5 mg total) by mouth 2 (two) times daily.   atorvastatin 80 MG tablet Commonly known as: Lipitor Take 1 tablet (80 mg total) by mouth daily.   citalopram 10 MG tablet Commonly known as: CELEXA Take 1 tablet (10 mg total) by mouth daily.   latanoprost 0.005 % ophthalmic solution Commonly known as: XALATAN Place 1 drop into both eyes at bedtime.   lisinopril 40 MG tablet Commonly known as: ZESTRIL Take 40 mg by mouth daily.   metFORMIN 850 MG tablet Commonly known as: GLUCOPHAGE Take 1 tablet (850 mg total) by mouth 2 (two) times daily with a meal.   metoprolol tartrate 25 MG tablet Commonly known as: LOPRESSOR Take 1 tablet (25 mg total) by mouth 2 (two) times daily.   OLANZapine 5 MG tablet Commonly known as: ZYPREXA Take 1 tablet (5 mg total) by mouth at bedtime.   sertraline 25 MG tablet Commonly known as: ZOLOFT Take 1 tablet (25 mg total) by mouth daily. Start taking on: Sep 05, 2019      Follow-up Information    Jake Bathe, MD Follow up on 08/14/2019.   Specialty: Cardiology Why: Hospital follow-up April 9 th at [facility] information: [address] [place] [address] [phone]          Allergies  Allergen Reactions  . Sulfur     Pt doesn't remember reaction    Consultations:  Neurology  Palliative medicine   Procedures/Studies: CT HEAD WO CONTRAST  Result Date: 08/29/2019 CLINICAL DATA:  Encephalopathy. EXAM: CT HEAD WITHOUT CONTRAST  TECHNIQUE: Contiguous axial images were obtained from the base of the skull through the vertex without intravenous contrast. COMPARISON:  MRI 08/05/2019 FINDINGS: Brain: No acute CT finding. No abnormality affects the brainstem or cerebellum. Multiple old infarctions in the right hemisphere, the largest affecting the posterior temporal and parietal lobe with smaller cortical and subcortical infarctions in the frontal operculum and posterior frontal region. Old lacunar infarctions of both basal ganglia. Chronic small-vessel changes of the white matter. No evidence of mass lesion, hemorrhage, hydrocephalus or extra-axial collection. Vascular: There is atherosclerotic calcification of the major vessels at the base of the brain. Skull: Negative Sinuses/Orbits: Clear/normal Other: None IMPRESSION: No acute finding. Multiple cortical and subcortical infarctions in the right hemisphere. Old bilateral basal ganglia infarctions. Electronically Signed   By: Paulina Fusi M.D.   On: 08/29/2019 21:10   MR BRAIN WO CONTRAST  Result Date: 08/05/2019 CLINICAL DATA:  Stroke suspected. EXAM: MRI HEAD WITHOUT CONTRAST TECHNIQUE: Multiplanar, multiecho pulse sequences of the brain and surrounding structures were obtained without intravenous contrast. COMPARISON:  Brain MRI 07/30/2019 FINDINGS: Brain: There  is no evidence of acute infarct. No evidence of intracranial mass. No midline shift or extra-axial fluid collection. Redemonstrated chronic cortical/subcortical infarcts in the posterior right cerebral hemisphere involving portions of the right temporal, occipital and parietal lobes. Redemonstrated chronic cortical/subcortical infarcts within the right frontal lobe. Unchanged chronic lacunar infarcts within the right corona radiata and bilateral basal ganglia. Stable background mild chronic small vessel ischemic disease. As before, there is chronic hemosiderin deposition associated with the right cerebral infarcts and within the  bilateral basal ganglia. Stable, mild generalized parenchymal atrophy. Vascular: Flow voids maintained within the proximal large arterial vessels. Skull and upper cervical spine: No focal marrow lesion. Sinuses/Orbits: Visualized orbits demonstrate no acute abnormality. Minimal ethmoid sinus mucosal thickening. Trace fluid within bilateral mastoid air cells. IMPRESSION: No evidence of acute intracranial abnormality, including acute infarction. Redemonstrated multifocal chronic cortical/subcortical infarcts within the right cerebral hemisphere. Stable chronic small vessel ischemic disease with multiple chronic lacunar infarcts. Mild generalized parenchymal atrophy, unchanged. Electronically Signed   By: Jackey Loge DO   On: 08/05/2019 22:44   MR CERVICAL SPINE W WO CONTRAST  Result Date: 08/07/2019 CLINICAL DATA:  Myelopathy. Weakness. EXAM: MRI CERVICAL SPINE WITHOUT AND WITH CONTRAST TECHNIQUE: Multiplanar and multiecho pulse sequences of the cervical spine, to include the craniocervical junction and cervicothoracic junction, were obtained without and with intravenous contrast. CONTRAST:  23mL GADAVIST GADOBUTROL 1 MMOL/ML IV SOLN COMPARISON:  Cervical spine CT 03/19/2019 FINDINGS: The study is severely motion degraded despite repeat imaging. Alignment: Normal. Vertebrae: No fracture. Chronically advanced disc degeneration at C5-6 and C6-7 with disc space narrowing and degenerative fatty marrow changes in addition to low level edema and patchy enhancement in the C5 and C6 vertebral bodies. No fluid signal in the C5-6 or C6-7 disc spaces or gross endplate erosion. Cord: Limited assessment due to motion artifact. No definite cord signal abnormality or abnormal enhancement. Posterior Fossa, vertebral arteries, paraspinal tissues: Small volume prevertebral fluid throughout the cervical spine with mild diffuse prevertebral enhancement. Partially visualized lipoma in the posterior right lower neck/suprascapular  region. Disc levels: Detailed characterization of degenerative changes is limited by motion. Disc degeneration is greatest at C5-6 and C6-7 where broad-based posterior disc osteophyte complexes result in mild-to-moderate spinal stenosis and likely moderate bilateral neural foraminal stenosis. There is no gross cord compression. Multilevel facet arthrosis is asymmetrically worse on the left. IMPRESSION: 1. Severely motion degraded examination. 2. Small volume prevertebral fluid and mild prevertebral enhancement throughout the cervical spine of uncertain etiology. There is low level marrow edema in the C5 and C6 vertebral bodies, however the appearance is more suggestive of degenerative changes without convincing evidence of discitis. 3. Disc degeneration greatest at C5-6 and C6-7 with suspected mild-to-moderate spinal stenosis and moderate neural foraminal stenosis. 4. No gross cervical spinal cord signal abnormality or cord compression. Electronically Signed   By: Sebastian Ache M.D.   On: 08/07/2019 12:04   MR THORACIC SPINE W WO CONTRAST  Result Date: 08/07/2019 CLINICAL DATA:  Myelopathy. Weakness. EXAM: MRI THORACIC WITHOUT AND WITH CONTRAST TECHNIQUE: Multiplanar and multiecho pulse sequences of the thoracic spine were obtained without and with intravenous contrast. CONTRAST:  33mL GADAVIST GADOBUTROL 1 MMOL/ML IV SOLN COMPARISON:  CT chest 08/01/2019 FINDINGS: The study is severely motion degraded. Alignment: Normal. Vertebrae: Diffusely heterogeneous bone marrow signal, nonspecific. No destructive osseous lesion, fracture, or evidence of discitis. Cord:  Normal cord signal and morphology. Paraspinal and other soft tissues: Trace bilateral pleural effusions. Prevertebral enhancement in the included lower cervical  spine extending to the T2 level. No paraspinal fluid collection. Disc levels: Mild thoracic spondylosis without evidence of significant spinal stenosis, spinal cord mass effect, or compressive neural  foraminal stenosis. IMPRESSION: 1. Severely motion degraded examination. 2. Prevertebral enhancement in the lower cervical and upper thoracic spine. See separate cervical spine MRI report. 3. No evidence of discitis or osteomyelitis in the thoracic spine. 4. Mild thoracic spondylosis without significant stenosis. Electronically Signed   By: Logan Bores M.D.   On: 08/07/2019 12:12   MR Lumbar Spine W Wo Contrast  Result Date: 08/07/2019 CLINICAL DATA:  Myelopathy. Weakness. EXAM: MRI LUMBAR SPINE WITHOUT AND WITH CONTRAST TECHNIQUE: Multiplanar and multiecho pulse sequences of the lumbar spine were obtained without and with intravenous contrast. CONTRAST:  10mL GADAVIST GADOBUTROL 1 MMOL/ML IV SOLN COMPARISON:  CT abdomen and pelvis 08/01/2019 FINDINGS: The study is moderately to severely motion degraded. Segmentation: Standard. Alignment:  Normal. Vertebrae: Diffusely heterogeneous bone marrow signal, nonspecific. No destructive osseous lesion, fracture, or evidence of discitis. Conus medullaris and cauda equina: Conus extends to the L2 level. Conus and cauda equina appear normal. Paraspinal and other soft tissues: No paraspinal fluid collection. Disc levels: L1-2: Minimal disc bulging and mild facet hypertrophy without stenosis. L2-3: Mild disc bulging and mild facet hypertrophy without stenosis. L3-4: Mild disc bulging and mild facet hypertrophy without stenosis. L4-5: Disc bulging slightly eccentric to the left and severe facet hypertrophy result in mild spinal stenosis and mild right and moderate left neural foraminal stenosis with potential left L4 nerve root impingement. L5-S1: Disc bulging eccentric to the left and severe facet hypertrophy result in mild right and moderate left neural foraminal stenosis with potential left L5 nerve root impingement. No spinal stenosis. IMPRESSION: 1. Moderately to severely motion degraded examination. 2. Multilevel disc and facet degeneration, worst at L4-5 where there is  mild spinal stenosis and moderate left neural foraminal stenosis. 3. Moderate left neural foraminal stenosis at L5-S1. Electronically Signed   By: Logan Bores M.D.   On: 08/07/2019 12:17   EEG adult  Result Date: 08/30/2019 Lora Havens, MD     08/30/2019  6:18 PM Patient Name: Denise Santiago MRN: [id number] Epilepsy Attending: Lora Havens Referring Physician/Provider: Dr Berle Mull Date: 08/30/2019 Duration: 23.27 mins Patient history: 64yo F with ams. EEG to evaluate for seizure. Level of alertness: awake AEDs during EEG study: None Technical aspects: This EEG study was done with scalp electrodes positioned according to the 10-20 International system of electrode placement. Electrical activity was acquired at a sampling rate of 500Hz  and reviewed with a high frequency filter of 70Hz  and a low frequency filter of 1Hz . EEG data were recorded continuously and digitally stored. DESCRIPTION:  No clear posterior dominant rhythm was seen. EEG showed continuous generalized and maximal right frontotemporal region 5-7hz  theta-delta slowing. Hyperventilation and photic stimulation were not performed. ABNORMALITY - Continuous slow, generalized and maximal right frontotemporal region IMPRESSION: This study is suggestive of non specific cortical dysfunction in right temporal region likely secondary to underlying encephalomalacia as well as mild to moderate diffuse encephalopathy, non specific to etiology.  No seizures or epileptiform discharges were seen throughout the recording. Priyanka Barbra Sarks    Subjective: Patient seen and examined.  No overnight events.  Remains confused.  Pockets food when food in the mouth.   Discharge Exam: Vitals:   09/04/19 0400 09/04/19 0901  BP: 116/70 (!) 145/76  Pulse: 72 89  Resp: 18 19  Temp: 98.3 F (36.8 C) (!)  97.4 F (36.3 C)  SpO2: 100% 100%   Vitals:   09/03/19 2006 09/03/19 2347 09/04/19 0400 09/04/19 0901  BP: (!) 105/58 120/72 116/70 (!) 145/76   Pulse: 73 78 72 89  Resp: 18 20 18 19   Temp: 98.1 F (36.7 C) 98.5 F (36.9 C) 98.3 F (36.8 C) (!) 97.4 F (36.3 C)  TempSrc: Oral  Oral Oral  SpO2: 100% 100% 100% 100%  Weight:      Height:        General: Pt is alert, awake, not in acute distress , pleasantly confused.  Occasionally follows commands but unable to have meaningful interaction.  On room air. Cardiovascular: RRR, S1/S2 +, no rubs, no gallops Respiratory: CTA bilaterally, no wheezing, no rhonchi Abdominal: Soft, NT, ND, bowel sounds + Extremities: no edema, no cyanosis Left arm and left leg with hemiparesis.  4/5.   The results of significant diagnostics from this hospitalization (including imaging, microbiology, ancillary and laboratory) are listed below for reference.     Microbiology: Recent Results (from the past 240 hour(s))  Respiratory Panel by RT PCR (Flu A&B, Covid) - Nasopharyngeal Swab     Status: None   Collection Time: 09/03/19  5:59 PM   Specimen: Nasopharyngeal Swab  Result Value Ref Range Status   SARS Coronavirus 2 by RT PCR NEGATIVE NEGATIVE Final    Comment: (NOTE) SARS-CoV-2 target nucleic acids are NOT DETECTED. The SARS-CoV-2 RNA is generally detectable in upper respiratoy specimens during the acute phase of infection. The lowest concentration of SARS-CoV-2 viral copies this assay can detect is 131 copies/mL. A negative result does not preclude SARS-Cov-2 infection and should not be used as the sole basis for treatment or other patient management decisions. A negative result may occur with  improper specimen collection/handling, submission of specimen other than nasopharyngeal swab, presence of viral mutation(s) within the areas targeted by this assay, and inadequate number of viral copies (<131 copies/mL). A negative result must be combined with clinical observations, patient history, and epidemiological information. The expected result is Negative. Fact Sheet for Patients:   https://www.moore.com/https://www.fda.gov/media/142436/download Fact Sheet for Healthcare Providers:  https://www.young.biz/https://www.fda.gov/media/142435/download This test is not yet ap proved or cleared by the [place] FDA and  has been authorized for detection and/or diagnosis of SARS-CoV-2 by FDA under an Emergency Use Authorization (EUA). This EUA will remain  in effect (meaning this test can be used) for the duration of the COVID-19 declaration under Section 564(b)(1) of the Act, 21 U.S.C. section 360bbb-3(b)(1), unless the authorization is terminated or revoked sooner.    Influenza A by PCR NEGATIVE NEGATIVE Final   Influenza B by PCR NEGATIVE NEGATIVE Final    Comment: (NOTE) The Xpert Xpress SARS-CoV-2/FLU/RSV assay is intended as an aid in  the diagnosis of influenza from Nasopharyngeal swab specimens and  should not be used as a sole basis for treatment. Nasal washings and  aspirates are unacceptable for Xpert Xpress SARS-CoV-2/FLU/RSV  testing. Fact Sheet for Patients: https://www.moore.com/https://www.fda.gov/media/142436/download Fact Sheet for Healthcare Providers: https://www.young.biz/https://www.fda.gov/media/142435/download This test is not yet approved or cleared by the [place] FDA and  has been authorized for detection and/or diagnosis of SARS-CoV-2 by  FDA under an Emergency Use Authorization (EUA). This EUA will remain  in effect (meaning this test can be used) for the duration of the  Covid-19 declaration under Section 564(b)(1) of the Act, 21  U.S.C. section 360bbb-3(b)(1), unless the authorization is  terminated or revoked. Performed at [facility], [address]  [place], [place] [address]      Labs: BNP (last 3 results) Recent Labs    07/26/19 0810  BNP 64.2   Basic Metabolic Panel: Recent Labs  Lab 08/29/19 1309  NA 141  K 3.8  CL 109  CO2 23  GLUCOSE 129*  BUN 23  CREATININE 1.00  CALCIUM 9.5   Liver Function Tests: Recent Labs  Lab 08/29/19 1309  AST 28  ALT 25  ALKPHOS 59  BILITOT 0.9   PROT 7.4  ALBUMIN 2.8*   No results for input(s): LIPASE, AMYLASE in the last 168 hours. Recent Labs  Lab 08/29/19 1309  AMMONIA 48*   CBC: Recent Labs  Lab 08/29/19 1309 08/30/19 2354 09/02/19 0027 09/03/19 2352  WBC 5.7 6.2 6.2 8.7  NEUTROABS 4.0  --   --   --   HGB 12.8 11.8* 12.5 13.1  HCT 37.2 36.8 38.7 42.1  MCV 95.4 95.1 97.2 96.8  PLT 189 162 169 153   Cardiac Enzymes: No results for input(s): CKTOTAL, CKMB, CKMBINDEX, TROPONINI in the last 168 hours. BNP: Invalid input(s): POCBNP CBG: Recent Labs  Lab 09/03/19 1200 09/03/19 1528 09/03/19 2124 09/03/19 2145 09/04/19 0601  GLUCAP 155* 241* 69* 80 159*   D-Dimer No results for input(s): DDIMER in the last 72 hours. Hgb A1c No results for input(s): HGBA1C in the last 72 hours. Lipid Profile No results for input(s): CHOL, HDL, LDLCALC, TRIG, CHOLHDL, LDLDIRECT in the last 72 hours. Thyroid function studies No results for input(s): TSH, T4TOTAL, T3FREE, THYROIDAB in the last 72 hours.  Invalid input(s): FREET3 Anemia work up No results for input(s): VITAMINB12, FOLATE, FERRITIN, TIBC, IRON, RETICCTPCT in the last 72 hours. Urinalysis    Component Value Date/Time   COLORURINE RED (A) 08/13/2019 1106   APPEARANCEUR TURBID (A) 08/13/2019 1106   LABSPEC  08/13/2019 1106    TEST NOT REPORTED DUE TO COLOR INTERFERENCE OF URINE PIGMENT   PHURINE  08/13/2019 1106    TEST NOT REPORTED DUE TO COLOR INTERFERENCE OF URINE PIGMENT   GLUCOSEU (A) 08/13/2019 1106    TEST NOT REPORTED DUE TO COLOR INTERFERENCE OF URINE PIGMENT   HGBUR (A) 08/13/2019 1106    TEST NOT REPORTED DUE TO COLOR INTERFERENCE OF URINE PIGMENT   BILIRUBINUR (A) 08/13/2019 1106    TEST NOT REPORTED DUE TO COLOR INTERFERENCE OF URINE PIGMENT   KETONESUR (A) 08/13/2019 1106    TEST NOT REPORTED DUE TO COLOR INTERFERENCE OF URINE PIGMENT   PROTEINUR (A) 08/13/2019 1106    TEST NOT REPORTED DUE TO COLOR INTERFERENCE OF URINE PIGMENT    NITRITE (A) 08/13/2019 1106    TEST NOT REPORTED DUE TO COLOR INTERFERENCE OF URINE PIGMENT   LEUKOCYTESUR (A) 08/13/2019 1106    TEST NOT REPORTED DUE TO COLOR INTERFERENCE OF URINE PIGMENT   Sepsis Labs Invalid input(s): PROCALCITONIN,  WBC,  LACTICIDVEN Microbiology Recent Results (from the past 240 hour(s))  Respiratory Panel by RT PCR (Flu A&B, Covid) - Nasopharyngeal Swab     Status: None   Collection Time: 09/03/19  5:59 PM   Specimen: Nasopharyngeal Swab  Result Value Ref Range Status   SARS Coronavirus 2 by RT PCR NEGATIVE NEGATIVE Final    Comment: (NOTE) SARS-CoV-2 target nucleic acids are NOT DETECTED. The SARS-CoV-2 RNA is generally detectable in upper respiratoy specimens during the acute phase of infection. The lowest concentration of SARS-CoV-2 viral copies this assay can detect is 131 copies/mL. A negative result does not preclude SARS-Cov-2 infection  and should not be used as the sole basis for treatment or other patient management decisions. A negative result may occur with  improper specimen collection/handling, submission of specimen other than nasopharyngeal swab, presence of viral mutation(s) within the areas targeted by this assay, and inadequate number of viral copies (<131 copies/mL). A negative result must be combined with clinical observations, patient history, and epidemiological information. The expected result is Negative. Fact Sheet for Patients:  https://www.moore.com/ Fact Sheet for Healthcare Providers:  https://www.young.biz/ This test is not yet ap proved or cleared by the [place] FDA and  has been authorized for detection and/or diagnosis of SARS-CoV-2 by FDA under an Emergency Use Authorization (EUA). This EUA will remain  in effect (meaning this test can be used) for the duration of the COVID-19 declaration under Section 564(b)(1) of the Act, 21 U.S.C. section 360bbb-3(b)(1), unless the  authorization is terminated or revoked sooner.    Influenza A by PCR NEGATIVE NEGATIVE Final   Influenza B by PCR NEGATIVE NEGATIVE Final    Comment: (NOTE) The Xpert Xpress SARS-CoV-2/FLU/RSV assay is intended as an aid in  the diagnosis of influenza from Nasopharyngeal swab specimens and  should not be used as a sole basis for treatment. Nasal washings and  aspirates are unacceptable for Xpert Xpress SARS-CoV-2/FLU/RSV  testing. Fact Sheet for Patients: https://www.moore.com/ Fact Sheet for Healthcare Providers: https://www.young.biz/ This test is not yet approved or cleared by the [place] FDA and  has been authorized for detection and/or diagnosis of SARS-CoV-2 by  FDA under an Emergency Use Authorization (EUA). This EUA will remain  in effect (meaning this test can be used) for the duration of the  Covid-19 declaration under Section 564(b)(1) of the Act, 21  U.S.C. section 360bbb-3(b)(1), unless the authorization is  terminated or revoked. Performed at [facility], [address], [place], [place] [address]      Time coordinating discharge: 35 minutes  SIGNED:   Dorcas Carrow, MD  [facility] 09/04/2019, 10:21 AM

## 2019-09-04 NOTE — Progress Notes (Signed)
Still waiting for [facility] to transport patient

## 2019-09-04 NOTE — Progress Notes (Signed)
Report called to [facility] at [facility]

## 2019-10-06 DEATH — deceased

## 2019-10-08 ENCOUNTER — Ambulatory Visit (INDEPENDENT_AMBULATORY_CARE_PROVIDER_SITE_OTHER): Payer: Self-pay

## 2019-10-08 DIAGNOSIS — I639 Cerebral infarction, unspecified: Secondary | ICD-10-CM

## 2019-10-13 NOTE — Progress Notes (Signed)
Carelink Summary Report / Loop Recorder 

## 2019-12-10 ENCOUNTER — Ambulatory Visit: Payer: Self-pay | Admitting: Neurology

## 2019-12-16 ENCOUNTER — Telehealth: Payer: Self-pay | Admitting: *Deleted

## 2019-12-16 ENCOUNTER — Encounter: Payer: Self-pay | Admitting: Neurology

## 2019-12-16 ENCOUNTER — Ambulatory Visit: Payer: Self-pay | Admitting: Neurology

## 2019-12-16 NOTE — Telephone Encounter (Signed)
Patient was no show for follow up today. 
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
## 2019-12-21 ENCOUNTER — Ambulatory Visit: Payer: Self-pay | Admitting: Neurology
